# Patient Record
Sex: Female | Born: 1955 | Race: White | Hispanic: No | Smoking: Former smoker
Health system: Southern US, Community
[De-identification: ages and names within clinical notes are randomized; demographics above are authoritative.]

## PROBLEM LIST (undated history)

## (undated) DIAGNOSIS — Z923 Personal history of irradiation: Secondary | ICD-10-CM

## (undated) DIAGNOSIS — E785 Hyperlipidemia, unspecified: Secondary | ICD-10-CM

## (undated) DIAGNOSIS — C801 Malignant (primary) neoplasm, unspecified: Secondary | ICD-10-CM

## (undated) DIAGNOSIS — K219 Gastro-esophageal reflux disease without esophagitis: Secondary | ICD-10-CM

## (undated) DIAGNOSIS — R079 Chest pain, unspecified: Secondary | ICD-10-CM

## (undated) DIAGNOSIS — D869 Sarcoidosis, unspecified: Secondary | ICD-10-CM

## (undated) DIAGNOSIS — I10 Essential (primary) hypertension: Secondary | ICD-10-CM

## (undated) DIAGNOSIS — R0600 Dyspnea, unspecified: Secondary | ICD-10-CM

## (undated) DIAGNOSIS — E079 Disorder of thyroid, unspecified: Secondary | ICD-10-CM

## (undated) DIAGNOSIS — R0609 Other forms of dyspnea: Secondary | ICD-10-CM

## (undated) HISTORY — DX: Personal history of irradiation: Z92.3

## (undated) HISTORY — PX: ABDOMINAL HYSTERECTOMY: SHX81

## (undated) HISTORY — DX: Malignant (primary) neoplasm, unspecified: C80.1

## (undated) HISTORY — DX: Chest pain, unspecified: R07.9

## (undated) HISTORY — DX: Other forms of dyspnea: R06.09

## (undated) HISTORY — DX: Essential (primary) hypertension: I10

## (undated) HISTORY — DX: Hyperlipidemia, unspecified: E78.5

## (undated) HISTORY — DX: Dyspnea, unspecified: R06.00

---

## 2004-06-17 ENCOUNTER — Ambulatory Visit: Payer: Self-pay

## 2004-06-20 ENCOUNTER — Ambulatory Visit: Payer: Self-pay

## 2006-07-01 ENCOUNTER — Encounter: Admission: RE | Admit: 2006-07-01 | Discharge: 2006-07-01 | Payer: Self-pay | Admitting: Family Medicine

## 2006-10-11 ENCOUNTER — Emergency Department (HOSPITAL_COMMUNITY): Admission: EM | Admit: 2006-10-11 | Discharge: 2006-10-11 | Payer: Self-pay | Admitting: Emergency Medicine

## 2007-08-03 ENCOUNTER — Encounter: Admission: RE | Admit: 2007-08-03 | Discharge: 2007-08-03 | Payer: Self-pay | Admitting: Family Medicine

## 2007-08-17 ENCOUNTER — Ambulatory Visit: Payer: Self-pay | Admitting: Cardiology

## 2007-08-30 ENCOUNTER — Ambulatory Visit: Payer: Self-pay

## 2007-08-30 ENCOUNTER — Encounter: Payer: Self-pay | Admitting: Cardiology

## 2008-01-11 ENCOUNTER — Encounter (INDEPENDENT_AMBULATORY_CARE_PROVIDER_SITE_OTHER): Payer: Self-pay | Admitting: Gastroenterology

## 2008-01-11 ENCOUNTER — Ambulatory Visit (HOSPITAL_COMMUNITY): Admission: RE | Admit: 2008-01-11 | Discharge: 2008-01-11 | Payer: Self-pay | Admitting: Gastroenterology

## 2008-04-20 HISTORY — PX: BREAST LUMPECTOMY: SHX2

## 2008-08-08 ENCOUNTER — Emergency Department (HOSPITAL_COMMUNITY): Admission: EM | Admit: 2008-08-08 | Discharge: 2008-08-08 | Payer: Self-pay | Admitting: Emergency Medicine

## 2008-10-02 ENCOUNTER — Ambulatory Visit: Payer: Self-pay | Admitting: Internal Medicine

## 2008-10-17 ENCOUNTER — Ambulatory Visit: Payer: Self-pay | Admitting: Internal Medicine

## 2008-11-14 ENCOUNTER — Ambulatory Visit: Payer: Self-pay | Admitting: Internal Medicine

## 2009-01-31 ENCOUNTER — Encounter: Admission: RE | Admit: 2009-01-31 | Discharge: 2009-01-31 | Payer: Self-pay | Admitting: Family Medicine

## 2009-02-05 ENCOUNTER — Ambulatory Visit: Payer: Self-pay | Admitting: Sports Medicine

## 2009-02-05 DIAGNOSIS — R269 Unspecified abnormalities of gait and mobility: Secondary | ICD-10-CM | POA: Insufficient documentation

## 2009-02-05 DIAGNOSIS — M722 Plantar fascial fibromatosis: Secondary | ICD-10-CM | POA: Insufficient documentation

## 2009-02-20 ENCOUNTER — Encounter (INDEPENDENT_AMBULATORY_CARE_PROVIDER_SITE_OTHER): Payer: Self-pay | Admitting: *Deleted

## 2009-04-20 DIAGNOSIS — Z9221 Personal history of antineoplastic chemotherapy: Secondary | ICD-10-CM

## 2009-04-20 DIAGNOSIS — Z923 Personal history of irradiation: Secondary | ICD-10-CM

## 2009-04-20 HISTORY — DX: Personal history of antineoplastic chemotherapy: Z92.21

## 2009-04-20 HISTORY — DX: Personal history of irradiation: Z92.3

## 2009-06-18 ENCOUNTER — Ambulatory Visit: Payer: Self-pay | Admitting: Sports Medicine

## 2010-02-06 ENCOUNTER — Encounter: Admission: RE | Admit: 2010-02-06 | Discharge: 2010-02-06 | Payer: Self-pay | Admitting: Family Medicine

## 2010-02-13 ENCOUNTER — Encounter (INDEPENDENT_AMBULATORY_CARE_PROVIDER_SITE_OTHER): Payer: Self-pay | Admitting: *Deleted

## 2010-02-19 ENCOUNTER — Encounter: Admission: RE | Admit: 2010-02-19 | Discharge: 2010-02-19 | Payer: Self-pay | Admitting: Family Medicine

## 2010-02-19 DIAGNOSIS — C801 Malignant (primary) neoplasm, unspecified: Secondary | ICD-10-CM | POA: Insufficient documentation

## 2010-02-19 HISTORY — DX: Malignant (primary) neoplasm, unspecified: C80.1

## 2010-02-25 ENCOUNTER — Ambulatory Visit (HOSPITAL_COMMUNITY): Admission: RE | Admit: 2010-02-25 | Discharge: 2010-02-25 | Payer: Self-pay | Admitting: Family Medicine

## 2010-03-04 ENCOUNTER — Ambulatory Visit: Payer: Self-pay | Admitting: Oncology

## 2010-03-17 ENCOUNTER — Ambulatory Visit: Payer: Self-pay | Admitting: Internal Medicine

## 2010-03-17 ENCOUNTER — Encounter: Payer: Self-pay | Admitting: Oncology

## 2010-03-17 ENCOUNTER — Ambulatory Visit (HOSPITAL_COMMUNITY): Admission: RE | Admit: 2010-03-17 | Discharge: 2010-03-17 | Payer: Self-pay | Admitting: Oncology

## 2010-03-17 ENCOUNTER — Ambulatory Visit: Payer: Self-pay

## 2010-03-25 ENCOUNTER — Ambulatory Visit (HOSPITAL_COMMUNITY)
Admission: RE | Admit: 2010-03-25 | Discharge: 2010-03-26 | Payer: Self-pay | Source: Home / Self Care | Attending: General Surgery | Admitting: General Surgery

## 2010-03-25 ENCOUNTER — Encounter (INDEPENDENT_AMBULATORY_CARE_PROVIDER_SITE_OTHER): Payer: Self-pay | Admitting: General Surgery

## 2010-03-25 ENCOUNTER — Encounter: Admission: RE | Admit: 2010-03-25 | Discharge: 2010-03-25 | Payer: Self-pay | Admitting: General Surgery

## 2010-03-25 HISTORY — PX: DEEP AXILLARY SENTINEL NODE BIOPSY / EXCISION: SUR130

## 2010-03-25 HISTORY — PX: BREAST SURGERY: SHX581

## 2010-04-09 ENCOUNTER — Ambulatory Visit: Payer: Self-pay | Admitting: Oncology

## 2010-04-15 ENCOUNTER — Ambulatory Visit (HOSPITAL_COMMUNITY)
Admission: RE | Admit: 2010-04-15 | Discharge: 2010-04-15 | Payer: Self-pay | Source: Home / Self Care | Attending: Oncology | Admitting: Oncology

## 2010-04-18 HISTORY — PX: PORTACATH PLACEMENT: SHX2246

## 2010-04-18 LAB — CBC WITH DIFFERENTIAL/PLATELET
BASO%: 0.4 % (ref 0.0–2.0)
Basophils Absolute: 0 10*3/uL (ref 0.0–0.1)
EOS%: 2.2 % (ref 0.0–7.0)
HCT: 42.9 % (ref 34.8–46.6)
HGB: 14.7 g/dL (ref 11.6–15.9)
LYMPH%: 25.6 % (ref 14.0–49.7)
MCH: 26.6 pg (ref 25.1–34.0)
MCHC: 34.3 g/dL (ref 31.5–36.0)
MCV: 77.6 fL — ABNORMAL LOW (ref 79.5–101.0)
NEUT%: 68.4 % (ref 38.4–76.8)
Platelets: 242 10*3/uL (ref 145–400)
lymph#: 1.7 10*3/uL (ref 0.9–3.3)

## 2010-04-25 ENCOUNTER — Ambulatory Visit (HOSPITAL_BASED_OUTPATIENT_CLINIC_OR_DEPARTMENT_OTHER): Payer: 59 | Admitting: Oncology

## 2010-04-25 LAB — COMPREHENSIVE METABOLIC PANEL
ALT: 51 U/L — ABNORMAL HIGH (ref 0–35)
AST: 28 U/L (ref 0–37)
Albumin: 4.2 g/dL (ref 3.5–5.2)
Alkaline Phosphatase: 58 U/L (ref 39–117)
BUN: 14 mg/dL (ref 6–23)
CO2: 24 mEq/L (ref 19–32)
Calcium: 9.1 mg/dL (ref 8.4–10.5)
Chloride: 103 mEq/L (ref 96–112)
Creatinine, Ser: 0.71 mg/dL (ref 0.40–1.20)
Glucose, Bld: 135 mg/dL — ABNORMAL HIGH (ref 70–99)
Potassium: 4.2 mEq/L (ref 3.5–5.3)
Sodium: 138 mEq/L (ref 135–145)
Total Bilirubin: 0.3 mg/dL (ref 0.3–1.2)
Total Protein: 6.2 g/dL (ref 6.0–8.3)

## 2010-04-25 LAB — CBC WITH DIFFERENTIAL/PLATELET
BASO%: 0.6 % (ref 0.0–2.0)
Basophils Absolute: 0 10*3/uL (ref 0.0–0.1)
EOS%: 4.4 % (ref 0.0–7.0)
Eosinophils Absolute: 0.2 10*3/uL (ref 0.0–0.5)
HCT: 41.2 % (ref 34.8–46.6)
HGB: 14.2 g/dL (ref 11.6–15.9)
LYMPH%: 26.7 % (ref 14.0–49.7)
MCH: 26.6 pg (ref 25.1–34.0)
MCHC: 34.5 g/dL (ref 31.5–36.0)
MCV: 77.2 fL — ABNORMAL LOW (ref 79.5–101.0)
MONO#: 0.1 10*3/uL (ref 0.1–0.9)
MONO%: 2.7 % (ref 0.0–14.0)
NEUT#: 3.1 10*3/uL (ref 1.5–6.5)
NEUT%: 65.6 % (ref 38.4–76.8)
Platelets: 216 10*3/uL (ref 145–400)
RBC: 5.34 10*6/uL (ref 3.70–5.45)
RDW: 14.2 % (ref 11.2–14.5)
WBC: 4.8 10*3/uL (ref 3.9–10.3)
lymph#: 1.3 10*3/uL (ref 0.9–3.3)

## 2010-05-02 LAB — CBC WITH DIFFERENTIAL/PLATELET
BASO%: 0.9 % (ref 0.0–2.0)
Basophils Absolute: 0 10*3/uL (ref 0.0–0.1)
EOS%: 2.3 % (ref 0.0–7.0)
Eosinophils Absolute: 0.1 10*3/uL (ref 0.0–0.5)
HCT: 38.3 % (ref 34.8–46.6)
HGB: 13.1 g/dL (ref 11.6–15.9)
LYMPH%: 31.1 % (ref 14.0–49.7)
MCH: 26.7 pg (ref 25.1–34.0)
MCHC: 34.2 g/dL (ref 31.5–36.0)
MCV: 78 fL — ABNORMAL LOW (ref 79.5–101.0)
MONO#: 0.2 10*3/uL (ref 0.1–0.9)
MONO%: 5 % (ref 0.0–14.0)
NEUT#: 2.7 10*3/uL (ref 1.5–6.5)
NEUT%: 60.7 % (ref 38.4–76.8)
Platelets: 263 10*3/uL (ref 145–400)
RBC: 4.91 10*6/uL (ref 3.70–5.45)
RDW: 14.3 % (ref 11.2–14.5)
WBC: 4.4 10*3/uL (ref 3.9–10.3)
lymph#: 1.4 10*3/uL (ref 0.9–3.3)

## 2010-05-02 LAB — BASIC METABOLIC PANEL
BUN: 13 mg/dL (ref 6–23)
CO2: 26 mEq/L (ref 19–32)
Calcium: 9 mg/dL (ref 8.4–10.5)
Chloride: 103 mEq/L (ref 96–112)
Creatinine, Ser: 0.72 mg/dL (ref 0.40–1.20)
Glucose, Bld: 114 mg/dL — ABNORMAL HIGH (ref 70–99)
Potassium: 3.7 mEq/L (ref 3.5–5.3)
Sodium: 137 mEq/L (ref 135–145)

## 2010-05-09 LAB — CBC WITH DIFFERENTIAL/PLATELET
BASO%: 1.8 % (ref 0.0–2.0)
Basophils Absolute: 0.1 10*3/uL (ref 0.0–0.1)
EOS%: 2.5 % (ref 0.0–7.0)
Eosinophils Absolute: 0.1 10*3/uL (ref 0.0–0.5)
HCT: 40 % (ref 34.8–46.6)
HGB: 13.6 g/dL (ref 11.6–15.9)
LYMPH%: 27.1 % (ref 14.0–49.7)
MCH: 26.6 pg (ref 25.1–34.0)
MCHC: 34 g/dL (ref 31.5–36.0)
MCV: 78.3 fL — ABNORMAL LOW (ref 79.5–101.0)
MONO#: 0.3 10*3/uL (ref 0.1–0.9)
MONO%: 4.9 % (ref 0.0–14.0)
NEUT#: 3.3 10*3/uL (ref 1.5–6.5)
NEUT%: 63.7 % (ref 38.4–76.8)
Platelets: 217 10*3/uL (ref 145–400)
RBC: 5.11 10*6/uL (ref 3.70–5.45)
RDW: 14.8 % — ABNORMAL HIGH (ref 11.2–14.5)
WBC: 5.1 10*3/uL (ref 3.9–10.3)
lymph#: 1.4 10*3/uL (ref 0.9–3.3)
nRBC: 0 % (ref 0–0)

## 2010-05-09 LAB — COMPREHENSIVE METABOLIC PANEL
ALT: 39 U/L — ABNORMAL HIGH (ref 0–35)
AST: 20 U/L (ref 0–37)
Albumin: 4 g/dL (ref 3.5–5.2)
Alkaline Phosphatase: 53 U/L (ref 39–117)
BUN: 14 mg/dL (ref 6–23)
CO2: 25 mEq/L (ref 19–32)
Calcium: 9.1 mg/dL (ref 8.4–10.5)
Chloride: 104 mEq/L (ref 96–112)
Creatinine, Ser: 0.65 mg/dL (ref 0.40–1.20)
Glucose, Bld: 108 mg/dL — ABNORMAL HIGH (ref 70–99)
Potassium: 4.1 mEq/L (ref 3.5–5.3)
Sodium: 138 mEq/L (ref 135–145)
Total Bilirubin: 0.3 mg/dL (ref 0.3–1.2)
Total Protein: 5.9 g/dL — ABNORMAL LOW (ref 6.0–8.3)

## 2010-05-09 LAB — TECHNOLOGIST REVIEW

## 2010-05-16 LAB — CBC WITH DIFFERENTIAL/PLATELET
Eosinophils Absolute: 0.1 10*3/uL (ref 0.0–0.5)
HCT: 36.8 % (ref 34.8–46.6)
HGB: 12.6 g/dL (ref 11.6–15.9)
LYMPH%: 18.6 % (ref 14.0–49.7)
MONO#: 0.3 10*3/uL (ref 0.1–0.9)
NEUT#: 4.4 10*3/uL (ref 1.5–6.5)
NEUT%: 75.4 % (ref 38.4–76.8)
Platelets: 295 10*3/uL (ref 145–400)
WBC: 5.9 10*3/uL (ref 3.9–10.3)

## 2010-05-16 LAB — COMPREHENSIVE METABOLIC PANEL
ALT: 43 U/L — ABNORMAL HIGH (ref 0–35)
BUN: 17 mg/dL (ref 6–23)
CO2: 23 mEq/L (ref 19–32)
Calcium: 9.2 mg/dL (ref 8.4–10.5)
Chloride: 104 mEq/L (ref 96–112)
Creatinine, Ser: 0.81 mg/dL (ref 0.40–1.20)

## 2010-05-16 LAB — TECHNOLOGIST REVIEW

## 2010-05-22 NOTE — Letter (Signed)
Summary: Out of Work  [facility]  [address]   [place], [place] [address]   Phone: [phone]  Fax: [phone]    February 13, 2010   Employee:  ERION HERMAN[employer] [facility]    To Whom It May Concern:   For Medical reasons please allow employee to wear tennis shoes at work through March 2012, and possibly indefinitely.  She will be following up here at our office at that time.   If you need additional information, please feel free to contact our office.         Sincerely,    Dr. Enid Baas

## 2010-05-22 NOTE — Assessment & Plan Note (Signed)
Summary: F/U FOOT,MC   Vital Signs:  Patient profile:   55 year old female Height:      63 inches Weight:      155 pounds BMI:     27.56 BP sitting:   149 / 95  Vitals Entered By: Lillia Pauls CMA (June 18, 2009 11:08 AM)  History of Present Illness: Pt presents for follow-up of bilateral plantar fasciitis, left worse than right. Original exercises, stretching, scaphoid pads and arch binders were helpful at the beginning but she has She has been doing the exercises daily.  Recently developed worsening pain but cannot identify any new changes in her routine or injuries Very painful to step on her left foot in the morning. Notices quite a bit of throbbing and has to take Tylenol as needed pain She has been icing regularly which helps   Allergies (verified): No Known Drug Allergies  Physical Exam  General:  alert and well-developed.   Head:  normocephalic and atraumatic.   Neck:  supple.   Lungs:  normal respiratory effort.   Msk:  Left Foo and Anklet: Normal inspection Full ROM of the ankle + severe TTP along the medial plantar fascia recreating her pain No TTP along the navicular, achilles tendon, medial or lateral maleoli Neg calcaneal squeeze test Difficulty bearing full weight easily 5/5 strength with resisted ankle ROM pes cavus  Right Foot and Ankle: Normal inspection Full ROM of the ankle + moderate TTP along the medial plantar fascia recreating her pain No TTP along the navicular, achilles tendon, medial or lateral maleoli Neg calcaneal squeeze test Difficulty bearing full weight easily 5/5 strength with resisted ankle ROM pes cavus   Additional Exam:  U/S of the left achilles tendon shows significant edema and a partial tear of the plantar fascia. No bone spurs noted. Thickness was 0.96cm. U/S of the right achilles tendon shows mild edema and a partial tear of the plantar fascia. No bone spurs noted. Thickness was 0.68cm. Images saved for documentation.     Impression & Recommendations:  Problem # 1:  PLANTAR FASCIITIS, BILATERAL (ICD-728.71)  Since plantar fasciitis is getting worse, made custom orthotics for the patient.  Patient was fitted for a : standard, cushioned, semi-rigid orthotic. The orthotic was heated and afterward the patient stood on the orthotic blank positioned on the orthotic stand. The patient was positioned in subtalar neutral position and 10 degrees of ankle dorsiflexion in a weight bearing stance. After completion of molding, a stable base was applied to the orthotic blank. The blank was ground to a stable position for weight bearing. Size: 7 red Base: medium blue eva Posting: arch support Additional orthotic padding: none  Use orthotics at all time. Continue stretches, icing and strengthening exercises Return in 6 weeks for follow-up  Orders: [place] LIMITED (16109) Orthotic Materials, each unit (U0454)  Problem # 2:  ABNORMALITY OF GAIT (ICD-781.2)  Orthotics were made because of painful arches and bilateral pes cavus  Orders: [place] LIMITED ([id number]) Orthotic Materials, each unit (B1478)

## 2010-05-23 ENCOUNTER — Encounter (HOSPITAL_BASED_OUTPATIENT_CLINIC_OR_DEPARTMENT_OTHER): Payer: Commercial Managed Care - PPO | Admitting: Oncology

## 2010-05-23 DIAGNOSIS — C50319 Malignant neoplasm of lower-inner quadrant of unspecified female breast: Secondary | ICD-10-CM

## 2010-05-23 DIAGNOSIS — Z5111 Encounter for antineoplastic chemotherapy: Secondary | ICD-10-CM

## 2010-05-23 DIAGNOSIS — Z5112 Encounter for antineoplastic immunotherapy: Secondary | ICD-10-CM

## 2010-05-23 LAB — CBC WITH DIFFERENTIAL/PLATELET
BASO%: 2 % (ref 0.0–2.0)
HCT: 41 % (ref 34.8–46.6)
MCHC: 33.9 g/dL (ref 31.5–36.0)
MONO#: 0.4 10*3/uL (ref 0.1–0.9)
NEUT%: 67 % (ref 38.4–76.8)
RBC: 5.12 10*6/uL (ref 3.70–5.45)
RDW: 15.8 % — ABNORMAL HIGH (ref 11.2–14.5)
WBC: 7.7 10*3/uL (ref 3.9–10.3)
lymph#: 1.9 10*3/uL (ref 0.9–3.3)

## 2010-05-23 LAB — COMPREHENSIVE METABOLIC PANEL
ALT: 23 U/L (ref 0–35)
AST: 15 U/L (ref 0–37)
Albumin: 4.6 g/dL (ref 3.5–5.2)
Calcium: 9.8 mg/dL (ref 8.4–10.5)
Chloride: 101 mEq/L (ref 96–112)
Potassium: 3.5 mEq/L (ref 3.5–5.3)
Sodium: 140 mEq/L (ref 135–145)
Total Protein: 6.6 g/dL (ref 6.0–8.3)

## 2010-05-23 LAB — TECHNOLOGIST REVIEW

## 2010-05-30 ENCOUNTER — Other Ambulatory Visit: Payer: Self-pay | Admitting: Physician Assistant

## 2010-05-30 ENCOUNTER — Encounter (HOSPITAL_BASED_OUTPATIENT_CLINIC_OR_DEPARTMENT_OTHER): Payer: Commercial Managed Care - PPO | Admitting: Oncology

## 2010-05-30 DIAGNOSIS — C50319 Malignant neoplasm of lower-inner quadrant of unspecified female breast: Secondary | ICD-10-CM

## 2010-05-30 DIAGNOSIS — Z5111 Encounter for antineoplastic chemotherapy: Secondary | ICD-10-CM

## 2010-05-30 DIAGNOSIS — Z5112 Encounter for antineoplastic immunotherapy: Secondary | ICD-10-CM

## 2010-05-30 LAB — CBC WITH DIFFERENTIAL/PLATELET
BASO%: 0.5 % (ref 0.0–2.0)
EOS%: 2.1 % (ref 0.0–7.0)
MCH: 28.2 pg (ref 25.1–34.0)
MCHC: 34.7 g/dL (ref 31.5–36.0)
MCV: 81.3 fL (ref 79.5–101.0)
MONO%: 4.7 % (ref 0.0–14.0)
RDW: 17.3 % — ABNORMAL HIGH (ref 11.2–14.5)
lymph#: 1.2 10*3/uL (ref 0.9–3.3)

## 2010-05-30 LAB — COMPREHENSIVE METABOLIC PANEL
ALT: 25 U/L (ref 0–35)
AST: 14 U/L (ref 0–37)
Albumin: 4.2 g/dL (ref 3.5–5.2)
Alkaline Phosphatase: 46 U/L (ref 39–117)
Calcium: 9.4 mg/dL (ref 8.4–10.5)
Chloride: 106 mEq/L (ref 96–112)
Creatinine, Ser: 0.73 mg/dL (ref 0.40–1.20)
Potassium: 4 mEq/L (ref 3.5–5.3)

## 2010-06-05 ENCOUNTER — Ambulatory Visit: Payer: 59 | Admitting: Radiation Oncology

## 2010-06-06 ENCOUNTER — Other Ambulatory Visit: Payer: Self-pay | Admitting: Oncology

## 2010-06-06 ENCOUNTER — Ambulatory Visit: Payer: 59 | Attending: Radiation Oncology | Admitting: Radiation Oncology

## 2010-06-06 ENCOUNTER — Encounter (HOSPITAL_BASED_OUTPATIENT_CLINIC_OR_DEPARTMENT_OTHER): Payer: 59 | Admitting: Oncology

## 2010-06-06 DIAGNOSIS — C50319 Malignant neoplasm of lower-inner quadrant of unspecified female breast: Secondary | ICD-10-CM

## 2010-06-06 DIAGNOSIS — Z90710 Acquired absence of both cervix and uterus: Secondary | ICD-10-CM | POA: Insufficient documentation

## 2010-06-06 DIAGNOSIS — Z8052 Family history of malignant neoplasm of bladder: Secondary | ICD-10-CM | POA: Insufficient documentation

## 2010-06-06 DIAGNOSIS — Z171 Estrogen receptor negative status [ER-]: Secondary | ICD-10-CM | POA: Insufficient documentation

## 2010-06-06 DIAGNOSIS — Z5111 Encounter for antineoplastic chemotherapy: Secondary | ICD-10-CM

## 2010-06-06 DIAGNOSIS — Z510 Encounter for antineoplastic radiation therapy: Secondary | ICD-10-CM | POA: Insufficient documentation

## 2010-06-06 DIAGNOSIS — C50919 Malignant neoplasm of unspecified site of unspecified female breast: Secondary | ICD-10-CM | POA: Insufficient documentation

## 2010-06-06 DIAGNOSIS — Z9079 Acquired absence of other genital organ(s): Secondary | ICD-10-CM | POA: Insufficient documentation

## 2010-06-06 DIAGNOSIS — Z5112 Encounter for antineoplastic immunotherapy: Secondary | ICD-10-CM

## 2010-06-06 DIAGNOSIS — Z79899 Other long term (current) drug therapy: Secondary | ICD-10-CM | POA: Insufficient documentation

## 2010-06-06 LAB — CBC WITH DIFFERENTIAL/PLATELET
BASO%: 0.6 % (ref 0.0–2.0)
Basophils Absolute: 0 10*3/uL (ref 0.0–0.1)
EOS%: 2.1 % (ref 0.0–7.0)
HCT: 37.8 % (ref 34.8–46.6)
HGB: 13.4 g/dL (ref 11.6–15.9)
LYMPH%: 18 % (ref 14.0–49.7)
MCH: 28.7 pg (ref 25.1–34.0)
MCHC: 35.4 g/dL (ref 31.5–36.0)
MCV: 81.2 fL (ref 79.5–101.0)
MONO%: 2.9 % (ref 0.0–14.0)
NEUT%: 76.4 % (ref 38.4–76.8)
lymph#: 1 10*3/uL (ref 0.9–3.3)

## 2010-06-06 LAB — COMPREHENSIVE METABOLIC PANEL
ALT: 23 U/L (ref 0–35)
AST: 12 U/L (ref 0–37)
Alkaline Phosphatase: 47 U/L (ref 39–117)
BUN: 14 mg/dL (ref 6–23)
Calcium: 8.8 mg/dL (ref 8.4–10.5)
Chloride: 105 mEq/L (ref 96–112)
Creatinine, Ser: 0.66 mg/dL (ref 0.40–1.20)
Total Bilirubin: 0.3 mg/dL (ref 0.3–1.2)

## 2010-06-13 ENCOUNTER — Other Ambulatory Visit: Payer: Self-pay | Admitting: Oncology

## 2010-06-13 ENCOUNTER — Encounter (HOSPITAL_BASED_OUTPATIENT_CLINIC_OR_DEPARTMENT_OTHER): Payer: PRIVATE HEALTH INSURANCE | Admitting: Oncology

## 2010-06-13 DIAGNOSIS — Z171 Estrogen receptor negative status [ER-]: Secondary | ICD-10-CM

## 2010-06-13 DIAGNOSIS — Z5111 Encounter for antineoplastic chemotherapy: Secondary | ICD-10-CM

## 2010-06-13 DIAGNOSIS — Z5112 Encounter for antineoplastic immunotherapy: Secondary | ICD-10-CM

## 2010-06-13 DIAGNOSIS — C50319 Malignant neoplasm of lower-inner quadrant of unspecified female breast: Secondary | ICD-10-CM

## 2010-06-13 LAB — CBC WITH DIFFERENTIAL/PLATELET
Basophils Absolute: 0.1 10*3/uL (ref 0.0–0.1)
EOS%: 2.9 % (ref 0.0–7.0)
Eosinophils Absolute: 0.2 10*3/uL (ref 0.0–0.5)
LYMPH%: 18.1 % (ref 14.0–49.7)
MCH: 27.9 pg (ref 25.1–34.0)
MCV: 80 fL (ref 79.5–101.0)
MONO%: 4.5 % (ref 0.0–14.0)
NEUT#: 3.7 10*3/uL (ref 1.5–6.5)
Platelets: 263 10*3/uL (ref 145–400)
RBC: 4.81 10*6/uL (ref 3.70–5.45)
RDW: 17 % — ABNORMAL HIGH (ref 11.2–14.5)
nRBC: 0 % (ref 0–0)

## 2010-06-13 LAB — COMPREHENSIVE METABOLIC PANEL
Alkaline Phosphatase: 48 U/L (ref 39–117)
CO2: 23 mEq/L (ref 19–32)
Creatinine, Ser: 0.84 mg/dL (ref 0.40–1.20)
Glucose, Bld: 143 mg/dL — ABNORMAL HIGH (ref 70–99)
Total Bilirubin: 0.5 mg/dL (ref 0.3–1.2)

## 2010-06-17 ENCOUNTER — Other Ambulatory Visit: Payer: Self-pay | Admitting: Radiation Oncology

## 2010-06-20 ENCOUNTER — Encounter (HOSPITAL_BASED_OUTPATIENT_CLINIC_OR_DEPARTMENT_OTHER): Payer: Commercial Managed Care - PPO | Admitting: Oncology

## 2010-06-20 ENCOUNTER — Other Ambulatory Visit: Payer: Self-pay | Admitting: Physician Assistant

## 2010-06-20 DIAGNOSIS — Z171 Estrogen receptor negative status [ER-]: Secondary | ICD-10-CM

## 2010-06-20 DIAGNOSIS — Z5112 Encounter for antineoplastic immunotherapy: Secondary | ICD-10-CM

## 2010-06-20 DIAGNOSIS — Z5111 Encounter for antineoplastic chemotherapy: Secondary | ICD-10-CM

## 2010-06-20 DIAGNOSIS — C50319 Malignant neoplasm of lower-inner quadrant of unspecified female breast: Secondary | ICD-10-CM

## 2010-06-20 LAB — CBC WITH DIFFERENTIAL/PLATELET
Eosinophils Absolute: 0.1 10*3/uL (ref 0.0–0.5)
HCT: 36.9 % (ref 34.8–46.6)
LYMPH%: 15.2 % (ref 14.0–49.7)
MCHC: 34.4 g/dL (ref 31.5–36.0)
MCV: 81.6 fL (ref 79.5–101.0)
MONO#: 0.4 10*3/uL (ref 0.1–0.9)
MONO%: 7.2 % (ref 0.0–14.0)
NEUT%: 74.4 % (ref 38.4–76.8)
Platelets: 288 10*3/uL (ref 145–400)
RBC: 4.52 10*6/uL (ref 3.70–5.45)
WBC: 5.1 10*3/uL (ref 3.9–10.3)

## 2010-06-20 LAB — TECHNOLOGIST REVIEW

## 2010-06-27 ENCOUNTER — Other Ambulatory Visit: Payer: Self-pay | Admitting: Physician Assistant

## 2010-06-27 ENCOUNTER — Ambulatory Visit (HOSPITAL_COMMUNITY): Payer: 59 | Attending: Oncology

## 2010-06-27 ENCOUNTER — Encounter (HOSPITAL_BASED_OUTPATIENT_CLINIC_OR_DEPARTMENT_OTHER): Payer: 59 | Admitting: Oncology

## 2010-06-27 ENCOUNTER — Encounter: Payer: Self-pay | Admitting: Cardiology

## 2010-06-27 DIAGNOSIS — C50319 Malignant neoplasm of lower-inner quadrant of unspecified female breast: Secondary | ICD-10-CM

## 2010-06-27 DIAGNOSIS — I428 Other cardiomyopathies: Secondary | ICD-10-CM | POA: Insufficient documentation

## 2010-06-27 DIAGNOSIS — Z5112 Encounter for antineoplastic immunotherapy: Secondary | ICD-10-CM

## 2010-06-27 DIAGNOSIS — I059 Rheumatic mitral valve disease, unspecified: Secondary | ICD-10-CM | POA: Insufficient documentation

## 2010-06-27 DIAGNOSIS — Z5111 Encounter for antineoplastic chemotherapy: Secondary | ICD-10-CM

## 2010-06-27 LAB — CBC WITH DIFFERENTIAL/PLATELET
BASO%: 0.9 % (ref 0.0–2.0)
Basophils Absolute: 0.1 10*3/uL (ref 0.0–0.1)
EOS%: 1.3 % (ref 0.0–7.0)
HCT: 38.3 % (ref 34.8–46.6)
HGB: 13 g/dL (ref 11.6–15.9)
LYMPH%: 13.5 % — ABNORMAL LOW (ref 14.0–49.7)
MCH: 28.2 pg (ref 25.1–34.0)
MCHC: 33.9 g/dL (ref 31.5–36.0)
MCV: 83.1 fL (ref 79.5–101.0)
MONO%: 11.2 % (ref 0.0–14.0)
NEUT%: 73.1 % (ref 38.4–76.8)

## 2010-07-01 LAB — DIFFERENTIAL
Eosinophils Relative: 3 % (ref 0–5)
Lymphocytes Relative: 22 % (ref 12–46)
Lymphs Abs: 1.9 10*3/uL (ref 0.7–4.0)
Monocytes Relative: 7 % (ref 3–12)

## 2010-07-01 LAB — URINALYSIS, ROUTINE W REFLEX MICROSCOPIC
Glucose, UA: NEGATIVE mg/dL
Nitrite: NEGATIVE
Protein, ur: NEGATIVE mg/dL
Urobilinogen, UA: 0.2 mg/dL (ref 0.0–1.0)

## 2010-07-01 LAB — COMPREHENSIVE METABOLIC PANEL
AST: 18 U/L (ref 0–37)
Albumin: 4.3 g/dL (ref 3.5–5.2)
CO2: 30 mEq/L (ref 19–32)
Calcium: 9.5 mg/dL (ref 8.4–10.5)
Creatinine, Ser: 0.83 mg/dL (ref 0.4–1.2)
GFR calc Af Amer: >60 mL/min (ref >60)
GFR calc non Af Amer: >60 mL/min (ref >60)
Sodium: 138 mEq/L (ref 135–145)
Total Protein: 6.7 g/dL (ref 6.0–8.3)

## 2010-07-01 LAB — SURGICAL PCR SCREEN: MRSA, PCR: NEGATIVE

## 2010-07-01 LAB — CBC
MCH: 26.7 pg (ref 26.0–34.0)
MCHC: 33.8 g/dL (ref 30.0–36.0)
Platelets: 263 10*3/uL (ref 150–400)
RDW: 14.1 % (ref 11.5–15.5)

## 2010-07-04 ENCOUNTER — Encounter (HOSPITAL_BASED_OUTPATIENT_CLINIC_OR_DEPARTMENT_OTHER): Payer: 59 | Admitting: Oncology

## 2010-07-04 ENCOUNTER — Other Ambulatory Visit: Payer: Self-pay | Admitting: Physician Assistant

## 2010-07-04 DIAGNOSIS — C50319 Malignant neoplasm of lower-inner quadrant of unspecified female breast: Secondary | ICD-10-CM

## 2010-07-04 DIAGNOSIS — Z171 Estrogen receptor negative status [ER-]: Secondary | ICD-10-CM

## 2010-07-04 DIAGNOSIS — Z5111 Encounter for antineoplastic chemotherapy: Secondary | ICD-10-CM

## 2010-07-04 DIAGNOSIS — Z5112 Encounter for antineoplastic immunotherapy: Secondary | ICD-10-CM

## 2010-07-04 LAB — CBC WITH DIFFERENTIAL/PLATELET
BASO%: 0.6 % (ref 0.0–2.0)
EOS%: 1.4 % (ref 0.0–7.0)
HCT: 40.8 % (ref 34.8–46.6)
MCH: 28.3 pg (ref 25.1–34.0)
MCHC: 33.8 g/dL (ref 31.5–36.0)
MONO#: 0.6 10*3/uL (ref 0.1–0.9)
RBC: 4.88 10*6/uL (ref 3.70–5.45)
RDW: 16.4 % — ABNORMAL HIGH (ref 11.2–14.5)
WBC: 7.8 10*3/uL (ref 3.9–10.3)
lymph#: 1.2 10*3/uL (ref 0.9–3.3)
nRBC: 0 % (ref 0–0)

## 2010-07-11 ENCOUNTER — Other Ambulatory Visit: Payer: Self-pay | Admitting: Physician Assistant

## 2010-07-11 ENCOUNTER — Encounter (HOSPITAL_BASED_OUTPATIENT_CLINIC_OR_DEPARTMENT_OTHER): Payer: 59 | Admitting: Oncology

## 2010-07-11 DIAGNOSIS — C50319 Malignant neoplasm of lower-inner quadrant of unspecified female breast: Secondary | ICD-10-CM

## 2010-07-11 DIAGNOSIS — Z5112 Encounter for antineoplastic immunotherapy: Secondary | ICD-10-CM

## 2010-07-11 DIAGNOSIS — Z171 Estrogen receptor negative status [ER-]: Secondary | ICD-10-CM

## 2010-07-11 DIAGNOSIS — Z5111 Encounter for antineoplastic chemotherapy: Secondary | ICD-10-CM

## 2010-07-11 LAB — CBC WITH DIFFERENTIAL/PLATELET
Basophils Absolute: 0.1 10*3/uL (ref 0.0–0.1)
Eosinophils Absolute: 0.3 10*3/uL (ref 0.0–0.5)
HGB: 14.5 g/dL (ref 11.6–15.9)
NEUT#: 6.7 10*3/uL — ABNORMAL HIGH (ref 1.5–6.5)
RBC: 5.11 10*6/uL (ref 3.70–5.45)
RDW: 15.5 % — ABNORMAL HIGH (ref 11.2–14.5)
WBC: 8.4 10*3/uL (ref 3.9–10.3)
lymph#: 0.8 10*3/uL — ABNORMAL LOW (ref 0.9–3.3)
nRBC: 0 % (ref 0–0)

## 2010-07-25 ENCOUNTER — Other Ambulatory Visit: Payer: Self-pay | Admitting: Physician Assistant

## 2010-07-25 ENCOUNTER — Encounter (HOSPITAL_BASED_OUTPATIENT_CLINIC_OR_DEPARTMENT_OTHER): Payer: 59 | Admitting: Oncology

## 2010-07-25 DIAGNOSIS — Z5112 Encounter for antineoplastic immunotherapy: Secondary | ICD-10-CM

## 2010-07-25 DIAGNOSIS — Z171 Estrogen receptor negative status [ER-]: Secondary | ICD-10-CM

## 2010-07-25 DIAGNOSIS — C50319 Malignant neoplasm of lower-inner quadrant of unspecified female breast: Secondary | ICD-10-CM

## 2010-07-25 DIAGNOSIS — Z5111 Encounter for antineoplastic chemotherapy: Secondary | ICD-10-CM

## 2010-07-25 LAB — CBC WITH DIFFERENTIAL/PLATELET
BASO%: 0.9 % (ref 0.0–2.0)
Eosinophils Absolute: 0.2 10*3/uL (ref 0.0–0.5)
HCT: 40.4 % (ref 34.8–46.6)
LYMPH%: 15.6 % (ref 14.0–49.7)
MCHC: 33.9 g/dL (ref 31.5–36.0)
MONO#: 0.5 10*3/uL (ref 0.1–0.9)
NEUT#: 4.7 10*3/uL (ref 1.5–6.5)
Platelets: 229 10*3/uL (ref 145–400)
RBC: 4.83 10*6/uL (ref 3.70–5.45)
WBC: 6.5 10*3/uL (ref 3.9–10.3)
lymph#: 1 10*3/uL (ref 0.9–3.3)

## 2010-07-25 LAB — COMPREHENSIVE METABOLIC PANEL
ALT: 50 U/L — ABNORMAL HIGH (ref 0–35)
Albumin: 3.8 g/dL (ref 3.5–5.2)
CO2: 28 mEq/L (ref 19–32)
Calcium: 9.1 mg/dL (ref 8.4–10.5)
Chloride: 105 mEq/L (ref 96–112)
Glucose, Bld: 89 mg/dL (ref 70–99)
Sodium: 140 mEq/L (ref 135–145)
Total Bilirubin: 0.6 mg/dL (ref 0.3–1.2)
Total Protein: 6.4 g/dL (ref 6.0–8.3)

## 2010-08-01 ENCOUNTER — Other Ambulatory Visit: Payer: Self-pay | Admitting: Physician Assistant

## 2010-08-01 ENCOUNTER — Encounter (HOSPITAL_BASED_OUTPATIENT_CLINIC_OR_DEPARTMENT_OTHER): Payer: 59 | Admitting: Oncology

## 2010-08-01 DIAGNOSIS — Z5111 Encounter for antineoplastic chemotherapy: Secondary | ICD-10-CM

## 2010-08-01 DIAGNOSIS — C50319 Malignant neoplasm of lower-inner quadrant of unspecified female breast: Secondary | ICD-10-CM

## 2010-08-01 DIAGNOSIS — Z171 Estrogen receptor negative status [ER-]: Secondary | ICD-10-CM

## 2010-08-01 DIAGNOSIS — Z5112 Encounter for antineoplastic immunotherapy: Secondary | ICD-10-CM

## 2010-08-01 LAB — CBC WITH DIFFERENTIAL/PLATELET
BASO%: 0.2 % (ref 0.0–2.0)
Eosinophils Absolute: 0.1 10*3/uL (ref 0.0–0.5)
LYMPH%: 14.5 % (ref 14.0–49.7)
MCHC: 34 g/dL (ref 31.5–36.0)
MONO#: 0.2 10*3/uL (ref 0.1–0.9)
NEUT#: 3.7 10*3/uL (ref 1.5–6.5)
RBC: 4.89 10*6/uL (ref 3.70–5.45)
RDW: 14.6 % — ABNORMAL HIGH (ref 11.2–14.5)
WBC: 4.7 10*3/uL (ref 3.9–10.3)
lymph#: 0.7 10*3/uL — ABNORMAL LOW (ref 0.9–3.3)

## 2010-08-04 ENCOUNTER — Other Ambulatory Visit: Payer: Self-pay | Admitting: Radiation Oncology

## 2010-08-04 ENCOUNTER — Encounter: Payer: 59 | Admitting: Oncology

## 2010-08-04 LAB — RESEARCH LABS

## 2010-08-08 ENCOUNTER — Other Ambulatory Visit: Payer: Self-pay | Admitting: Physician Assistant

## 2010-08-08 ENCOUNTER — Encounter (HOSPITAL_BASED_OUTPATIENT_CLINIC_OR_DEPARTMENT_OTHER): Payer: 59 | Admitting: Oncology

## 2010-08-08 DIAGNOSIS — C50319 Malignant neoplasm of lower-inner quadrant of unspecified female breast: Secondary | ICD-10-CM

## 2010-08-08 DIAGNOSIS — Z5112 Encounter for antineoplastic immunotherapy: Secondary | ICD-10-CM

## 2010-08-08 DIAGNOSIS — Z5111 Encounter for antineoplastic chemotherapy: Secondary | ICD-10-CM

## 2010-08-08 DIAGNOSIS — Z171 Estrogen receptor negative status [ER-]: Secondary | ICD-10-CM

## 2010-08-08 LAB — COMPREHENSIVE METABOLIC PANEL
ALT: 26 U/L (ref 0–35)
AST: 18 U/L (ref 0–37)
Albumin: 3.8 g/dL (ref 3.5–5.2)
BUN: 12 mg/dL (ref 6–23)
CO2: 28 mEq/L (ref 19–32)
Calcium: 9 mg/dL (ref 8.4–10.5)
Chloride: 106 mEq/L (ref 96–112)
Potassium: 3.9 mEq/L (ref 3.5–5.3)

## 2010-08-08 LAB — CBC WITH DIFFERENTIAL/PLATELET
Basophils Absolute: 0 10*3/uL (ref 0.0–0.1)
EOS%: 2.7 % (ref 0.0–7.0)
HCT: 41.3 % (ref 34.8–46.6)
HGB: 14.1 g/dL (ref 11.6–15.9)
MCH: 28.4 pg (ref 25.1–34.0)
MONO#: 0.4 10*3/uL (ref 0.1–0.9)
NEUT#: 3.5 10*3/uL (ref 1.5–6.5)
RDW: 13.2 % (ref 11.2–14.5)
WBC: 5.1 10*3/uL (ref 3.9–10.3)
lymph#: 1.1 10*3/uL (ref 0.9–3.3)

## 2010-08-29 ENCOUNTER — Ambulatory Visit: Payer: 59 | Attending: Radiation Oncology | Admitting: Radiation Oncology

## 2010-08-29 ENCOUNTER — Encounter (HOSPITAL_BASED_OUTPATIENT_CLINIC_OR_DEPARTMENT_OTHER): Payer: 59 | Admitting: Oncology

## 2010-08-29 ENCOUNTER — Other Ambulatory Visit: Payer: Self-pay | Admitting: Physician Assistant

## 2010-08-29 DIAGNOSIS — Z171 Estrogen receptor negative status [ER-]: Secondary | ICD-10-CM

## 2010-08-29 DIAGNOSIS — Z5112 Encounter for antineoplastic immunotherapy: Secondary | ICD-10-CM

## 2010-08-29 DIAGNOSIS — C50319 Malignant neoplasm of lower-inner quadrant of unspecified female breast: Secondary | ICD-10-CM

## 2010-08-29 LAB — COMPREHENSIVE METABOLIC PANEL
ALT: 31 U/L (ref 0–35)
AST: 20 U/L (ref 0–37)
Albumin: 4.3 g/dL (ref 3.5–5.2)
Calcium: 9.2 mg/dL (ref 8.4–10.5)
Chloride: 104 mEq/L (ref 96–112)
Potassium: 4.2 mEq/L (ref 3.5–5.3)
Sodium: 141 mEq/L (ref 135–145)
Total Protein: 6.3 g/dL (ref 6.0–8.3)

## 2010-08-29 LAB — CBC WITH DIFFERENTIAL/PLATELET
BASO%: 0.9 % (ref 0.0–2.0)
EOS%: 2.4 % (ref 0.0–7.0)
HGB: 14.6 g/dL (ref 11.6–15.9)
MCH: 28.2 pg (ref 25.1–34.0)
MCHC: 34.4 g/dL (ref 31.5–36.0)
RDW: 12.6 % (ref 11.2–14.5)
WBC: 5.4 10*3/uL (ref 3.9–10.3)
lymph#: 1.3 10*3/uL (ref 0.9–3.3)

## 2010-09-02 NOTE — Op Note (Signed)
NAME:  Tracy Barnes, Tracy Barnes              ACCOUNT NO.:  [id number]   MEDICAL RECORD NO.:  [id number]          PATIENT TYPE:  AMB   LOCATION:  ENDO                         FACILITY:  [facility]   PHYSICIAN:  Anselmo Rod, M.D.  DATE OF BIRTH:  [birth year]   DATE OF PROCEDURE:  01/11/2008  DATE OF DISCHARGE:  01/11/2008                               OPERATIVE REPORT   PROCEDURE PERFORMED:  Colonoscopy with multiple random colonic biopsies.   ENDOSCOPIST:  Anselmo Rod, M.D.   INSTRUMENT USED:  Pentax video colonoscope.   INDICATIONS FOR PROCEDURE:  A 55 year old white female with history of a  change in bowel habits undergoing screening colonoscopy to rule out  colonic polyps, masses, etc.   PRE PROCEDURE PREPARATION:  Informed consent was procured from the  patient.  The patient fasted for 8 hours prior to the procedure and  prepped with a bottle of magnesium citrate and a gallon of NuLytely the  night prior to the procedure.  Risks and benefits of the procedure  including a 10% missed rate of cancer and polyp were discussed with the  patient as well.   PRE PROCEDURE PHYSICAL:  The patient had stable vital signs.  Neck  supple.  Chest clear to auscultation.  S1, S2 regular.  Abdomen soft  with normal bowel sounds.   DESCRIPTION OF PROCEDURE:  The patient was placed in the left lateral  decubitus position and sedated with 75 mcg of Fentanyl and 6 mg of  Versed given intravenously in slow incremental doses.  Once the patient  was adequately sedated and maintained on low-flow oxygen and continuous  cardiac monitoring the Pentax video colonoscope was advanced from the  rectum to the cecum.  Small sessile polyp was removed by cold biopsy  from the right colon.  There was evidence of sigmoid diverticulosis.  TI  erosions were biopsied as well to rule out IBD.  Random colonic biopsies  were done to rule out microscopic versus lymphocytic colitis.  Retroflexion in the rectum revealed no  abnormalities.  The patient  tolerated the procedure well without immediate complications.   IMPRESSION:  1. Scattered sigmoid diverticula.  2. Small sessile polyp removed by cold biopsy from the right colon.  3. Erosions in the terminal ileum biopsied for pathology.  4. Random colonic biopsies done to rule out microscopic versus      lymphocytic colitis.   RECOMMENDATIONS:  1. Await pathology results.  2. Avoid all nonsteroidals including aspirin for the next 2 weeks.  3. Repeat colonoscopy depending on pathology results.  4. Outpatient followup if need arises in the future.  5. Importance of a high-fiber diet with liberal fluid intake has been      emphasized.      Anselmo Rod, M.D.  Electronically Signed     JNM/MEDQ  D:  01/13/2008  T:  01/14/2008  Job:  [id number]

## 2010-09-02 NOTE — Assessment & Plan Note (Signed)
[facility]                            CARDIOLOGY OFFICE NOTE   NAME:Butz, LEANN MAYWEATHER                     MRN:          [id number]  DATE:08/17/2007                            DOB:          12/03/1955    HISTORY OF PRESENT ILLNESS:  Ms. Reitman is a pleasant 55 year old female who I am asked to evaluate  for an abnormal electrocardiogram and dyspnea.  She has no prior cardiac  history.  Over the past 6 months, she has noticed that she gets dyspneic  with more moderate activities such as walking a mile or climbing stairs.  This is unusual for her.  Note there is no orthopnea or PND.  She can  occasionally have mild pedal edema for the past 6 months, as well.  Note, she has not had exertional chest pain, presyncope or syncope.  She  occasionally feels her heart skipping, but this does not appear to be  significantly bothersome.  She was seen by Dr. Elease Hashimoto.  Electrocardiogram was felt to be different from previous and abnormal.  We were asked to further evaluate.   MEDICATIONS:  Premarin.   ALLERGY:  CODEINE.   SOCIAL HISTORY:  She does not smoke nor does she consume alcohol.   FAMILY HISTORY:  Negative for coronary artery disease.   PAST MEDICAL HISTORY:  There is no diabetes mellitus, hypertension or  hyperlipidemia.  There is no other medical problems noted.  She has had  a prior hysterectomy secondary to fibroids as well as an appendectomy.   REVIEW OF SYSTEMS:  She denies any headaches or fevers, chills.  There  is no productive cough or hemoptysis.  There is no dysphagia,  odynophagia, melena or hematochezia.  There is no dysuria or hematuria.  There are no rashes or seizure activity.  There is no orthopnea, PND.  She does occasionally have mild pedal edema by her report.  This has  been present for 6 months.  She also occasionally has some arthralgias  in the knees.  The remaining systems are negative other than a weight  gain  approximately 10 pounds over the past one year.   PHYSICAL EXAMINATION:  VITAL SIGNS:  Blood pressure 130/80.  Her pulse  is 80.  She weighs 177 pounds.  She is well-developed, somewhat obese.  GENERAL:  She is in no acute distress at present.  Skin is warm and dry.  She does not appear depressed.  There is no peripheral clubbing.  Her  back is normal.  HEENT:  Normal with no mileage.  NECK:  Supple with normal upstroke bilaterally.  No bruits noted.  There  is no jugular distention and no thyromegaly is noted.  CHEST:  Clear to auscultation, no expansion.  CARDIOVASCULAR:  Regular rhythm.  Normal S1-S2.  There is no murmurs,  rubs or gallops noted.  There is no change in Valsalva.  ABDOMEN:  Nontender, nondistended.  Positive bowel sounds.  No  hepatosplenomegaly.  No mass appreciated.  There is no abdominal bruit.  She has 2+ femoral pulses bilaterally.  No bruits.  EXTREMITIES:  Show no edema. I  can palpate no cords.  She has 2+  posterior tibial pulses bilaterally.  NEUROLOGICAL:  Grossly intact.   STUDIES:  Electrocardiogram from Dr. Santiago Bur office shows a sinus  rhythm at a rate of 78.  There is left ventricle hypertrophy with  nonspecific ST changes.   DIAGNOSES:  1. Dyspnea.  The etiology of this is unclear.  She does not have      classic ischemic symptoms, but we will plan to proceed with stress      echocardiogram both to quantify LV function and also to exclude      ischemia.  I think if this is normal then she will most likely not      need further cardiac workup.  Note, there is no significant history      of tobacco use.  She also has had no recent travel or leg injury,      and I think pulmonary embolus is unlikely.  Some of this may be      deconditioning in size.  If her stress echocardiogram is normal,      then I will ask her to follow up with Dr. Elease Hashimoto for further      management.  2. Abnormal electrocardiogram.  She does have left ventricle       hypertrophy, biatrial enlargement and nonspecific ST changes on      electrocardiogram.  The echocardiogram will help [place] to better      assess her LV dimensions, left atrial dimensions and left      ventricular wall thickness.  She apparently does not have a history      of hypertension.   I will see back on as-needed basis pending the results of her stress  echo.     Madolyn Frieze Jens Som, MD, [facility]  Electronically Signed    BSC/MedQ  DD: 08/17/2007  DT: 08/17/2007  Job #: [id number]   cc:   Lorie Phenix

## 2010-09-19 ENCOUNTER — Encounter (INDEPENDENT_AMBULATORY_CARE_PROVIDER_SITE_OTHER): Payer: Self-pay | Admitting: General Surgery

## 2010-09-19 ENCOUNTER — Encounter (HOSPITAL_BASED_OUTPATIENT_CLINIC_OR_DEPARTMENT_OTHER): Payer: 59 | Admitting: Oncology

## 2010-09-19 ENCOUNTER — Other Ambulatory Visit: Payer: Self-pay | Admitting: Physician Assistant

## 2010-09-19 DIAGNOSIS — D229 Melanocytic nevi, unspecified: Secondary | ICD-10-CM | POA: Insufficient documentation

## 2010-09-19 DIAGNOSIS — Z973 Presence of spectacles and contact lenses: Secondary | ICD-10-CM | POA: Insufficient documentation

## 2010-09-19 DIAGNOSIS — N63 Unspecified lump in unspecified breast: Secondary | ICD-10-CM | POA: Insufficient documentation

## 2010-09-19 DIAGNOSIS — Z5112 Encounter for antineoplastic immunotherapy: Secondary | ICD-10-CM

## 2010-09-19 DIAGNOSIS — Z171 Estrogen receptor negative status [ER-]: Secondary | ICD-10-CM

## 2010-09-19 DIAGNOSIS — Z79890 Hormone replacement therapy (postmenopausal): Secondary | ICD-10-CM

## 2010-09-19 DIAGNOSIS — D0510 Intraductal carcinoma in situ of unspecified breast: Secondary | ICD-10-CM

## 2010-09-19 DIAGNOSIS — C50319 Malignant neoplasm of lower-inner quadrant of unspecified female breast: Secondary | ICD-10-CM

## 2010-09-19 DIAGNOSIS — Z780 Asymptomatic menopausal state: Secondary | ICD-10-CM | POA: Insufficient documentation

## 2010-09-19 LAB — COMPREHENSIVE METABOLIC PANEL
ALT: 34 U/L (ref 0–35)
AST: 15 U/L (ref 0–37)
Calcium: 9.7 mg/dL (ref 8.4–10.5)
Chloride: 106 mEq/L (ref 96–112)
Creatinine, Ser: 0.7 mg/dL (ref 0.50–1.10)

## 2010-09-19 LAB — CBC WITH DIFFERENTIAL/PLATELET
BASO%: 0.8 % (ref 0.0–2.0)
EOS%: 4.4 % (ref 0.0–7.0)
HCT: 41.9 % (ref 34.8–46.6)
MCH: 27.7 pg (ref 25.1–34.0)
MCHC: 34.1 g/dL (ref 31.5–36.0)
NEUT%: 63.8 % (ref 38.4–76.8)
RDW: 12.7 % (ref 11.2–14.5)
lymph#: 1.3 10*3/uL (ref 0.9–3.3)

## 2010-09-24 ENCOUNTER — Ambulatory Visit (HOSPITAL_COMMUNITY)
Admission: RE | Admit: 2010-09-24 | Discharge: 2010-09-24 | Disposition: A | Discharge status: Home / Self Care | Payer: 59 | Source: Ambulatory Visit | Attending: Oncology | Admitting: Oncology

## 2010-09-24 DIAGNOSIS — I059 Rheumatic mitral valve disease, unspecified: Secondary | ICD-10-CM

## 2010-09-24 DIAGNOSIS — C50919 Malignant neoplasm of unspecified site of unspecified female breast: Secondary | ICD-10-CM | POA: Insufficient documentation

## 2010-09-24 DIAGNOSIS — Z01818 Encounter for other preprocedural examination: Secondary | ICD-10-CM | POA: Insufficient documentation

## 2010-10-10 ENCOUNTER — Other Ambulatory Visit: Payer: Self-pay | Admitting: Physician Assistant

## 2010-10-10 ENCOUNTER — Encounter (HOSPITAL_BASED_OUTPATIENT_CLINIC_OR_DEPARTMENT_OTHER): Payer: 59 | Admitting: Oncology

## 2010-10-10 DIAGNOSIS — Z5112 Encounter for antineoplastic immunotherapy: Secondary | ICD-10-CM

## 2010-10-10 DIAGNOSIS — Z171 Estrogen receptor negative status [ER-]: Secondary | ICD-10-CM

## 2010-10-10 DIAGNOSIS — Z5111 Encounter for antineoplastic chemotherapy: Secondary | ICD-10-CM

## 2010-10-10 DIAGNOSIS — C50319 Malignant neoplasm of lower-inner quadrant of unspecified female breast: Secondary | ICD-10-CM

## 2010-10-10 LAB — CBC WITH DIFFERENTIAL/PLATELET
BASO%: 0.7 % (ref 0.0–2.0)
EOS%: 3 % (ref 0.0–7.0)
HCT: 41.3 % (ref 34.8–46.6)
LYMPH%: 20.1 % (ref 14.0–49.7)
MCH: 27.1 pg (ref 25.1–34.0)
MCHC: 33.9 g/dL (ref 31.5–36.0)
MCV: 79.9 fL (ref 79.5–101.0)
MONO%: 6.4 % (ref 0.0–14.0)
NEUT%: 69.8 % (ref 38.4–76.8)
Platelets: 240 10*3/uL (ref 145–400)
lymph#: 1.3 10*3/uL (ref 0.9–3.3)

## 2010-10-10 LAB — COMPREHENSIVE METABOLIC PANEL
ALT: 19 U/L (ref 0–35)
AST: 16 U/L (ref 0–37)
Alkaline Phosphatase: 59 U/L (ref 39–117)
Creatinine, Ser: 0.81 mg/dL (ref 0.50–1.10)
Total Bilirubin: 0.3 mg/dL (ref 0.3–1.2)

## 2010-10-31 ENCOUNTER — Encounter (HOSPITAL_BASED_OUTPATIENT_CLINIC_OR_DEPARTMENT_OTHER): Payer: 59 | Admitting: Oncology

## 2010-10-31 ENCOUNTER — Other Ambulatory Visit: Payer: Self-pay | Admitting: Physician Assistant

## 2010-10-31 DIAGNOSIS — C50319 Malignant neoplasm of lower-inner quadrant of unspecified female breast: Secondary | ICD-10-CM

## 2010-10-31 DIAGNOSIS — Z5112 Encounter for antineoplastic immunotherapy: Secondary | ICD-10-CM

## 2010-10-31 DIAGNOSIS — Z171 Estrogen receptor negative status [ER-]: Secondary | ICD-10-CM

## 2010-10-31 LAB — CBC WITH DIFFERENTIAL/PLATELET
BASO%: 0.8 % (ref 0.0–2.0)
Basophils Absolute: 0.1 10*3/uL (ref 0.0–0.1)
EOS%: 2.6 % (ref 0.0–7.0)
HCT: 41.5 % (ref 34.8–46.6)
LYMPH%: 19.4 % (ref 14.0–49.7)
MCH: 27 pg (ref 25.1–34.0)
MCHC: 34.2 g/dL (ref 31.5–36.0)
MONO#: 0.5 10*3/uL (ref 0.1–0.9)
NEUT%: 69.4 % (ref 38.4–76.8)
Platelets: 245 10*3/uL (ref 145–400)

## 2010-10-31 LAB — COMPREHENSIVE METABOLIC PANEL
AST: 17 U/L (ref 0–37)
BUN: 15 mg/dL (ref 6–23)
Calcium: 8.6 mg/dL (ref 8.4–10.5)
Chloride: 106 mEq/L (ref 96–112)
Creatinine, Ser: 0.59 mg/dL (ref 0.50–1.10)
Glucose, Bld: 79 mg/dL (ref 70–99)

## 2010-11-20 ENCOUNTER — Encounter (HOSPITAL_BASED_OUTPATIENT_CLINIC_OR_DEPARTMENT_OTHER): Payer: 59 | Admitting: Oncology

## 2010-11-20 ENCOUNTER — Other Ambulatory Visit: Payer: Self-pay | Admitting: Physician Assistant

## 2010-11-20 DIAGNOSIS — Z5112 Encounter for antineoplastic immunotherapy: Secondary | ICD-10-CM

## 2010-11-20 DIAGNOSIS — C50319 Malignant neoplasm of lower-inner quadrant of unspecified female breast: Secondary | ICD-10-CM

## 2010-11-20 DIAGNOSIS — Z171 Estrogen receptor negative status [ER-]: Secondary | ICD-10-CM

## 2010-11-20 LAB — CBC WITH DIFFERENTIAL/PLATELET
Basophils Absolute: 0 10*3/uL (ref 0.0–0.1)
EOS%: 2.5 % (ref 0.0–7.0)
Eosinophils Absolute: 0.2 10*3/uL (ref 0.0–0.5)
HCT: 40.9 % (ref 34.8–46.6)
HGB: 14.2 g/dL (ref 11.6–15.9)
MCH: 27.6 pg (ref 25.1–34.0)
MONO#: 0.4 10*3/uL (ref 0.1–0.9)
NEUT#: 4.7 10*3/uL (ref 1.5–6.5)
NEUT%: 73.7 % (ref 38.4–76.8)
RDW: 13.5 % (ref 11.2–14.5)
lymph#: 1.1 10*3/uL (ref 0.9–3.3)

## 2010-11-20 LAB — COMPREHENSIVE METABOLIC PANEL
Albumin: 3.8 g/dL (ref 3.5–5.2)
BUN: 16 mg/dL (ref 6–23)
CO2: 28 mEq/L (ref 19–32)
Calcium: 9.8 mg/dL (ref 8.4–10.5)
Chloride: 104 mEq/L (ref 96–112)
Glucose, Bld: 129 mg/dL — ABNORMAL HIGH (ref 70–99)
Potassium: 3.9 mEq/L (ref 3.5–5.3)
Sodium: 139 mEq/L (ref 135–145)
Total Protein: 6.5 g/dL (ref 6.0–8.3)

## 2010-12-09 ENCOUNTER — Encounter (INDEPENDENT_AMBULATORY_CARE_PROVIDER_SITE_OTHER): Payer: Self-pay | Admitting: General Surgery

## 2010-12-12 ENCOUNTER — Other Ambulatory Visit: Payer: Self-pay | Admitting: Physician Assistant

## 2010-12-12 ENCOUNTER — Other Ambulatory Visit: Payer: Self-pay | Admitting: Oncology

## 2010-12-12 ENCOUNTER — Encounter (HOSPITAL_BASED_OUTPATIENT_CLINIC_OR_DEPARTMENT_OTHER): Payer: 59 | Admitting: Oncology

## 2010-12-12 DIAGNOSIS — Z5112 Encounter for antineoplastic immunotherapy: Secondary | ICD-10-CM

## 2010-12-12 DIAGNOSIS — Z853 Personal history of malignant neoplasm of breast: Secondary | ICD-10-CM

## 2010-12-12 DIAGNOSIS — C50319 Malignant neoplasm of lower-inner quadrant of unspecified female breast: Secondary | ICD-10-CM

## 2010-12-12 DIAGNOSIS — Z171 Estrogen receptor negative status [ER-]: Secondary | ICD-10-CM

## 2010-12-12 LAB — CBC WITH DIFFERENTIAL/PLATELET
Basophils Absolute: 0.1 10*3/uL (ref 0.0–0.1)
EOS%: 3.5 % (ref 0.0–7.0)
Eosinophils Absolute: 0.2 10*3/uL (ref 0.0–0.5)
HGB: 13.8 g/dL (ref 11.6–15.9)
MCV: 78.8 fL — ABNORMAL LOW (ref 79.5–101.0)
MONO%: 6.5 % (ref 0.0–14.0)
NEUT#: 4.5 10*3/uL (ref 1.5–6.5)
RBC: 5.04 10*6/uL (ref 3.70–5.45)
RDW: 13.4 % (ref 11.2–14.5)
WBC: 6.5 10*3/uL (ref 3.9–10.3)
lymph#: 1.3 10*3/uL (ref 0.9–3.3)
nRBC: 0 % (ref 0–0)

## 2010-12-12 LAB — COMPREHENSIVE METABOLIC PANEL
ALT: 21 U/L (ref 0–35)
Albumin: 3.6 g/dL (ref 3.5–5.2)
Alkaline Phosphatase: 67 U/L (ref 39–117)
CO2: 28 mEq/L (ref 19–32)
Glucose, Bld: 81 mg/dL (ref 70–99)
Potassium: 4.1 mEq/L (ref 3.5–5.3)
Sodium: 140 mEq/L (ref 135–145)
Total Bilirubin: 0.2 mg/dL — ABNORMAL LOW (ref 0.3–1.2)
Total Protein: 6.4 g/dL (ref 6.0–8.3)

## 2010-12-30 ENCOUNTER — Ambulatory Visit (HOSPITAL_COMMUNITY)
Admission: RE | Admit: 2010-12-30 | Discharge: 2010-12-30 | Disposition: A | Discharge status: Home / Self Care | Payer: 59 | Source: Ambulatory Visit | Attending: Oncology | Admitting: Oncology

## 2010-12-30 DIAGNOSIS — Z79899 Other long term (current) drug therapy: Secondary | ICD-10-CM | POA: Insufficient documentation

## 2010-12-30 DIAGNOSIS — C50919 Malignant neoplasm of unspecified site of unspecified female breast: Secondary | ICD-10-CM | POA: Insufficient documentation

## 2010-12-30 DIAGNOSIS — I319 Disease of pericardium, unspecified: Secondary | ICD-10-CM | POA: Insufficient documentation

## 2010-12-30 DIAGNOSIS — Z01818 Encounter for other preprocedural examination: Secondary | ICD-10-CM | POA: Insufficient documentation

## 2011-01-02 ENCOUNTER — Other Ambulatory Visit: Payer: Self-pay | Admitting: Physician Assistant

## 2011-01-02 ENCOUNTER — Encounter (HOSPITAL_BASED_OUTPATIENT_CLINIC_OR_DEPARTMENT_OTHER): Payer: 59 | Admitting: Oncology

## 2011-01-02 DIAGNOSIS — Z171 Estrogen receptor negative status [ER-]: Secondary | ICD-10-CM

## 2011-01-02 DIAGNOSIS — Z5112 Encounter for antineoplastic immunotherapy: Secondary | ICD-10-CM

## 2011-01-02 DIAGNOSIS — C50319 Malignant neoplasm of lower-inner quadrant of unspecified female breast: Secondary | ICD-10-CM

## 2011-01-02 LAB — COMPREHENSIVE METABOLIC PANEL
ALT: 30 U/L (ref 0–35)
AST: 19 U/L (ref 0–37)
Alkaline Phosphatase: 61 U/L (ref 39–117)
Potassium: 4.3 mEq/L (ref 3.5–5.3)
Sodium: 140 mEq/L (ref 135–145)
Total Bilirubin: 0.4 mg/dL (ref 0.3–1.2)
Total Protein: 6.2 g/dL (ref 6.0–8.3)

## 2011-01-02 LAB — CBC WITH DIFFERENTIAL/PLATELET
BASO%: 0.2 % (ref 0.0–2.0)
EOS%: 2.1 % (ref 0.0–7.0)
LYMPH%: 19.4 % (ref 14.0–49.7)
MCHC: 34.7 g/dL (ref 31.5–36.0)
MCV: 80.5 fL (ref 79.5–101.0)
MONO%: 3.5 % (ref 0.0–14.0)
Platelets: 262 10*3/uL (ref 145–400)
RBC: 4.95 10*6/uL (ref 3.70–5.45)
RDW: 13.6 % (ref 11.2–14.5)

## 2011-01-23 ENCOUNTER — Encounter (HOSPITAL_BASED_OUTPATIENT_CLINIC_OR_DEPARTMENT_OTHER): Payer: 59 | Admitting: Oncology

## 2011-01-23 ENCOUNTER — Other Ambulatory Visit: Payer: Self-pay | Admitting: Physician Assistant

## 2011-01-23 DIAGNOSIS — Z5112 Encounter for antineoplastic immunotherapy: Secondary | ICD-10-CM

## 2011-01-23 DIAGNOSIS — C50319 Malignant neoplasm of lower-inner quadrant of unspecified female breast: Secondary | ICD-10-CM

## 2011-01-23 DIAGNOSIS — Z171 Estrogen receptor negative status [ER-]: Secondary | ICD-10-CM

## 2011-01-23 LAB — CBC WITH DIFFERENTIAL/PLATELET
EOS%: 2.7 % (ref 0.0–7.0)
Eosinophils Absolute: 0.2 10*3/uL (ref 0.0–0.5)
LYMPH%: 19.1 % (ref 14.0–49.7)
MCH: 27.2 pg (ref 25.1–34.0)
MCHC: 34.6 g/dL (ref 31.5–36.0)
MCV: 78.6 fL — ABNORMAL LOW (ref 79.5–101.0)
MONO%: 4.6 % (ref 0.0–14.0)
NEUT#: 4.6 10*3/uL (ref 1.5–6.5)
Platelets: 236 10*3/uL (ref 145–400)
RBC: 4.86 10*6/uL (ref 3.70–5.45)

## 2011-01-24 LAB — COMPREHENSIVE METABOLIC PANEL
Alkaline Phosphatase: 71 U/L (ref 39–117)
Glucose, Bld: 128 mg/dL — ABNORMAL HIGH (ref 70–99)
Sodium: 141 mEq/L (ref 135–145)
Total Bilirubin: 0.3 mg/dL (ref 0.3–1.2)
Total Protein: 6.3 g/dL (ref 6.0–8.3)

## 2011-01-29 ENCOUNTER — Ambulatory Visit
Admission: RE | Admit: 2011-01-29 | Discharge: 2011-01-29 | Disposition: A | Discharge status: Home / Self Care | Payer: 59 | Source: Ambulatory Visit | Attending: Oncology | Admitting: Oncology

## 2011-01-29 DIAGNOSIS — Z853 Personal history of malignant neoplasm of breast: Secondary | ICD-10-CM

## 2011-02-03 ENCOUNTER — Encounter: Payer: Self-pay | Admitting: *Deleted

## 2011-02-12 ENCOUNTER — Other Ambulatory Visit: Payer: Self-pay | Admitting: Physician Assistant

## 2011-02-12 ENCOUNTER — Encounter (HOSPITAL_BASED_OUTPATIENT_CLINIC_OR_DEPARTMENT_OTHER): Payer: 59 | Admitting: Oncology

## 2011-02-12 ENCOUNTER — Ambulatory Visit
Admission: RE | Admit: 2011-02-12 | Discharge: 2011-02-12 | Disposition: A | Discharge status: Home / Self Care | Payer: 59 | Source: Ambulatory Visit | Attending: Radiation Oncology | Admitting: Radiation Oncology

## 2011-02-12 DIAGNOSIS — Z5112 Encounter for antineoplastic immunotherapy: Secondary | ICD-10-CM

## 2011-02-12 DIAGNOSIS — C50319 Malignant neoplasm of lower-inner quadrant of unspecified female breast: Secondary | ICD-10-CM

## 2011-02-12 DIAGNOSIS — Z171 Estrogen receptor negative status [ER-]: Secondary | ICD-10-CM

## 2011-02-12 LAB — CBC WITH DIFFERENTIAL/PLATELET
Eosinophils Absolute: 0.2 10*3/uL (ref 0.0–0.5)
HCT: 40.6 % (ref 34.8–46.6)
LYMPH%: 25.2 % (ref 14.0–49.7)
MONO#: 0.5 10*3/uL (ref 0.1–0.9)
NEUT#: 4.3 10*3/uL (ref 1.5–6.5)
NEUT%: 63.9 % (ref 38.4–76.8)
Platelets: 246 10*3/uL (ref 145–400)
WBC: 6.6 10*3/uL (ref 3.9–10.3)

## 2011-02-12 LAB — COMPREHENSIVE METABOLIC PANEL
BUN: 23 mg/dL (ref 6–23)
CO2: 22 mEq/L (ref 19–32)
Calcium: 9 mg/dL (ref 8.4–10.5)
Chloride: 107 mEq/L (ref 96–112)
Creatinine, Ser: 0.65 mg/dL (ref 0.50–1.10)
Glucose, Bld: 99 mg/dL (ref 70–99)

## 2011-02-25 NOTE — Progress Notes (Signed)
This encounter was created in error - please disregard.

## 2011-03-05 ENCOUNTER — Other Ambulatory Visit: Payer: Self-pay | Admitting: Oncology

## 2011-03-05 DIAGNOSIS — C50919 Malignant neoplasm of unspecified site of unspecified female breast: Secondary | ICD-10-CM

## 2011-03-06 ENCOUNTER — Ambulatory Visit (HOSPITAL_BASED_OUTPATIENT_CLINIC_OR_DEPARTMENT_OTHER): Payer: 59 | Admitting: Physician Assistant

## 2011-03-06 ENCOUNTER — Other Ambulatory Visit (HOSPITAL_BASED_OUTPATIENT_CLINIC_OR_DEPARTMENT_OTHER): Payer: 59

## 2011-03-06 ENCOUNTER — Other Ambulatory Visit: Payer: Self-pay

## 2011-03-06 ENCOUNTER — Ambulatory Visit (HOSPITAL_BASED_OUTPATIENT_CLINIC_OR_DEPARTMENT_OTHER): Payer: 59

## 2011-03-06 DIAGNOSIS — C50319 Malignant neoplasm of lower-inner quadrant of unspecified female breast: Secondary | ICD-10-CM

## 2011-03-06 DIAGNOSIS — Z5112 Encounter for antineoplastic immunotherapy: Secondary | ICD-10-CM

## 2011-03-06 DIAGNOSIS — Z171 Estrogen receptor negative status [ER-]: Secondary | ICD-10-CM

## 2011-03-06 DIAGNOSIS — C50919 Malignant neoplasm of unspecified site of unspecified female breast: Secondary | ICD-10-CM

## 2011-03-06 LAB — COMPREHENSIVE METABOLIC PANEL
ALT: 25 U/L (ref 0–35)
BUN: 25 mg/dL — ABNORMAL HIGH (ref 6–23)
CO2: 25 mEq/L (ref 19–32)
Calcium: 9 mg/dL (ref 8.4–10.5)
Chloride: 103 mEq/L (ref 96–112)
Creatinine, Ser: 0.68 mg/dL (ref 0.50–1.10)

## 2011-03-06 LAB — CBC WITH DIFFERENTIAL/PLATELET
Eosinophils Absolute: 0.2 10*3/uL (ref 0.0–0.5)
HCT: 40.7 % (ref 34.8–46.6)
LYMPH%: 21 % (ref 14.0–49.7)
MONO#: 0.3 10*3/uL (ref 0.1–0.9)
NEUT#: 4.8 10*3/uL (ref 1.5–6.5)
NEUT%: 70.6 % (ref 38.4–76.8)
Platelets: 232 10*3/uL (ref 145–400)
WBC: 6.8 10*3/uL (ref 3.9–10.3)

## 2011-03-06 MED ORDER — SODIUM CHLORIDE 0.9 % IJ SOLN
10.0000 mL | INTRAMUSCULAR | Status: DC | PRN
  Administered 2011-03-06 (×2): 10 mL
  Filled 2011-03-06: qty 10

## 2011-03-06 MED ORDER — SODIUM CHLORIDE 0.9 % IV SOLN
Freq: Once | INTRAVENOUS | Status: AC
  Administered 2011-03-06: 16:00:00 via INTRAVENOUS

## 2011-03-06 MED ORDER — HEPARIN SOD (PORK) LOCK FLUSH 100 UNIT/ML IV SOLN
500.0000 [IU] | Freq: Once | INTRAVENOUS | Status: AC | PRN
  Administered 2011-03-06: 500 [IU]
  Filled 2011-03-06: qty 5

## 2011-03-06 MED ORDER — DIPHENHYDRAMINE HCL 50 MG/ML IJ SOLN
12.5000 mg | Freq: Once | INTRAMUSCULAR | Status: AC
  Administered 2011-03-06: 12.5 mg via INTRAVENOUS

## 2011-03-06 MED ORDER — TRASTUZUMAB CHEMO INJECTION 440 MG
6.0000 mg/kg | Freq: Once | INTRAVENOUS | Status: AC
  Administered 2011-03-06: 525 mg via INTRAVENOUS
  Filled 2011-03-06: qty 25

## 2011-03-06 MED ORDER — ACETAMINOPHEN 325 MG PO TABS: 650.0000 mg | ORAL_TABLET | Freq: Once | ORAL | Status: DC

## 2011-03-06 NOTE — Progress Notes (Signed)
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  PAST THERAPY:  Status post 9 weekly doses of Taxol/Herceptin, now on maintenance q.3-week Herceptin after completion of radiation therapy.  SUBJECTIVE:  Tracy Barnes is seen today prior to her next q.3-week dose of Herceptin.  She is feeling great, denying any unexplained fevers, chills, night sweats, shortness of breath, chest pain, nausea, emesis, diarrhea, or constipation issues.  REVIEW OF SYSTEMS:  Review of systems is negative.  ALLERGIES:  Codeine causes urticaria.  CURRENT MEDICATIONS:  Current medications reviewed with the patient as per EMR.  ECOG status of 0.  OBJECTIVE PHYSICAL EXAMINATION:  Vital Signs:  Blood pressure is 129/76, pulse 88, respirations 20, temp 98, weight 190 pounds.  HEENT: Conjunctivae pink.  Sclerae anicteric.  Oropharynx is benign without oral mucositis or candidiasis.  Lungs:  Clear to auscultation without wheezing or rhonchi.  Heart:  Regular rate and rhythm.  Abdomen:  Soft, nontender without organomegaly.  Normal bowel sounds.  Extremities: Benign.  Neurologic Exam:  Nonfocal.  LABORATORY DATA:  Hemoglobin 13.9 g, platelet count 232,000, WBC 6800 with an ANC of 4800.  CMET pending.  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  She underwent a left lumpectomy with sentinel node dissection, revealing negative margins and 0 out of 4 positive nodes, estrogen receptor/progesterone receptor negative, HER-2  positive with a CISH ratio of 2.79 and a Ki-67 of 60%. She completed 9 weekly doses of Taxol/Herceptin, she then received weekly Herceptin for the duration of her radiation, which was completed on 08/04/2010, now on q.3-week maintenance Herceptin.  Case has been reviewed with Dr. Donnie Coffin.  PLAN:  Tracy Barnes will receive treatment today as scheduled.  Of note, her Benadryl will be at 12.5 mg.  She will return in 3 weeks for labs and Herceptin alone and in 6 weeks for labs, followup exam, and Herceptin.  She knows to contact us in the interim if the need should arise.    ______________________________ Sharyl Nimrod, PA CS/MEDQ  D:  03/06/2011  T:  03/06/2011  Job:  [id number]

## 2011-03-06 NOTE — Progress Notes (Signed)
Progress note dictated-CTS 

## 2011-03-06 NOTE — Patient Instructions (Addendum)
[facility] Discharge Instructions for Patients Receiving Chemotherapy  Today you received the following chemotherapy agent---Herceptin   If you develop nausea and vomiting that is not controlled by your nausea medication, call the clinic. If it is after clinic hours your family physician or the after hours number for the clinic or go to the Emergency Department.  Follow up on 03/27/11 as scheduled   BELOW ARE SYMPTOMS THAT SHOULD BE REPORTED IMMEDIATELY:  *FEVER GREATER THAN 100.5 F  *CHILLS WITH OR WITHOUT FEVER  NAUSEA AND VOMITING THAT IS NOT CONTROLLED WITH YOUR NAUSEA MEDICATION  *UNUSUAL SHORTNESS OF BREATH  *UNUSUAL BRUISING OR BLEEDING  TENDERNESS IN MOUTH AND THROAT WITH OR WITHOUT PRESENCE OF ULCERS  *URINARY PROBLEMS  *BOWEL PROBLEMS  UNUSUAL RASH Items with * indicate a potential emergency and should be followed up as soon as possible  Feel free to call the clinic you have any questions or concerns. The clinic phone number is [phone].   I have been informed and understand all the instructions given to me. I know to contact the clinic, my physician, or go to the Emergency Department if any problems should occur. I do not have any questions at this time, but understand that I may call the clinic during office hours   should I have any questions or need assistance in obtaining follow up care.    __________________________________________  _____________  __________ Signature of Patient or Authorized Representative            Date                   Time    __________________________________________ Nurse's Signature

## 2011-03-27 ENCOUNTER — Other Ambulatory Visit: Payer: Self-pay | Admitting: Physician Assistant

## 2011-03-27 ENCOUNTER — Other Ambulatory Visit: Payer: Self-pay

## 2011-03-27 ENCOUNTER — Ambulatory Visit (HOSPITAL_BASED_OUTPATIENT_CLINIC_OR_DEPARTMENT_OTHER): Payer: 59

## 2011-03-27 ENCOUNTER — Other Ambulatory Visit (HOSPITAL_BASED_OUTPATIENT_CLINIC_OR_DEPARTMENT_OTHER): Payer: 59

## 2011-03-27 VITALS — BP 155/90 | HR 89 | Temp 98.1°F

## 2011-03-27 DIAGNOSIS — Z5112 Encounter for antineoplastic immunotherapy: Secondary | ICD-10-CM

## 2011-03-27 DIAGNOSIS — C50319 Malignant neoplasm of lower-inner quadrant of unspecified female breast: Secondary | ICD-10-CM

## 2011-03-27 DIAGNOSIS — C50919 Malignant neoplasm of unspecified site of unspecified female breast: Secondary | ICD-10-CM

## 2011-03-27 LAB — CBC WITH DIFFERENTIAL/PLATELET
BASO%: 0.5 % (ref 0.0–2.0)
Basophils Absolute: 0 10*3/uL (ref 0.0–0.1)
EOS%: 2.5 % (ref 0.0–7.0)
Eosinophils Absolute: 0.2 10*3/uL (ref 0.0–0.5)
HCT: 40.5 % (ref 34.8–46.6)
HGB: 13.9 g/dL (ref 11.6–15.9)
LYMPH%: 20.7 % (ref 14.0–49.7)
MCH: 27.1 pg (ref 25.1–34.0)
MCHC: 34.3 g/dL (ref 31.5–36.0)
MCV: 79.1 fL — ABNORMAL LOW (ref 79.5–101.0)
MONO#: 0.4 10*3/uL (ref 0.1–0.9)
MONO%: 4.6 % (ref 0.0–14.0)
NEUT#: 5.4 10*3/uL (ref 1.5–6.5)
NEUT%: 71.7 % (ref 38.4–76.8)
Platelets: 234 10*3/uL (ref 145–400)
RBC: 5.12 10*6/uL (ref 3.70–5.45)
RDW: 13.4 % (ref 11.2–14.5)
WBC: 7.6 10*3/uL (ref 3.9–10.3)
lymph#: 1.6 10*3/uL (ref 0.9–3.3)
nRBC: 0 % (ref 0–0)

## 2011-03-27 LAB — COMPREHENSIVE METABOLIC PANEL
ALT: 26 U/L (ref 0–35)
AST: 26 U/L (ref 0–37)
Alkaline Phosphatase: 86 U/L (ref 39–117)
Sodium: 140 mEq/L (ref 135–145)
Total Bilirubin: 0.2 mg/dL — ABNORMAL LOW (ref 0.3–1.2)
Total Protein: 8.5 g/dL — ABNORMAL HIGH (ref 6.0–8.3)

## 2011-03-27 MED ORDER — DIPHENHYDRAMINE HCL 50 MG/ML IJ SOLN
12.5000 mg | Freq: Once | INTRAMUSCULAR | Status: AC
  Administered 2011-03-27: 15:00:00 via INTRAVENOUS

## 2011-03-27 MED ORDER — HEPARIN SOD (PORK) LOCK FLUSH 100 UNIT/ML IV SOLN
500.0000 [IU] | Freq: Once | INTRAVENOUS | Status: AC | PRN
  Administered 2011-03-27: 500 [IU]
  Filled 2011-03-27: qty 5

## 2011-03-27 MED ORDER — SODIUM CHLORIDE 0.9 % IJ SOLN
10.0000 mL | INTRAMUSCULAR | Status: DC | PRN
  Administered 2011-03-27: 10 mL
  Filled 2011-03-27: qty 10

## 2011-03-27 MED ORDER — TRASTUZUMAB CHEMO INJECTION 440 MG
6.0000 mg/kg | Freq: Once | INTRAVENOUS | Status: AC
  Administered 2011-03-27: 525 mg via INTRAVENOUS
  Filled 2011-03-27: qty 25

## 2011-03-27 MED ORDER — SODIUM CHLORIDE 0.9 % IV SOLN
Freq: Once | INTRAVENOUS | Status: AC
  Administered 2011-03-27: 15:00:00 via INTRAVENOUS

## 2011-03-27 MED ORDER — ACETAMINOPHEN 325 MG PO TABS
650.0000 mg | ORAL_TABLET | Freq: Once | ORAL | Status: AC
  Administered 2011-03-27: 650 mg via ORAL

## 2011-03-31 ENCOUNTER — Other Ambulatory Visit: Payer: Self-pay | Admitting: *Deleted

## 2011-03-31 ENCOUNTER — Other Ambulatory Visit: Payer: Self-pay

## 2011-03-31 DIAGNOSIS — C50319 Malignant neoplasm of lower-inner quadrant of unspecified female breast: Secondary | ICD-10-CM

## 2011-03-31 MED ORDER — ZOLPIDEM TARTRATE 5 MG PO TABS: 10.0000 mg | ORAL_TABLET | Freq: Every day | ORAL | Status: DC

## 2011-04-06 ENCOUNTER — Telehealth: Payer: Self-pay | Admitting: *Deleted

## 2011-04-06 DIAGNOSIS — C50919 Malignant neoplasm of unspecified site of unspecified female breast: Secondary | ICD-10-CM

## 2011-04-06 MED ORDER — AZITHROMYCIN 250 MG PO TABS: ORAL_TABLET | ORAL | Status: AC

## 2011-04-16 ENCOUNTER — Other Ambulatory Visit: Payer: Self-pay | Admitting: Physician Assistant

## 2011-04-17 ENCOUNTER — Telehealth: Payer: Self-pay | Admitting: *Deleted

## 2011-04-17 ENCOUNTER — Other Ambulatory Visit: Payer: Self-pay | Admitting: Oncology

## 2011-04-17 ENCOUNTER — Ambulatory Visit (HOSPITAL_BASED_OUTPATIENT_CLINIC_OR_DEPARTMENT_OTHER): Payer: 59

## 2011-04-17 ENCOUNTER — Other Ambulatory Visit: Payer: Self-pay

## 2011-04-17 ENCOUNTER — Other Ambulatory Visit (HOSPITAL_BASED_OUTPATIENT_CLINIC_OR_DEPARTMENT_OTHER): Payer: 59

## 2011-04-17 ENCOUNTER — Ambulatory Visit (HOSPITAL_BASED_OUTPATIENT_CLINIC_OR_DEPARTMENT_OTHER): Payer: 59 | Admitting: Physician Assistant

## 2011-04-17 VITALS — BP 127/85 | HR 93 | Temp 98.0°F | Ht 64.0 in | Wt 190.8 lb

## 2011-04-17 DIAGNOSIS — C50319 Malignant neoplasm of lower-inner quadrant of unspecified female breast: Secondary | ICD-10-CM

## 2011-04-17 DIAGNOSIS — C50919 Malignant neoplasm of unspecified site of unspecified female breast: Secondary | ICD-10-CM

## 2011-04-17 DIAGNOSIS — Z5112 Encounter for antineoplastic immunotherapy: Secondary | ICD-10-CM

## 2011-04-17 LAB — CBC WITH DIFFERENTIAL/PLATELET
Eosinophils Absolute: 0.2 10*3/uL (ref 0.0–0.5)
LYMPH%: 16.6 % (ref 14.0–49.7)
MCHC: 34.2 g/dL (ref 31.5–36.0)
MCV: 78.7 fL — ABNORMAL LOW (ref 79.5–101.0)
MONO%: 5 % (ref 0.0–14.0)
NEUT#: 6.4 10*3/uL (ref 1.5–6.5)
Platelets: 267 10*3/uL (ref 145–400)
RBC: 5.12 10*6/uL (ref 3.70–5.45)

## 2011-04-17 LAB — COMPREHENSIVE METABOLIC PANEL
Alkaline Phosphatase: 79 U/L (ref 39–117)
Glucose, Bld: 82 mg/dL (ref 70–99)
Sodium: 138 mEq/L (ref 135–145)
Total Bilirubin: 0.2 mg/dL — ABNORMAL LOW (ref 0.3–1.2)
Total Protein: 6.7 g/dL (ref 6.0–8.3)

## 2011-04-17 LAB — CANCER ANTIGEN 27.29: CA 27.29: 4 U/mL (ref 0–39)

## 2011-04-17 MED ORDER — SODIUM CHLORIDE 0.9 % IJ SOLN
10.0000 mL | INTRAMUSCULAR | Status: DC | PRN
  Administered 2011-04-17: 10 mL
  Filled 2011-04-17: qty 10

## 2011-04-17 MED ORDER — TRASTUZUMAB CHEMO INJECTION 440 MG
6.0000 mg/kg | Freq: Once | INTRAVENOUS | Status: AC
  Administered 2011-04-17: 525 mg via INTRAVENOUS
  Filled 2011-04-17: qty 25

## 2011-04-17 MED ORDER — HEPARIN SOD (PORK) LOCK FLUSH 100 UNIT/ML IV SOLN
500.0000 [IU] | Freq: Once | INTRAVENOUS | Status: AC | PRN
  Administered 2011-04-17: 500 [IU]
  Filled 2011-04-17: qty 5

## 2011-04-17 MED ORDER — ACETAMINOPHEN 325 MG PO TABS
650.0000 mg | ORAL_TABLET | Freq: Once | ORAL | Status: AC
  Administered 2011-04-17: 650 mg via ORAL

## 2011-04-17 NOTE — Patient Instructions (Signed)
Pt discharged to home, ambulatory. No change in discharge instructions.

## 2011-04-17 NOTE — Progress Notes (Signed)
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  PAST THERAPY:  S/P 9 weekly doses of Taxol/Herceptin, followed by radiation therapy with continued Herceptin, now for last q.3 week dose of maintenance Herceptin.  SUBJECTIVE:  Tracy Barnes is seen today prior to her last dose of q.3 week maintenance Herceptin.  She feels great, denying any unexplained fevers, chills, night sweats, shortness of breath, or chest pain.  No nausea, emesis, diarrhea, or constipation issues.  She will be due for a 3 month echocardiogram in January.  REVIEW OF SYSTEMS:  Negative.  ALLERGIES:  CODEINE.  CURRENT MEDICATIONS:  Reviewed with the patient, as per EMR.  PERFORMANCE STATUS:  ECOG status of zero.  PHYSICAL EXAMINATION:  Vital Signs:  Blood pressure is 127/85.  Pulse 93.  Respirations 20.  Temp 98.  Weight 190 pounds.  HEENT: Conjunctivae pink.  Sclerae anicteric.  Oropharynx is benign without oral mucositis or candidosis.  Lungs:  Clear to auscultation without wheezing or rhonchi.  Heart:  Regular rate and rhythm without murmurs, rubs, gallops, or clicks.  Abdomen:  Soft.  Normal bowel sounds. Extremities:  Free of pedal edema.  Neurologic Exam:  Nonfocal.  Breast Exam:  The left breast lumpectomy scar is benign.  It is noted in the inferior mammary fold.  No dominant mass effect.  No nipple inversion or discharge.  Axilla is clear.  The right breast is free of masses, skin changes, nipple inversion, or discharge.  LABORATORY DATA:  Hemoglobin 13.8 g, platelet count 267,000, WBC 8,500 with an ANC of 6,400.  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  She underwent a left lumpectomy with sentinel node dissection which revealed residual ductal carcinoma in situ, but no infiltrating component with negative margins and 0/4 nodes involved. The tumor was ER/PR negative and HER-2 positive with a CISH ratio of 2.79 and Ki-67 of 60%.  She completed 9 weekly dose of Taxol and Herceptin.  She then received weekly Herceptin for the duration of her radiation therapy, completed on 08/04/2010, now for her last q.3 week dose of maintenance Herceptin.  The case has been reviewed with Dr. Gaylyn Rong in Dr. Renelda Loma absence.  PLAN:  Tracy Barnes will receive treatment today as scheduled.  She will then return in 3 months for routine followup.  In the interim, we will have an echocardiogram performed in January 2013.  She will also be referred back to Dr. Claud Kelp for port removal.    ______________________________ Sharyl Nimrod, PA CS/MEDQ  D:  04/17/2011  T:  04/17/2011  Job:  [id number]

## 2011-04-17 NOTE — Telephone Encounter (Signed)
gave patient appointment for 06-2011 with dr.rubin made patient appointment with dr.ingram removal on 05-06-2011 made patient an echo appointment for 05-27-2011 at 9:00am

## 2011-04-17 NOTE — Progress Notes (Signed)
Progress note dictated-CTS 

## 2011-05-14 ENCOUNTER — Ambulatory Visit (INDEPENDENT_AMBULATORY_CARE_PROVIDER_SITE_OTHER): Payer: Self-pay | Admitting: General Surgery

## 2011-05-14 ENCOUNTER — Encounter (INDEPENDENT_AMBULATORY_CARE_PROVIDER_SITE_OTHER): Payer: Self-pay | Admitting: General Surgery

## 2011-05-14 VITALS — BP 118/76 | HR 80 | Temp 97.6°F | Resp 16 | Ht 61.0 in | Wt 188.8 lb

## 2011-05-14 DIAGNOSIS — Z853 Personal history of malignant neoplasm of breast: Secondary | ICD-10-CM

## 2011-05-14 NOTE — Progress Notes (Signed)
Patient ID: DAZJA HOUCHIN, female   DOB: 1956/01/19, 56 y.o.   MRN: [id number]  Chief Complaint  Patient presents with  . Breast Cancer Long Term Follow Up    discuss PAC removal    HPI FUSAE Barnes is a 56 y.o. female.  She returns to see me because wants her Port-A-Cath out.  This patient underwent left partial mastectomy, sentinel lymph node biopsy and Port-A-Cath insertion on March 26, 2011. 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 She had adjuvant Herceptin and chemotherapy and radiation therapy. She has completed all of this.  Her last mammogram was December 12, 2010 and this looks fine, category 2.  Her last breast exam was on April 17, 2011 with Dr. Donnie Coffin and his PA and everything was apparently fine. She was hoping not to have to have another breast exam today. HPI  Past Medical History  Diagnosis Date  . Cancer 02/19/2010    left breast    Past Surgical History  Procedure Date  . Abdominal hysterectomy     At 32 yrs of age. One ovary removed. Surgery due to fibroids per medical history form dated 02/25/10.  . Breast surgery 03/25/2010    left lumpectomy  . Deep axillary sentinel node biopsy / excision 03/25/2010    4/4 nodes negative  . Portacath placement 04/18/2010    Dr. Claud Kelp    Family History  Problem Relation Age of Onset  . Cancer Mother     Cancer of bladder and brain per medical history form dated 02/25/10.    Social History History  Substance Use Topics  . Smoking status: Never Smoker   . Smokeless tobacco: Not on file  . Alcohol Use: No    Allergies  Allergen Reactions  . Codeine Itching    Updated per [place].    Current Outpatient Prescriptions  Medication Sig Dispense Refill  . ALPRAZolam (XANAX) 0.25 MG tablet Take 0.25 mg by mouth at bedtime as needed.          Review of Systems Review of Systems  Constitutional: Negative for  fever, chills and unexpected weight change.  HENT: Negative for hearing loss, congestion, sore throat, trouble swallowing and voice change.   Eyes: Negative for visual disturbance.  Respiratory: Negative for cough and wheezing.   Cardiovascular: Negative for chest pain, palpitations and leg swelling.  Gastrointestinal: Negative for nausea, vomiting, abdominal pain, diarrhea, constipation, blood in stool, abdominal distention and anal bleeding.  Genitourinary: Negative for hematuria, vaginal bleeding and difficulty urinating.  Musculoskeletal: Negative for arthralgias.  Skin: Negative for rash and wound.  Neurological: Negative for seizures, syncope and headaches.  Hematological: Negative for adenopathy. Does not bruise/bleed easily.  Psychiatric/Behavioral: Negative for confusion.    Blood pressure 118/76, pulse 80, temperature 97.6 F (36.4 C), temperature source Temporal, resp. rate 16, height 5\' 1"  (1.549 m), weight 188 lb 12.8 oz (85.639 kg).  Physical Exam Physical Exam  Constitutional: She is oriented to person, place, and time. She appears well-developed and well-nourished. No distress.  Neck: Normal range of motion. Neck supple. No JVD present. No tracheal deviation present. No thyromegaly present.  Cardiovascular: Normal rate, regular rhythm, normal heart sounds and intact distal pulses.   No murmur heard. Pulmonary/Chest: Effort normal and breath sounds normal. No stridor. No respiratory distress. She has no wheezes. She has no rales. She exhibits no tenderness.  Port in right infraclavicular area.  Lymphadenopathy:    She has no cervical adenopathy.  Neurological: She is alert and oriented to person, place, and time. She exhibits normal muscle tone. Coordination normal.  Skin: Skin is warm and dry. No rash noted. She is not diaphoretic. No erythema. No pallor.  Psychiatric: She has a normal mood and affect. Her behavior is normal. Judgment and thought content normal.     Data Reviewed All mammograms, pathology reports, and old records from [facility] office and [facility].  Assessment    Desires Port-A-Cath removal.  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  Status post left partial mastectomy with dual needle loc., sentinel lymph biopsy and Port-A-Cath insertion March 25, 2010. No evidence of recurrence 13 months postop  Status post adjuvant chemotherapy and Herceptin presents with adjuvant radiation therapy    Plan    Scheduled for Port-A-Cath in removal. She desires this to be done under local anesthesia.  Annual mammography and bi-annual breast exam is recommended.   Angelia Mould. Derrell Lolling, M.D., [facility] General and Minimally invasive Surgery Breast and Colorectal Surgery Office:   [phone] Pager:   [phone]     05/14/2011, 1:48 PM

## 2011-05-14 NOTE — Patient Instructions (Signed)
You seem to be doing very well with no evidence of cancer recurrence. You will be scheduled for removal of the Port-A-Cath under local anesthesia in the near future.

## 2011-05-26 ENCOUNTER — Ambulatory Visit (HOSPITAL_COMMUNITY)
Admission: RE | Admit: 2011-05-26 | Discharge: 2011-05-26 | Disposition: A | Discharge status: Home / Self Care | Payer: 59 | Source: Ambulatory Visit | Attending: Physician Assistant | Admitting: Physician Assistant

## 2011-05-26 DIAGNOSIS — C50919 Malignant neoplasm of unspecified site of unspecified female breast: Secondary | ICD-10-CM

## 2011-05-26 DIAGNOSIS — Z09 Encounter for follow-up examination after completed treatment for conditions other than malignant neoplasm: Secondary | ICD-10-CM | POA: Insufficient documentation

## 2011-05-26 NOTE — Progress Notes (Signed)
  Echocardiogram 2D Echocardiogram has been performed.  Demetry Bendickson, Real Cons 05/26/2011, 9:58 AM

## 2011-05-27 ENCOUNTER — Ambulatory Visit (HOSPITAL_COMMUNITY): Payer: 59

## 2011-05-27 ENCOUNTER — Other Ambulatory Visit: Payer: Self-pay | Admitting: Physician Assistant

## 2011-05-27 DIAGNOSIS — C50919 Malignant neoplasm of unspecified site of unspecified female breast: Secondary | ICD-10-CM

## 2011-06-04 NOTE — H&P (Signed)
[name]     MRN: [id number]   Description: 56 year old female  Provider: Ernestene Mention, MD  Department: [facility]   Diagnoses     History of breast cancer   - Primary    V10.3    Reason for Visit     Breast Cancer Long Term Follow Up    discuss PAC removal        Vitals - Last Recorded       BP Pulse Temp(Src) Resp Ht Wt    118/76  80  97.6 F (36.4 C) (Temporal)  16  5\' 1"  (1.549 m)  188 lb 12.8 oz (85.639 kg)          BMI - 35.67 kg/m2                 History and Physical     Ernestene Mention, MD   Patient ID: Tracy Barnes, female   DOB: [birth year], 56 y.o.   MRN: [id number]    Chief Complaint   Patient presents with   .  Breast Cancer Long Term Follow Up       discuss PAC removal      HPI Tracy Barnes is a 56 y.o. female.  She returns to see me because wants her Port-A-Cath out.  This patient underwent left partial mastectomy, sentinel lymph node biopsy and Port-A-Cath insertion on March 26, 2011. 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 She had adjuvant Herceptin and chemotherapy and radiation therapy. She has completed all of this.  Her last mammogram was December 12, 2010 and this looks fine, category 2.  Her last breast exam was on April 17, 2011 with Dr. Donnie Coffin and his PA and everything was apparently fine. She was hoping not to have to have another breast exam today.     Past Medical History   Diagnosis  Date   .  Cancer  02/19/2010       left breast       Past Surgical History   Procedure  Date   .  Abdominal hysterectomy         At 32 yrs of age. One ovary removed. Surgery due to fibroids per medical history form dated 02/25/10.   .  Breast surgery  03/25/2010       left lumpectomy   .  Deep axillary sentinel node biopsy / excision  03/25/2010       4/4 nodes negative   .  Portacath placement  04/18/2010       Dr. Claud Kelp        Family History   Problem  Relation  Age of Onset   .  Cancer  Mother         Cancer of bladder and brain per medical history form dated 02/25/10.      Social History History   Substance Use Topics   .  Smoking status:  Never Smoker    .  Smokeless tobacco:  Not on file   .  Alcohol Use:  No       Allergies   Allergen  Reactions   .  Codeine  Itching       Updated per Mosaiq.       Current Outpatient Prescriptions   Medication  Sig  Dispense  Refill   .  ALPRAZolam (XANAX) 0.25 MG tablet  Take  0.25 mg by mouth at bedtime as needed.              Review of Systems Review of Systems  Constitutional: Negative for fever, chills and unexpected weight change.  HENT: Negative for hearing loss, congestion, sore throat, trouble swallowing and voice change.   Eyes: Negative for visual disturbance.  Respiratory: Negative for cough and wheezing.   Cardiovascular: Negative for chest pain, palpitations and leg swelling.  Gastrointestinal: Negative for nausea, vomiting, abdominal pain, diarrhea, constipation, blood in stool, abdominal distention and anal bleeding.  Genitourinary: Negative for hematuria, vaginal bleeding and difficulty urinating.  Musculoskeletal: Negative for arthralgias.  Skin: Negative for rash and wound.  Neurological: Negative for seizures, syncope and headaches.  Hematological: Negative for adenopathy. Does not bruise/bleed easily.  Psychiatric/Behavioral: Negative for confusion.    Blood pressure 118/76, pulse 80, temperature 97.6 F (36.4 C), temperature source Temporal, resp. rate 16, height 5\' 1"  (1.549 m), weight 188 lb 12.8 oz (85.639 kg).   Physical Exam   Constitutional: She is oriented to person, place, and time. She appears well-developed and well-nourished. No distress.  Neck: Normal range of motion. Neck supple. No JVD present. No tracheal deviation present. No thyromegaly present.  Cardiovascular: Normal rate, regular rhythm, normal heart  sounds and intact distal pulses.    No murmur heard. Pulmonary/Chest: Effort normal and breath sounds normal. No stridor. No respiratory distress. She has no wheezes. She has no rales. She exhibits no tenderness.       Port in right infraclavicular area.  Lymphadenopathy:    She has no cervical adenopathy.  Neurological: She is alert and oriented to person, place, and time. She exhibits normal muscle tone. Coordination normal.  Skin: Skin is warm and dry. No rash noted. She is not diaphoretic. No erythema. No pallor.  Psychiatric: She has a normal mood and affect. Her behavior is normal. Judgment and thought content normal.    Data Reviewed All mammograms, pathology reports, and old records from [facility] office and [facility].   Assessment   Desires Port-A-Cath removal.  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  Status post left partial mastectomy with dual needle loc., sentinel lymph biopsy and Port-A-Cath insertion March 25, 2010. No evidence of recurrence 13 months postop  Status post adjuvant chemotherapy and Herceptin presents with adjuvant radiation therapy   Plan Scheduled for Port-A-Cath in removal. She desires this to be done under local anesthesia.  Annual mammography and bi-annual breast exam is recommended.     Angelia Mould. Derrell Lolling, M.D., [facility] General and Minimally invasive Surgery Breast and Colorectal Surgery Office:   [phone] Pager:   [phone]

## 2011-06-05 ENCOUNTER — Encounter (HOSPITAL_BASED_OUTPATIENT_CLINIC_OR_DEPARTMENT_OTHER)
Admission: RE | Disposition: A | Discharge status: Home / Self Care | Payer: Self-pay | Source: Ambulatory Visit | Attending: General Surgery

## 2011-06-05 ENCOUNTER — Ambulatory Visit (HOSPITAL_BASED_OUTPATIENT_CLINIC_OR_DEPARTMENT_OTHER)
Admission: RE | Admit: 2011-06-05 | Discharge: 2011-06-05 | Disposition: A | Discharge status: Home / Self Care | Payer: 59 | Source: Ambulatory Visit | Attending: General Surgery | Admitting: General Surgery

## 2011-06-05 ENCOUNTER — Encounter (HOSPITAL_BASED_OUTPATIENT_CLINIC_OR_DEPARTMENT_OTHER): Payer: Self-pay | Admitting: *Deleted

## 2011-06-05 DIAGNOSIS — C50919 Malignant neoplasm of unspecified site of unspecified female breast: Secondary | ICD-10-CM | POA: Insufficient documentation

## 2011-06-05 DIAGNOSIS — Z923 Personal history of irradiation: Secondary | ICD-10-CM | POA: Insufficient documentation

## 2011-06-05 DIAGNOSIS — Z452 Encounter for adjustment and management of vascular access device: Secondary | ICD-10-CM

## 2011-06-05 DIAGNOSIS — Z9221 Personal history of antineoplastic chemotherapy: Secondary | ICD-10-CM | POA: Insufficient documentation

## 2011-06-05 HISTORY — PX: PORT-A-CATH REMOVAL: SHX5289

## 2011-06-05 SURGERY — MINOR REMOVAL PORT-A-CATH
Anesthesia: LOCAL | Site: Chest | Wound class: Clean

## 2011-06-05 MED ORDER — CHLORHEXIDINE GLUCONATE 4 % EX LIQD: 1.0000 "application " | Freq: Once | CUTANEOUS | Status: DC

## 2011-06-05 MED ORDER — BUPIVACAINE-EPINEPHRINE PF 0.5-1:200000 % IJ SOLN
INTRAMUSCULAR | Status: DC | PRN
  Administered 2011-06-05: 10 mL

## 2011-06-05 MED ORDER — SODIUM BICARBONATE 4 % IV SOLN: INTRAVENOUS | Status: DC | PRN

## 2011-06-05 MED ORDER — HYDROCODONE-ACETAMINOPHEN 5-325 MG PO TABS
1.0000 | ORAL_TABLET | ORAL | Status: AC | PRN
Start: 2011-06-05 — End: 2011-06-15

## 2011-06-05 MED ORDER — SODIUM BICARBONATE 4 % IV SOLN
INTRAVENOUS | Status: DC | PRN
  Administered 2011-06-05: 1 mL via INTRAVENOUS

## 2011-06-05 SURGICAL SUPPLY — 32 items
BENZOIN TINCTURE PRP APPL 2/3 (GAUZE/BANDAGES/DRESSINGS) IMPLANT
BLADE SURG 15 STRL LF DISP TIS (BLADE) ×1 IMPLANT
BLADE SURG 15 STRL SS (BLADE) ×1
CLOTH BEACON ORANGE TIMEOUT ST (SAFETY) ×2 IMPLANT
DERMABOND ADVANCED (GAUZE/BANDAGES/DRESSINGS) ×1
DERMABOND ADVANCED .7 DNX12 (GAUZE/BANDAGES/DRESSINGS) ×1 IMPLANT
DRAPE SURG 17X23 STRL (DRAPES) IMPLANT
ELECT REM PT RETURN 9FT ADLT (ELECTROSURGICAL)
ELECTRODE REM PT RTRN 9FT ADLT (ELECTROSURGICAL) IMPLANT
GAUZE SPONGE 4X4 12PLY STRL LF (GAUZE/BANDAGES/DRESSINGS) IMPLANT
GLOVE BIO SURGEON STRL SZ7 (GLOVE) ×2 IMPLANT
GLOVE ECLIPSE 6.5 STRL STRAW (GLOVE) ×2 IMPLANT
GLOVE EUDERMIC 7 POWDERFREE (GLOVE) ×2 IMPLANT
MARKER SKIN DUAL TIP RULER LAB (MISCELLANEOUS) ×2 IMPLANT
NDL SAFETY ECLIPSE 18X1.5 (NEEDLE) ×1 IMPLANT
NEEDLE HYPO 18GX1.5 SHARP (NEEDLE) ×1
NEEDLE HYPO 25X1 1.5 SAFETY (NEEDLE) ×2 IMPLANT
NS IRRIG 1000ML POUR BTL (IV SOLUTION) IMPLANT
PENCIL BUTTON HOLSTER BLD 10FT (ELECTRODE) IMPLANT
STRIP CLOSURE SKIN 1/2X4 (GAUZE/BANDAGES/DRESSINGS) IMPLANT
SUT MNCRL AB 3-0 PS2 18 (SUTURE) IMPLANT
SUT MON AB 4-0 PC3 18 (SUTURE) ×2 IMPLANT
SUT VIC AB 3-0 SH 27 (SUTURE) ×1
SUT VIC AB 3-0 SH 27X BRD (SUTURE) ×1 IMPLANT
SUT VIC AB 4-0 P-3 18XBRD (SUTURE) IMPLANT
SUT VIC AB 4-0 P3 18 (SUTURE)
SUT VIC AB 5-0 P-3 18X BRD (SUTURE) IMPLANT
SUT VIC AB 5-0 P3 18 (SUTURE)
SUT VICRYL 3-0 CR8 SH (SUTURE) IMPLANT
SWABSTICK POVIDONE IODINE SNGL (MISCELLANEOUS) IMPLANT
SYR CONTROL 10ML LL (SYRINGE) ×2 IMPLANT
TOWEL OR 17X24 6PK STRL BLUE (TOWEL DISPOSABLE) IMPLANT

## 2011-06-05 NOTE — Op Note (Signed)
Patient Name:           Tracy Barnes   Date of Surgery:        06/05/2011  Pre op Diagnosis:      History of cancer of the left breast  Post op Diagnosis:    same  Procedure:                 Removal of Port-A-Cath  Surgeon:                     Angelia Mould. Derrell Lolling, M.D., FACS  Assistant:                      none  Operative Indications:   This 56 year old woman underwent left partial mastectomy sentinel node biopsy and Port-A-Cath insertion last year. She had 2 cancers in the breast, 1 invasive and one area of DCIS that were receptor negative but Her-2 positive. She has received adjuvant Herceptin chemotherapy and adjuvant radiation therapy. She would like to have her Port-A-Cath removed and is brought to the operating room electively.  Operative Findings:       The port and catheter were removed intact  Procedure in Detail:          The patient was brought to room 8 at [facility]. The procedure was done under local anesthesia. Surgical time out was performed. The right anterior infraclavicular chest was prepped and draped in a sterile fashion. I could palpate the port in the right infraclavicular area. 0.5% Marcaine with epinephrine was used as a local infiltration anesthetic. Transverse incision was made overlying the port, through the previous scar. I dissected the port out of its capsule. I cut the Prolene sutures and removed the port and catheter intact. There was no bleeding. There was no evidence of infection. The subcutaneous tissues were closed with interrupted sutures of 3-0 Vicryl and the skin closed with a running subcuticular suture of 4-0 Monocryl and Dermabond. The patient tolerated the procedure well. There were no complications. Counts correct. EBL less than 5 cc.     Angelia Mould. Derrell Lolling, M.D., FACS General and Minimally Invasive Surgery Breast and Colorectal Surgery  06/05/2011 7:43 AM

## 2011-06-05 NOTE — Interval H&P Note (Signed)
History and Physical Interval Note:  06/05/2011 7:11 AM  Tracy Barnes  has presented today for surgery, with the diagnosis of breast cancer  The goals of treatment and the various methods of treatment have been discussed with the patient and family. After consideration of risks, benefits and other options for treatment, the patient has consented to  Procedure(s) (LRB): MINOR REMOVAL PORT-A-CATH (N/A) as a surgical intervention .  The patients' history has been reviewed, patient examined, no change in status, stable for surgery.  I have reviewed the patients' chart and labs.  Questions were answered to the patient's satisfaction.     Ernestene Mention

## 2011-06-08 ENCOUNTER — Encounter (HOSPITAL_BASED_OUTPATIENT_CLINIC_OR_DEPARTMENT_OTHER): Payer: Self-pay | Admitting: General Surgery

## 2011-06-11 ENCOUNTER — Encounter (INDEPENDENT_AMBULATORY_CARE_PROVIDER_SITE_OTHER): Payer: Commercial Managed Care - PPO | Admitting: General Surgery

## 2011-06-17 ENCOUNTER — Telehealth: Payer: Self-pay | Admitting: *Deleted

## 2011-06-17 NOTE — Telephone Encounter (Signed)
Pt. Called concerned that her blood pressure has been up the last couple of times she has been here.  She sees her PCP today and wanted some readings.  Called her with last 4 readings in Epic (back to 03/06/11).  Her last one here was 118/76.

## 2011-06-19 ENCOUNTER — Other Ambulatory Visit (HOSPITAL_BASED_OUTPATIENT_CLINIC_OR_DEPARTMENT_OTHER): Payer: 59 | Admitting: Lab

## 2011-06-19 DIAGNOSIS — C50919 Malignant neoplasm of unspecified site of unspecified female breast: Secondary | ICD-10-CM

## 2011-06-19 LAB — CBC WITH DIFFERENTIAL/PLATELET
BASO%: 0.4 % (ref 0.0–2.0)
Basophils Absolute: 0 10*3/uL (ref 0.0–0.1)
EOS%: 1.9 % (ref 0.0–7.0)
HCT: 41.9 % (ref 34.8–46.6)
HGB: 14.1 g/dL (ref 11.6–15.9)
LYMPH%: 19.7 % (ref 14.0–49.7)
MCH: 27 pg (ref 25.1–34.0)
MCHC: 33.7 g/dL (ref 31.5–36.0)
MCV: 80.2 fL (ref 79.5–101.0)
MONO%: 4.1 % (ref 0.0–14.0)
NEUT%: 73.9 % (ref 38.4–76.8)
Platelets: 241 10*3/uL (ref 145–400)

## 2011-06-20 LAB — COMPREHENSIVE METABOLIC PANEL
ALT: 21 U/L (ref 0–35)
AST: 17 U/L (ref 0–37)
BUN: 12 mg/dL (ref 6–23)
Calcium: 8.8 mg/dL (ref 8.4–10.5)
Creatinine, Ser: 0.73 mg/dL (ref 0.50–1.10)
Total Bilirubin: 0.3 mg/dL (ref 0.3–1.2)

## 2011-06-20 LAB — VITAMIN D 25 HYDROXY (VIT D DEFICIENCY, FRACTURES): Vit D, 25-Hydroxy: 36 ng/mL (ref 30–89)

## 2011-06-26 ENCOUNTER — Ambulatory Visit (HOSPITAL_BASED_OUTPATIENT_CLINIC_OR_DEPARTMENT_OTHER): Payer: 59 | Admitting: Oncology

## 2011-06-26 ENCOUNTER — Encounter: Payer: Self-pay | Admitting: Oncology

## 2011-06-26 ENCOUNTER — Telehealth: Payer: Self-pay | Admitting: Oncology

## 2011-06-26 VITALS — BP 145/84 | HR 90 | Temp 98.3°F | Ht 61.0 in | Wt 193.4 lb

## 2011-06-26 DIAGNOSIS — D0590 Unspecified type of carcinoma in situ of unspecified breast: Secondary | ICD-10-CM

## 2011-06-26 DIAGNOSIS — D0510 Intraductal carcinoma in situ of unspecified breast: Secondary | ICD-10-CM

## 2011-06-26 DIAGNOSIS — Z9221 Personal history of antineoplastic chemotherapy: Secondary | ICD-10-CM

## 2011-06-26 NOTE — Telephone Encounter (Signed)
gv pt appt schedule for july °

## 2011-06-26 NOTE — Progress Notes (Signed)
Hematology and Oncology Follow Up Visit  Tracy Barnes [id number] [birth year] 56 y.o. 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  CISH ratio 2.79, Ki-67 60%.  She then underwent a left lumpectomy with sentinel node dissection which revealed residual high-grade DCIS with negative margins and 4 sentinel nodes clear of metastatic disease. She completed 9 weekly doses of Taxol/Herceptin followed by radiation therapy with continued weekly Herceptin, she completed q. three-week maintenance Herceptin on 04/17/2011. Now on observation alone.  Interim History:   Tracy Barnes is doing well. She had a repeat 2-D echocardiogram performed on 05/24/2011. She denies any fevers, chills, night sweats, shortness of breath, or chest pain. No nausea, emesis, diarrhea, or constipation issues. She is unaware of any palpable breast masses. She would be due for bilateral diagnostic mammogram October of 2013 at the breast center. Energy level is excellent, she is working full-time without difficulty. She is sleeping well. She denies any breast tenderness, she denies any bleeding or bruising symptoms. No evidence of neuropathy.  A detailed review of systems is otherwise noncontributory as noted below.  Review of Systems: Constitutional:  no weight loss, fever, night sweats and feels well Eyes: no complaints ENT: no complaints Cardiovascular: no chest pain or dyspnea on exertion Respiratory: no cough, shortness of breath, or wheezing Neurological: no TIA or stroke symptoms Dermatological: negative Gastrointestinal: no abdominal pain, change in bowel habits, or black or bloody stools Genito-Urinary: no dysuria, trouble voiding, or hematuria Hematological and Lymphatic: negative Breast: negative for breast lumps Musculoskeletal:  negative Remaining ROS negative.   Medications:   I have reviewed the patient's current medications.  Current Outpatient Prescriptions  Medication Sig Dispense Refill  . ALPRAZolam (XANAX) 0.25 MG tablet Take 0.25 mg by mouth at bedtime as needed.          Allergies:  Allergies  Allergen Reactions  . Codeine Itching    Updated per Mos[facility]q.    Physical Exam: Filed Vitals:   06/26/11 1436  BP: 145/84  Pulse: 90  Temp: 98.3 F (36.8 C)    Body mass index is 36.54 kg/(m^2). Weight: 193 lbs. HEENT:  Sclerae anicteric, conjunctivae pink.  Oropharynx clear.  No mucositis or candidiasis.   Nodes:  No cervical, supraclavicular, or axillary lymphadenopathy palpated.  Breast Exam: The left breast was examined, the lumpectomy scar in the inferior portion is benign without any dominant mass effect, no frank nipple inversion no evidence of discharge. There is no palpable lymphadenopathy within the left axilla. The right breast was examined, it is free of masses, skin changes, nipple inversion or discharge. Axilla is also clear. Lungs:  Clear to auscultation bilaterally.  No crackles, rhonchi, or wheezes.   Heart:  Regular rate and rhythm.   Abdomen:  Soft, nontender.  Positive bowel sounds.  No organomegaly or masses palpated.   Musculoskeletal:  No focal spinal tenderness to palpation.  Extremities:  Benign.  No peripheral edema or cyanosis.   Skin:  Benign.   Neuro:  Nonfocal, alert and oriented x 3.   Lab Results: Lab Results  Component Value Date   WBC 6.5 06/19/2011   HGB 14.1 06/19/2011   HCT 41.9 06/19/2011   MCV 80.2 06/19/2011   PLT 241 06/19/2011   NEUTROABS 4.8 06/19/2011     Chemistry      Component Value  Date/Time   NA 138 06/19/2011 1040   K 4.1 06/19/2011 1040   CL 105 06/19/2011 1040   CO2 25 06/19/2011 1040   BUN 12 06/19/2011 1040   CREATININE 0.73 06/19/2011 1040      Component Value Date/Time   CALCIUM 8.8 06/19/2011 1040   ALKPHOS 75 06/19/2011 1040   AST 17 06/19/2011 1040    ALT 21 06/19/2011 1040   BILITOT 0.3 06/19/2011 1040      Lab Results  Component Value Date   LABCA2 17 06/19/2011    2D Echocardiogram: 05/26/11 Study Conclusions - Left ventricle: The cavity size was normal. Wall thickness was normal. Systolic function was normal. The estimated ejection fraction was in the range of 50% to 55%. Wall motion was normal; there were no regional wall motion abnormalities. Left ventricular diastolic function parameters were normal. - Atrial septum: No defect or patent foramen ovale was identified. Transthoracic echocardiography. M-mode, complete 2D, spectral Doppler, and color Doppler. Height: Height: 160cm. Height: 63in. Weight: Weight: 81.8kg. Weight: 180lb. Body mass index: BMI: 32kg/m^2. Body surface area: BSA: 1.10m^2. Blood pressure: 149/100. Patient status: Outpatient. Location: Echo laboratory. 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  CISH ratio 2.79, Ki-67 60%.  She then underwent a left lumpectomy with sentinel node dissection which revealed residual high-grade DCIS with negative margins and 4 sentinel nodes clear of metastatic disease. She completed 9 weekly doses of Taxol/Herceptin followed by radiation therapy with continued weekly Herceptin, she completed q. three-week maintenance Herceptin on 04/17/2011. Now on observation alone.  Case has been reviewed with Dr. Pierce Crane.  Plan:  Tracy Barnes will return to see [place] on a 4 month interval, we will obtain a CBC, serum chemistry, LDH, and CA 27-29 the week prior. She knows to contact us in the interim if the need should arise.  This plan was reviewed with the patient, who voices understanding and agreement.  She knows to call with any changes or  problems.    Jezabelle Chisolm T, PA-C 06/26/2011

## 2011-06-30 ENCOUNTER — Other Ambulatory Visit: Payer: Self-pay | Admitting: *Deleted

## 2011-06-30 DIAGNOSIS — C50319 Malignant neoplasm of lower-inner quadrant of unspecified female breast: Secondary | ICD-10-CM

## 2011-06-30 MED ORDER — VENLAFAXINE HCL ER 75 MG PO CP24: 75.0000 mg | ORAL_CAPSULE | Freq: Every day | ORAL | Status: DC

## 2011-07-08 NOTE — Telephone Encounter (Signed)
error 

## 2011-07-22 ENCOUNTER — Encounter: Payer: Self-pay | Admitting: Radiation Oncology

## 2011-07-22 DIAGNOSIS — Z923 Personal history of irradiation: Secondary | ICD-10-CM | POA: Insufficient documentation

## 2011-07-23 ENCOUNTER — Ambulatory Visit: Admission: RE | Admit: 2011-07-23 | Payer: 59 | Source: Ambulatory Visit | Admitting: Radiation Oncology

## 2011-07-23 ENCOUNTER — Telehealth: Payer: Self-pay | Admitting: *Deleted

## 2011-07-23 ENCOUNTER — Ambulatory Visit: Payer: 59 | Admitting: Radiation Oncology

## 2011-07-23 NOTE — Telephone Encounter (Signed)
Ms Moustafa called to ascertain if the Tracy Barnes from [facility] could coordinate her visit with with the Radiation Oncology visit for 08/13/11.  Called Arline Asp to inform her of patients wishes and suggested using our nursing exam area to conduct her assessment.

## 2011-08-07 ENCOUNTER — Emergency Department (INDEPENDENT_AMBULATORY_CARE_PROVIDER_SITE_OTHER)
Admission: EM | Admit: 2011-08-07 | Discharge: 2011-08-07 | Disposition: A | Discharge status: Home / Self Care | Payer: 59 | Source: Home / Self Care

## 2011-08-07 ENCOUNTER — Emergency Department (INDEPENDENT_AMBULATORY_CARE_PROVIDER_SITE_OTHER): Payer: 59

## 2011-08-07 ENCOUNTER — Encounter (HOSPITAL_COMMUNITY): Payer: Self-pay | Admitting: *Deleted

## 2011-08-07 DIAGNOSIS — S91331A Puncture wound without foreign body, right foot, initial encounter: Secondary | ICD-10-CM

## 2011-08-07 DIAGNOSIS — S91309A Unspecified open wound, unspecified foot, initial encounter: Secondary | ICD-10-CM

## 2011-08-07 MED ORDER — DOXYCYCLINE HYCLATE 100 MG PO CAPS: 100.0000 mg | ORAL_CAPSULE | Freq: Two times a day (BID) | ORAL | Status: AC

## 2011-08-07 NOTE — ED Provider Notes (Signed)
History     CSN: [id number]  Arrival date & time 08/07/11  1211   None     Chief Complaint  Patient presents with  . Foreign Body    (Consider location/radiation/quality/duration/timing/severity/associated sxs/prior treatment) HPI Comments: Patient states that 6 days ago she was walking in her yard in flip-flops when she felt like something poked her in her right heel. She figured if something was in her heel it would work its way to the surface but she continues to have discomfort and nothing has come out. No drainage.   Past Medical History  Diagnosis Date  . Cancer 02/19/2010    left breast  . Hx of radiation therapy 06/18/10 to 08/01/10    L breast    Past Surgical History  Procedure Date  . Abdominal hysterectomy     At 32 yrs of age. One ovary removed. Surgery due to fibroids per medical history form dated 02/25/10.  . Breast surgery 03/25/2010    left lumpectomy  . Deep axillary sentinel node biopsy / excision 03/25/2010    4/4 nodes negative  . Portacath placement 04/18/2010    Dr. Claud Kelp  . Port-a-cath removal 06/05/2011    Procedure: MINOR REMOVAL PORT-A-CATH;  Surgeon: Ernestene Mention, MD;  Location: [facility];  Service: General;  Laterality: N/A;  right    Family History  Problem Relation Age of Onset  . Cancer Mother     Cancer of bladder and brain per medical history form dated 02/25/10.    History  Substance Use Topics  . Smoking status: Never Smoker   . Smokeless tobacco: Not on file  . Alcohol Use: No    OB History    Grav Para Term Preterm Abortions TAB SAB Ect Mult Living                  Review of Systems  Constitutional: Negative for fever and chills.  Musculoskeletal: Negative for joint swelling.  Skin: Positive for wound. Negative for color change and rash.    Allergies  Codeine  Home Medications   Current Outpatient Rx  Name Route Sig Dispense Refill  . ALPRAZOLAM 0.25 MG PO TABS Oral Take 0.25 mg by  mouth at bedtime as needed.      Marland Kitchen DOXYCYCLINE HYCLATE 100 MG PO CAPS Oral Take 1 capsule (100 mg total) by mouth 2 (two) times daily. 14 capsule 0  . VENLAFAXINE HCL ER 75 MG PO CP24 Oral Take 1 capsule (75 mg total) by mouth daily. 90 capsule 1    BP 146/96  Pulse 76  Temp(Src) 98.5 F (36.9 C) (Oral)  Resp 18  SpO2 98%  Physical Exam  Nursing note and vitals reviewed. Constitutional: She appears well-developed and well-nourished. No distress.  HENT:  Head: Normocephalic and atraumatic.  Cardiovascular:  Pulses:      Dorsalis pedis pulses are 2+ on the right side.       Posterior tibial pulses are 2+ on the right side.  Musculoskeletal:       Right foot: She exhibits tenderness. She exhibits normal range of motion, no bony tenderness, no swelling, normal capillary refill, no crepitus, no deformity and no laceration.       Mid Rt plantar aspect of heel 2 mm scab noted with very mild surrounding erythema and swelling 1 cm diameter - + TTP. No visible foreign body seen.   Skin: Skin is warm and dry.    ED Course  FOREIGN BODY REMOVAL  Date/Time: 08/07/2011 3:20 PM Performed by: Melody Comas Authorized by: Melody Comas Consent: Verbal consent obtained. Written consent not obtained. Risks and benefits: risks, benefits and alternatives were discussed Consent given by: patient Patient understanding: patient states understanding of the procedure being performed Patient consent: the patient's understanding of the procedure matches consent given Patient identity confirmed: verbally with patient Body area: skin Anesthesia: local infiltration Local anesthetic: lidocaine 2% without epinephrine Anesthetic total: 1 ml Patient restrained: no Patient cooperative: yes Localization method: visualized Dressing: antibiotic ointment Tendon involvement: none Depth: subcutaneous Complexity: simple 0 objects recovered. Post-procedure assessment: foreign body not removed Patient  tolerance: Patient tolerated the procedure well with no immediate complications. Comments: Scab removed and pus expressed. No f/b visualized. Puncture site explored but no f/b found.    (including critical care time)  Labs Reviewed - No data to display Dg Foot Complete Right  08/07/2011  *RADIOLOGY REPORT*  Clinical Data: 56 year old female with right heel wound, penetrating injury.  Query foreign body.  RIGHT FOOT COMPLETE - 3+ VIEW  Comparison: None.  Findings: Skin marker placed at the area of the wound which is subjacent to the posterior calcaneus.  Skin and soft tissue thickening in the region. No radiopaque foreign body identified. No subcutaneous gas.  Calcaneus intact. Bone mineralization is within normal limits.  No fracture or dislocation.  IMPRESSION: 1. No radiopaque foreign body identified. 2. No acute osseous abnormality identified about the right foot.  Original Report Authenticated By: Ulla Potash III, M.D.     1. Puncture wound of right foot       MDM  No f/b seen on xray.  Explored puncture site for possible retained foreign body. Pus was expressed, but no f/b identified. Dressing applied.       Melody Comas, [place] 08/07/11 [phone]

## 2011-08-07 NOTE — Discharge Instructions (Signed)
Warm water soaks 3-4 times a day for 15-20 minutes.  Apply antibiotic ointment and a clean dressing twice a day until the opening heals. Return if symptoms change or worsen.  Puncture Wound A puncture wound is an injury that extends through all layers of the skin and into the tissue beneath the skin (subcutaneous tissue). Puncture wounds become infected easily because germs often enter the body and go beneath the skin during the injury. Having a deep wound with a small entrance point makes it difficult for your caregiver to adequately clean the wound. This is especially true if you have stepped on a nail and it has passed through a dirty shoe or other situations where the wound is obviously contaminated. CAUSES  Many puncture wounds involve glass, nails, splinters, fish hooks, or other objects that enter the skin (foreign bodies). A puncture wound may also be caused by a human bite or animal bite. DIAGNOSIS  A puncture wound is usually diagnosed by your history and a physical exam. You may need to have an X-ray or an ultrasound to check for any foreign bodies still in the wound. TREATMENT   Your caregiver will clean the wound as thoroughly as possible. Depending on the location of the wound, a bandage (dressing) may be applied.   Your caregiver might prescribe antibiotic medicines.   You may need a follow-up visit to check on your wound. Follow all instructions as directed by your caregiver.  HOME CARE INSTRUCTIONS   Change your dressing once per day, or as directed by your caregiver. If the dressing sticks, it may be removed by soaking the area in water.   If your caregiver has given you follow-up instructions, it is very important that you return for a follow-up appointment. Not following up as directed could result in a chronic or permanent injury, pain, and disability.   Only take over-the-counter or prescription medicines for pain, discomfort, or fever as directed by your caregiver.    If you are given antibiotics, take them as directed. Finish them even if you start to feel better.  You may need a tetanus shot if:  You cannot remember when you had your last tetanus shot.   You have never had a tetanus shot.  If you got a tetanus shot, your arm may swell, get red, and feel warm to the touch. This is common and not a problem. If you need a tetanus shot and you choose not to have one, there is a rare chance of getting tetanus. Sickness from tetanus can be serious. You may need a rabies shot if an animal bite caused your puncture wound. SEEK MEDICAL CARE IF:   You have redness, swelling, or increasing pain in the wound.   You have red streaks going away from the wound.   You notice a bad smell coming from the wound or dressing.   You have yellowish-white fluid (pus) coming from the wound.   You are treated with an antibiotic for infection, but the infection is not getting better.   You notice something in the wound, such as rubber from your shoe, cloth, or another object.   You have a fever.   You have severe pain.   You have difficulty breathing.   You feel dizzy or faint.   You cannot stop vomiting.   You lose feeling, develop numbness, or cannot move a limb below the wound.   Your symptoms worsen.  MAKE SURE YOU:  Understand these instructions.   Will watch  your condition.   Will get help right away if you are not doing well or get worse.  Document Released: 01/14/2005 Document Revised: 03/26/2011 Document Reviewed: 09/23/2010 [facility] Patient Information 2012 [place], [place].

## 2011-08-07 NOTE — ED Notes (Signed)
Pt  Reports  6  Days  Ago  Was  Working outside  And  Possibly   Got a  Splinter in  r  Foot        She  Attempted  To  Remove  It  But feels  Like  Something still  There

## 2011-08-08 NOTE — ED Provider Notes (Signed)
Medical screening examination/treatment/procedure(s) were performed by resident physician or non-physician practitioner and as supervising physician I was immediately available for consultation/collaboration.   Barkley Bruns MD.    Linna Hoff, MD 08/08/11 [phone]

## 2011-08-13 ENCOUNTER — Encounter: Payer: Self-pay | Admitting: Radiation Oncology

## 2011-08-13 ENCOUNTER — Ambulatory Visit
Admission: RE | Admit: 2011-08-13 | Discharge: 2011-08-13 | Disposition: A | Discharge status: Home / Self Care | Payer: 59 | Source: Ambulatory Visit | Attending: Radiation Oncology | Admitting: Radiation Oncology

## 2011-08-13 VITALS — BP 148/91 | HR 77 | Temp 97.4°F | Resp 18 | Wt 193.2 lb

## 2011-08-13 DIAGNOSIS — C50919 Malignant neoplasm of unspecified site of unspecified female breast: Secondary | ICD-10-CM

## 2011-08-13 DIAGNOSIS — C801 Malignant (primary) neoplasm, unspecified: Secondary | ICD-10-CM

## 2011-08-13 NOTE — Progress Notes (Signed)
  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  She is feeling well and doing well. Her energy levels are back to normal. She has occasional swelling underneath her left arm. This is nonpainful. It is in the site of her node dissection scar. She is pleased with her cosmetic result. She had negative mammograms in October. She is not on antiestrogen therapy. She is not following with anyone except for me.                              ALLERGIES:  is allergic to codeine.  Meds: Current Outpatient Prescriptions  Medication Sig Dispense Refill  . ALPRAZolam (XANAX) 0.25 MG tablet Take 0.25 mg by mouth at bedtime as needed.        . doxycycline (VIBRAMYCIN) 100 MG capsule Take 1 capsule (100 mg total) by mouth 2 (two) times daily.  14 capsule  0  . venlafaxine (EFFEXOR-XR) 75 MG 24 hr capsule Take 1 capsule (75 mg total) by mouth daily.  90 capsule  1    Physical Findings: The patient is in no acute distress. Patient is alert and oriented. . no palpable abnormalities of the right breast. No palpable right axillary adenopathy. Examination of the left breast reveals a scar in the inferior aspect of the breast. She has some scar tissue palpable in and around this area. No worrisome signs of disease recurrence. In her left axilla I am not able to palpate any enlarged lymph nodes. There is no fluid accumulation there at this time. He is really no skin changes. No hyperpigmentation.  weight is 193 lb 3.2 oz (87.635 kg). Her oral temperature is 97.4 F (36.3 C). Her blood pressure is 148/91 and her pulse is 77. Her respiration is 18. .  No significant changes.  Lab  Findings: Lab Results  Component Value Date   WBC 6.5 06/19/2011   HGB 14.1 06/19/2011   HCT 41.9 06/19/2011   MCV 80.2 06/19/2011   PLT 241 06/19/2011      Radiographic Findings: Dg Foot Complete Right  08/07/2011  *RADIOLOGY REPORT*  Clinical Data: 56 year old female with right heel wound, penetrating injury.  Query foreign body.  RIGHT FOOT COMPLETE - 3+ VIEW  Comparison: None.  Findings: Skin marker placed at the area of the wound which is subjacent to the posterior calcaneus.  Skin and soft tissue thickening in the region. No radiopaque foreign body identified. No subcutaneous gas.  Calcaneus intact. Bone mineralization is within normal limits.  No fracture or dislocation.  IMPRESSION: 1. No radiopaque foreign body identified. 2. No acute osseous abnormality identified about the right foot.  Original Report Authenticated By: Harley Hallmark, M.D.    Impression:  The patient is recovering from the effects of radiation.  She has no evidence of disease  Plan:  I will plan on seeing her back in 6 months. I have put in orders for her mammograms in October. She knows to contact me with any questions or concerns.  _____________________________________

## 2011-08-13 NOTE — Progress Notes (Signed)
HERE TODAY FOR FU LEFT BREAST.  SKIN LOOKS GREAT, NO C/O TODAY.

## 2011-08-14 ENCOUNTER — Encounter: Payer: Self-pay | Admitting: *Deleted

## 2011-08-14 NOTE — Progress Notes (Signed)
Late entry for 08/13/2011: Patient in to clinic on 08/13/2011 for Month 12 visit. Questionnaires completed by patient upon arrival. RTOG SOMA form completed by Dr. Michell Heinrich. Patient has not been using any skin products over the past six months. Herceptin dosing was completed in December; she is not a candidate for hormonal therapy due to ER/PR negative tumor status. She has had no recurrence of her breast cancer and has not received any additional chemotherapy. Patient is aware although this is the final study visit, there is the possibility of a change to the study extending to 18 months. Patient states she is willing to participate in that case, and welcomes any notification of future changes that may occur in the study. Thanked patient for her participation.

## 2011-09-29 ENCOUNTER — Other Ambulatory Visit: Payer: Self-pay | Admitting: *Deleted

## 2011-09-29 ENCOUNTER — Other Ambulatory Visit: Payer: Self-pay | Admitting: Oncology

## 2011-09-29 DIAGNOSIS — C50919 Malignant neoplasm of unspecified site of unspecified female breast: Secondary | ICD-10-CM

## 2011-09-29 NOTE — Telephone Encounter (Signed)
Discussed refill request for ambien with Debbora Presto PA.  Will refill #30 until Thayer Ohm can discuss with patient on visit 10/26/11.

## 2011-10-19 ENCOUNTER — Other Ambulatory Visit: Payer: 59 | Admitting: Lab

## 2011-10-19 DIAGNOSIS — D0510 Intraductal carcinoma in situ of unspecified breast: Secondary | ICD-10-CM

## 2011-10-19 LAB — CBC WITH DIFFERENTIAL/PLATELET
BASO%: 0.6 % (ref 0.0–2.0)
Eosinophils Absolute: 0.2 10*3/uL (ref 0.0–0.5)
HCT: 39.8 % (ref 34.8–46.6)
LYMPH%: 25.5 % (ref 14.0–49.7)
MCHC: 33.3 g/dL (ref 31.5–36.0)
MONO#: 0.3 10*3/uL (ref 0.1–0.9)
NEUT#: 4.1 10*3/uL (ref 1.5–6.5)
NEUT%: 65.5 % (ref 38.4–76.8)
Platelets: 246 10*3/uL (ref 145–400)
RBC: 4.98 10*6/uL (ref 3.70–5.45)
WBC: 6.3 10*3/uL (ref 3.9–10.3)
lymph#: 1.6 10*3/uL (ref 0.9–3.3)

## 2011-10-19 LAB — COMPREHENSIVE METABOLIC PANEL
AST: 20 U/L (ref 0–37)
Albumin: 3.8 g/dL (ref 3.5–5.2)
Alkaline Phosphatase: 85 U/L (ref 39–117)
Calcium: 9.1 mg/dL (ref 8.4–10.5)
Chloride: 103 mEq/L (ref 96–112)
Glucose, Bld: 149 mg/dL — ABNORMAL HIGH (ref 70–99)
Potassium: 4.5 mEq/L (ref 3.5–5.3)
Sodium: 138 mEq/L (ref 135–145)
Total Protein: 6.4 g/dL (ref 6.0–8.3)

## 2011-10-19 LAB — CANCER ANTIGEN 27.29: CA 27.29: 12 U/mL (ref 0–39)

## 2011-10-25 ENCOUNTER — Other Ambulatory Visit: Payer: Self-pay | Admitting: Physician Assistant

## 2011-10-25 DIAGNOSIS — C50919 Malignant neoplasm of unspecified site of unspecified female breast: Secondary | ICD-10-CM

## 2011-10-26 ENCOUNTER — Encounter: Payer: Self-pay | Admitting: Physician Assistant

## 2011-10-26 ENCOUNTER — Telehealth: Payer: Self-pay | Admitting: Oncology

## 2011-10-26 ENCOUNTER — Ambulatory Visit (HOSPITAL_BASED_OUTPATIENT_CLINIC_OR_DEPARTMENT_OTHER): Payer: 59 | Admitting: Physician Assistant

## 2011-10-26 VITALS — BP 136/81 | HR 92 | Temp 98.3°F | Ht 61.0 in | Wt 197.5 lb

## 2011-10-26 DIAGNOSIS — C50319 Malignant neoplasm of lower-inner quadrant of unspecified female breast: Secondary | ICD-10-CM

## 2011-10-26 DIAGNOSIS — N959 Unspecified menopausal and perimenopausal disorder: Secondary | ICD-10-CM

## 2011-10-26 DIAGNOSIS — C50919 Malignant neoplasm of unspecified site of unspecified female breast: Secondary | ICD-10-CM

## 2011-10-26 DIAGNOSIS — Z171 Estrogen receptor negative status [ER-]: Secondary | ICD-10-CM

## 2011-10-26 NOTE — Telephone Encounter (Signed)
gve the pt her nov 2013 appt calendar °

## 2011-10-27 NOTE — Progress Notes (Signed)
Hematology and Oncology Follow Up Visit  Tracy Barnes [id number] 01-12-1956 56 y.o. 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  CISH ratio 2.79, Ki-67 60%.  She then underwent a left lumpectomy with sentinel node dissection which revealed residual high-grade DCIS with negative margins and 4 sentinel nodes clear of metastatic disease. She completed 9 weekly doses of Taxol/Herceptin followed by radiation therapy with continued weekly Herceptin, she completed q. three-week maintenance Herceptin on 04/17/2011. Now on observation alone.  Interim History:   Tracy Barnes is doing well.  She denies any fevers, chills, night sweats, shortness of breath, or chest pain. No nausea, emesis, diarrhea, or constipation issues. She is unaware of any palpable breast masses. She would be due for bilateral diagnostic mammogram October of 2013 at the [facility]. Energy level is excellent, she is working full-time without difficulty. She is sleeping well, but does experience hot flashes. She denies any breast tenderness, she denies any bleeding or bruising symptoms. No evidence of neuropathy.  A detailed review of systems is otherwise noncontributory as noted below.  Review of Systems: Constitutional:  no weight loss, fever, night sweats and feels well Eyes: no complaints ENT: no complaints Cardiovascular: no chest pain or dyspnea on exertion Respiratory: no cough, shortness of breath, or wheezing Neurological: no TIA or stroke symptoms Dermatological: negative Gastrointestinal: no abdominal pain, change in bowel habits, or black or bloody stools Genito-Urinary: no dysuria, trouble voiding, or hematuria Hematological and Lymphatic: negative Breast: negative for breast lumps Musculoskeletal: negative Remaining ROS  negative.   Medications:   I have reviewed the patient's current medications.  Current Outpatient Prescriptions  Medication Sig Dispense Refill  . ALPRAZolam (XANAX) 0.25 MG tablet Take 0.25 mg by mouth at bedtime as needed.        . venlafaxine (EFFEXOR-XR) 75 MG 24 hr capsule Take 1 capsule (75 mg total) by mouth daily.  90 capsule  1  . zolpidem (AMBIEN) 5 MG tablet Take 5 mg by mouth at bedtime as needed.        Allergies:  Allergies  Allergen Reactions  . Codeine Itching    Updated per Mosaiq.    Physical Exam: Filed Vitals:   10/26/11 1428  BP: 136/81  Pulse: 92  Temp: 98.3 F (36.8 C)    Body mass index is 37.32 kg/(m^2). Weight: 197 lbs. HEENT:  Sclerae anicteric, conjunctivae pink.  Oropharynx clear.  No mucositis or candidiasis.   Nodes:  No cervical, supraclavicular, or axillary lymphadenopathy palpated.  Breast Exam: The left breast was examined, the lumpectomy scar in the inferior portion is benign without any dominant mass effect, no frank nipple inversion no evidence of discharge. There is no palpable lymphadenopathy within the left axilla. The right breast was examined, it is free of masses, skin changes, nipple inversion or discharge. Axilla is also clear. Lungs:  Clear to auscultation bilaterally.  No crackles, rhonchi, or wheezes.   Heart:  Regular rate and rhythm.   Abdomen:  Soft, nontender.  Positive bowel sounds.  No organomegaly or masses palpated.   Musculoskeletal:  No focal spinal tenderness to palpation.  Extremities:  Benign.  No peripheral edema or cyanosis.   Skin:  Benign.   Neuro:  Nonfocal, alert and oriented x 3.   Lab Results: Lab Results  Component Value Date   WBC  6.3 10/19/2011   HGB 13.3 10/19/2011   HCT 39.8 10/19/2011   MCV 79.9 10/19/2011   PLT 246 10/19/2011   NEUTROABS 4.1 10/19/2011     Chemistry      Component Value Date/Time   NA 138 10/19/2011 1446   K 4.5 10/19/2011 1446   CL 103 10/19/2011 1446   CO2 24 10/19/2011 1446   BUN 18  10/19/2011 1446   CREATININE 0.76 10/19/2011 1446      Component Value Date/Time   CALCIUM 9.1 10/19/2011 1446   ALKPHOS 85 10/19/2011 1446   AST 20 10/19/2011 1446   ALT 18 10/19/2011 1446   BILITOT 0.2* 10/19/2011 1446      Lab Results  Component Value Date   LABCA2 12 10/19/2011    2D Echocardiogram: 05/26/11 Study Conclusions - Left ventricle: The cavity size was normal. Wall thickness was normal. Systolic function was normal. The estimated ejection fraction was in the range of 50% to 55%. Wall motion was normal; there were no regional wall motion abnormalities. Left ventricular diastolic function parameters were normal. - Atrial septum: No defect or patent foramen ovale was identified. Transthoracic echocardiography. M-mode, complete 2D, spectral Doppler, and color Doppler. Height: Height: 160cm. Height: 63in. Weight: Weight: 81.8kg. Weight: 180lb. Body mass index: BMI: 32kg/m^2. Body surface area: BSA: 1.33m^2. Blood pressure: 149/100. Patient status: Outpatient. Location: Echo laboratory. 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  CISH ratio 2.79, Ki-67 60%.  She then underwent a left lumpectomy with sentinel node dissection which revealed residual high-grade DCIS with negative margins and 4 sentinel nodes clear of metastatic disease. She completed 9 weekly doses of Taxol/Herceptin followed by radiation therapy with continued weekly Herceptin, she completed q. three-week maintenance Herceptin on 04/17/2011. Now on observation alone.  Case has been reviewed with Dr. Pierce Crane.  Plan:  Tracy Barnes will return to see [place] on a 4 month interval, we will obtain a CBC, serum chemistry, LDH, and CA 27-29 the week prior.  We will try Peridin-C, I  have also refilled Ambien, though at 5mg . She knows to contact us in the interim if the need should arise. This plan was reviewed with the patient, who voices understanding and agreement.  She knows to call with any changes or problems.    Tracy Eddinger T, PA-C 10/26/11

## 2011-12-17 ENCOUNTER — Encounter: Payer: 59 | Attending: Family Medicine | Admitting: *Deleted

## 2011-12-17 ENCOUNTER — Encounter: Payer: Self-pay | Admitting: *Deleted

## 2011-12-17 VITALS — Ht 63.0 in | Wt 197.0 lb

## 2011-12-17 DIAGNOSIS — R7303 Prediabetes: Secondary | ICD-10-CM

## 2011-12-17 NOTE — Progress Notes (Signed)
  Patient was seen on 12/17/11 for the first of a series of three diabetes self-management courses at the [facility]. The following learning objectives were met by the patient during this course:   Defines the role of glucose and insulin  Identifies type of diabetes and pathophysiology  Defines the diagnostic criteria for diabetes and prediabetes  States the risk factors for Type 2 Diabetes  States the symptoms of Type 2 Diabetes  Defines Type 2 Diabetes treatment goals  Defines Type 2 Diabetes treatment options  States the rationale for glucose monitoring  Identifies A1C, glucose targets, and testing times  Identifies proper sharps disposal  Defines the purpose of a diabetes food plan  Identifies carbohydrate food groups  Defines effects of carbohydrate foods on glucose levels  Identifies carbohydrate choices/grams/food labels  States benefits of physical activity and effect on glucose  Review of suggested activity guidelines  Handouts given during class include:  Type 2 Diabetes: Basics Book  My Food Plan Book  Food and Activity Log   Follow-Up Plan: Attend core 2 and 3 

## 2011-12-17 NOTE — Patient Instructions (Signed)
Goals:  Follow Diabetes Meal Plan as instructed  Eat 3 meals and 2 snacks, every 3-5 hrs  Limit carbohydrate intake to 30-45 grams carbohydrate/meal  Limit carbohydrate intake to 15 grams carbohydrate/snack  Add lean protein foods to meals/snacks  Monitor glucose levels as instructed by your doctor  Aim for 30 mins of physical activity daily  Bring food record and glucose log to your next nutrition visit 

## 2011-12-25 ENCOUNTER — Other Ambulatory Visit: Payer: Self-pay | Admitting: Oncology

## 2011-12-25 DIAGNOSIS — C50919 Malignant neoplasm of unspecified site of unspecified female breast: Secondary | ICD-10-CM

## 2012-01-07 ENCOUNTER — Encounter: Payer: 59 | Attending: Family Medicine | Admitting: Dietician

## 2012-01-08 NOTE — Progress Notes (Signed)
  Patient was seen on 01/07/2012 for the second of a series of three diabetes self-management courses at the [facility]. The following learning objectives were met by the patient during this course:   Explain basic nutrition maintenance and quality assurance  Describe causes, symptoms and treatment of hypoglycemia and hyperglycemia  Explain how to manage diabetes during illness  Describe the importance of good nutrition for health and healthy eating strategies  List strategies to follow meal plan when dining out  Describe the effects of alcohol on glucose and how to use it safely  Describe problem solving skills for day-to-day glucose challenges  Describe strategies to use when treatment plan needs to change  Identify important factors involved in successful weight loss  Describe ways to remain physically active  Describe the impact of regular activity on insulin resistance    Handouts given in class:  Refrigerator magnet for Sick Day Guidelines  [facility] Oral medication/insulin handout  [facility] Weight Loss Handout  Blood Glucose Problem Solving (Class Exercise)  Follow-Up Plan: Patient will attend the final class of the ADA Diabetes Self-Care Education.   

## 2012-02-09 ENCOUNTER — Ambulatory Visit
Admission: RE | Admit: 2012-02-09 | Discharge: 2012-02-09 | Disposition: A | Discharge status: Home / Self Care | Payer: 59 | Source: Ambulatory Visit | Attending: Radiation Oncology | Admitting: Radiation Oncology

## 2012-02-09 ENCOUNTER — Other Ambulatory Visit: Payer: Self-pay | Admitting: Radiation Oncology

## 2012-02-09 ENCOUNTER — Other Ambulatory Visit: Payer: Self-pay | Admitting: Diagnostic Radiology

## 2012-02-09 DIAGNOSIS — C801 Malignant (primary) neoplasm, unspecified: Secondary | ICD-10-CM

## 2012-02-11 ENCOUNTER — Ambulatory Visit: Payer: 59 | Admitting: Radiation Oncology

## 2012-02-18 ENCOUNTER — Encounter: Payer: Self-pay | Admitting: Radiation Oncology

## 2012-02-18 ENCOUNTER — Encounter: Payer: 59 | Attending: Family Medicine | Admitting: *Deleted

## 2012-02-18 ENCOUNTER — Ambulatory Visit
Admission: RE | Admit: 2012-02-18 | Discharge: 2012-02-18 | Disposition: A | Discharge status: Home / Self Care | Payer: 59 | Source: Ambulatory Visit | Attending: Radiation Oncology | Admitting: Radiation Oncology

## 2012-02-18 VITALS — BP 126/74 | HR 92 | Temp 98.0°F | Resp 20 | Wt 201.6 lb

## 2012-02-18 DIAGNOSIS — Z713 Dietary counseling and surveillance: Secondary | ICD-10-CM | POA: Insufficient documentation

## 2012-02-18 DIAGNOSIS — E119 Type 2 diabetes mellitus without complications: Secondary | ICD-10-CM | POA: Insufficient documentation

## 2012-02-18 DIAGNOSIS — C50919 Malignant neoplasm of unspecified site of unspecified female breast: Secondary | ICD-10-CM

## 2012-02-18 NOTE — Progress Notes (Signed)
Patient here f/u left breastrad tx :06/18/10-08/04/10 Alert,oriented x3, had 02/09/12 left breast bx  7 0'clock =benign breast  Fat necrosiis, well healed, twinges in breast occasionally,eating  Diet for carbs, pre-diabetic, walking 30 minutes daily, diet controlled so far 3:22 PM

## 2012-02-18 NOTE — Progress Notes (Signed)
  Patient was seen on 02/18/12 for the third of a series of three diabetes self-management courses at the [facility]. The following learning objectives were met by the patient during this course:    Describe how diabetes changes over time   Identify diabetes complications and ways to prevent them   Describe strategies that can promote heart health including lowering blood pressure and cholesterol   Describe strategies to lower dietary fat and sodium in the diet   Identify physical activities that benefit cardiovascular health   Evaluate success in meeting personal goal   Describe the belief that they can live successfully with diabetes day to day   Establish 2-3 goals that they will plan to diligently work on until they return for the free 27-month follow-up visit  The following handouts were given in class:  3 Month Follow Up Visit handout  Goal setting handout  Class evaluation form  Your patient has established the following 3 month goals for diabetes self-care:  Count carbohydrates at most meals and snacks  Be active 20 minutes 5 times a week  Reduce fat in my diet  Follow-Up Plan: Patient will attend a 3 month follow-up visit for diabetes self-management education.

## 2012-02-18 NOTE — Patient Instructions (Signed)
Goals:  Follow Diabetes Meal Plan as instructed  Eat 3 meals and 2 snacks, every 3-5 hrs  Limit carbohydrate intake to 30-45 grams carbohydrate/meal  Limit carbohydrate intake to 15 grams carbohydrate/snack  Add lean protein foods to meals/snacks  Monitor glucose levels as instructed by your doctor  Aim for 30 mins of physical activity daily  Bring food record and glucose log to your next nutrition visit 

## 2012-02-18 NOTE — Progress Notes (Signed)
   Department of Radiation Oncology  Phone:  [phone] Fax:        [phone]   Name: LILIBETH OPIE   DOB: 31-Jan-1956  MRN: [id number]    Date: 02/18/2012  Follow Up Visit Note  Diagnosis: T1cN0 Invasive Ductal Carcinoma of the left breast  Interval since last radiation: 18 months  Interval History: Kamry presents today for routine followup.  She underwent her routine screening mammogram in October. This showed some new calcifications. These were biopsied on the 22nd and were found to be benign. She has a followup appointment with Dr. Donnie Coffin on November 12. She has no breast related complaints.  Allergies:  Allergies  Allergen Reactions  . Codeine Itching    Updated per Mosaiq.    Medications:  Current Outpatient Prescriptions  Medication Sig Dispense Refill  . ALPRAZolam (XANAX) 0.25 MG tablet Take 0.25 mg by mouth at bedtime as needed.        . venlafaxine XR (EFFEXOR-XR) 75 MG 24 hr capsule TAKE 1 CAPSULE BY MOUTH DAILY  90 capsule  0  . zolpidem (AMBIEN) 5 MG tablet Take 5 mg by mouth at bedtime as needed.        Physical Exam:   weight is 201 lb 9.6 oz (91.445 kg). Her oral temperature is 98 F (36.7 C). Her blood pressure is 126/74 and her pulse is 92. Her respiration is 20.  She is a pleasant female in no distress sitting comfortably examining table.  IMPRESSION: Gena is a 56 y.o. female status post breast conservation with recent negative breast biopsy and no evidence of disease  PLAN:  Jourdan looks great. It is unfortunate that biopsy was benign. She is following with Dr. Donnie Coffin. I have not scheduled followup with me. I be happy to see her back at any point in the future.    Lurline Hare, MD

## 2012-02-22 ENCOUNTER — Ambulatory Visit: Payer: 59 | Admitting: *Deleted

## 2012-02-23 ENCOUNTER — Other Ambulatory Visit (HOSPITAL_BASED_OUTPATIENT_CLINIC_OR_DEPARTMENT_OTHER): Payer: 59 | Admitting: Lab

## 2012-02-23 DIAGNOSIS — C50919 Malignant neoplasm of unspecified site of unspecified female breast: Secondary | ICD-10-CM

## 2012-02-23 LAB — CBC WITH DIFFERENTIAL/PLATELET
BASO%: 0.4 % (ref 0.0–2.0)
EOS%: 2.4 % (ref 0.0–7.0)
HCT: 39.2 % (ref 34.8–46.6)
LYMPH%: 20.9 % (ref 14.0–49.7)
MCH: 25.7 pg (ref 25.1–34.0)
MCHC: 33.5 g/dL (ref 31.5–36.0)
MCV: 76.7 fL — ABNORMAL LOW (ref 79.5–101.0)
MONO#: 0.4 10*3/uL (ref 0.1–0.9)
MONO%: 5.1 % (ref 0.0–14.0)
NEUT%: 71.2 % (ref 38.4–76.8)
Platelets: 278 10*3/uL (ref 145–400)
RBC: 5.12 10*6/uL (ref 3.70–5.45)

## 2012-02-23 LAB — COMPREHENSIVE METABOLIC PANEL (CC13)
ALT: 26 U/L (ref 0–55)
AST: 23 U/L (ref 5–34)
BUN: 18 mg/dL (ref 7.0–26.0)
Calcium: 9.4 mg/dL (ref 8.4–10.4)
Chloride: 104 mEq/L (ref 98–107)
Creatinine: 0.8 mg/dL (ref 0.6–1.1)
Total Bilirubin: 0.22 mg/dL (ref 0.20–1.20)

## 2012-02-23 LAB — CANCER ANTIGEN 27.29: CA 27.29: 11 U/mL (ref 0–39)

## 2012-02-29 ENCOUNTER — Telehealth: Payer: Self-pay | Admitting: *Deleted

## 2012-02-29 NOTE — Telephone Encounter (Signed)
per md not going to be in the office on 03-01-2012 moved patient appointment to 03-14-2012 at 1:30pm patient confirmed over the phone

## 2012-03-01 ENCOUNTER — Ambulatory Visit: Payer: 59 | Admitting: Oncology

## 2012-03-02 ENCOUNTER — Other Ambulatory Visit: Payer: Self-pay | Admitting: *Deleted

## 2012-03-02 DIAGNOSIS — C50919 Malignant neoplasm of unspecified site of unspecified female breast: Secondary | ICD-10-CM

## 2012-03-02 MED ORDER — ZOLPIDEM TARTRATE 5 MG PO TABS: 5.0000 mg | ORAL_TABLET | Freq: Every evening | ORAL | Status: DC | PRN

## 2012-03-02 NOTE — Telephone Encounter (Signed)
Per Dr. Donnie Coffin: Refill Ambien X 2 and have PCP take over future fills. Patient is out of treatment almost 1 year.

## 2012-03-14 ENCOUNTER — Ambulatory Visit (HOSPITAL_BASED_OUTPATIENT_CLINIC_OR_DEPARTMENT_OTHER): Payer: 59 | Admitting: Oncology

## 2012-03-14 ENCOUNTER — Telehealth: Payer: Self-pay | Admitting: Oncology

## 2012-03-14 VITALS — BP 148/82 | HR 86 | Temp 98.4°F | Resp 20 | Ht 63.0 in | Wt 201.8 lb

## 2012-03-14 DIAGNOSIS — D0510 Intraductal carcinoma in situ of unspecified breast: Secondary | ICD-10-CM

## 2012-03-14 DIAGNOSIS — D0590 Unspecified type of carcinoma in situ of unspecified breast: Secondary | ICD-10-CM

## 2012-03-14 DIAGNOSIS — Z171 Estrogen receptor negative status [ER-]: Secondary | ICD-10-CM

## 2012-03-14 NOTE — Telephone Encounter (Signed)
gve the pt her June 2014 appt calendar 

## 2012-03-14 NOTE — Progress Notes (Signed)
Hematology and Oncology Follow Up Visit  Tracy Barnes [id number] [birth year] 56 y.o. 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  CISH ratio 2.79, Ki-67 60%.  She then underwent a left lumpectomy with sentinel node dissection which revealed residual high-grade DCIS with negative margins and 4 sentinel nodes clear of metastatic disease. She completed 9 weekly doses of Taxol/Herceptin followed by radiation therapy with continued weekly Herceptin, she completed q. three-week maintenance Herceptin on 04/17/2011. Now on observation alone.  Interim History:   Tracy Barnes is doing well.  She denies any fevers, chills, night sweats, shortness of breath, or chest pain. No nausea, emesis, diarrhea, or constipation issues. She is unaware of any palpable breast masses.she continues to work full time. She had a mammogram in October which led to a breast biopsy of her left breast, that revealed fat necrosis.  A detailed review of systems is otherwise noncontributory as noted below.  Review of Systems: Constitutional:  no weight loss, fever, night sweats and feels well Eyes: no complaints ENT: no complaints Cardiovascular: no chest pain or dyspnea on exertion Respiratory: no cough, shortness of breath, or wheezing Neurological: no TIA or stroke symptoms Dermatological: negative Gastrointestinal: no abdominal pain, change in bowel habits, or black or bloody stools Genito-Urinary: no dysuria, trouble voiding, or hematuria Hematological and Lymphatic: negative Breast: negative for breast lumps Musculoskeletal: negative Remaining ROS negative.   Medications:   I have reviewed the patient's current medications.  Current Outpatient Prescriptions  Medication Sig Dispense Refill  . ALPRAZolam (XANAX) 0.25 MG tablet Take 0.25 mg by  mouth at bedtime as needed.        . venlafaxine XR (EFFEXOR-XR) 75 MG 24 hr capsule TAKE 1 CAPSULE BY MOUTH DAILY  90 capsule  0  . zolpidem (AMBIEN) 5 MG tablet Take 1 tablet (5 mg total) by mouth at bedtime as needed for sleep. FUTURE REFILLS PER PCP  30 tablet  1    Allergies:  Allergies  Allergen Reactions  . Codeine Itching    Updated per Mosaiq.    Physical Exam: Filed Vitals:   03/14/12 1358  BP: 148/82  Pulse: 86  Temp: 98.4 F (36.9 C)  Resp: 20    Body mass index is 35.75 kg/(m^2). Weight: 197 lbs. HEENT:  Sclerae anicteric, conjunctivae pink.  Oropharynx clear.  No mucositis or candidiasis.   Nodes:  No cervical, supraclavicular, or axillary lymphadenopathy palpated.  Breast Exam: The left breast was examined, the lumpectomy scar in the inferior portion is benign without any dominant mass effect, no frank nipple inversion no evidence of discharge. There is a recent biopsy site. There is no palpable lymphadenopathy within the left axilla. The right breast was examined, it is free of masses, skin changes, nipple inversion or discharge. Axilla is also clear. Lungs:  Clear to auscultation bilaterally.  No crackles, rhonchi, or wheezes.   Heart:  Regular rate and rhythm.   Abdomen:  Soft, nontender.  Positive bowel sounds.  No organomegaly or masses palpated.   Musculoskeletal:  No focal spinal tenderness to palpation.  Extremities:  Benign.  No peripheral edema or cyanosis.   Skin:  Benign.   Neuro:  Nonfocal, alert and oriented x 3.   Lab Results: Lab Results  Component Value Date   WBC 7.5 02/23/2012   HGB 13.1 02/23/2012  HCT 39.2 02/23/2012   MCV 76.7* 02/23/2012   PLT 278 02/23/2012   NEUTROABS 5.3 02/23/2012     Chemistry      Component Value Date/Time   NA 136 02/23/2012 1533   NA 138 10/19/2011 1446   K 3.4* 02/23/2012 1533   K 4.5 10/19/2011 1446   CL 104 02/23/2012 1533   CL 103 10/19/2011 1446   CO2 24 02/23/2012 1533   CO2 24 10/19/2011 1446   BUN 18.0  02/23/2012 1533   BUN 18 10/19/2011 1446   CREATININE 0.8 02/23/2012 1533   CREATININE 0.76 10/19/2011 1446      Component Value Date/Time   CALCIUM 9.4 02/23/2012 1533   CALCIUM 9.1 10/19/2011 1446   ALKPHOS 98 02/23/2012 1533   ALKPHOS 85 10/19/2011 1446   AST 23 02/23/2012 1533   AST 20 10/19/2011 1446   ALT 26 02/23/2012 1533   ALT 18 10/19/2011 1446   BILITOT 0.22 02/23/2012 1533   BILITOT 0.2* 10/19/2011 1446      Lab Results  Component Value Date   LABCA2 11 02/23/2012    2D Echocardiogram: 05/26/11 Study Conclusions - Left ventricle: The cavity size was normal. Wall thickness was normal. Systolic function was normal. The estimated ejection fraction was in the range of 50% to 55%. Wall motion was normal; there were no regional wall motion abnormalities. Left ventricular diastolic function parameters were normal. - Atrial septum: No defect or patent foramen ovale was identified. Transthoracic echocardiography. M-mode, complete 2D, spectral Doppler, and color Doppler. Height: Height: 160cm. Height: 63in. Weight: Weight: 81.8kg. Weight: 180lb. Body mass index: BMI: 32kg/m^2. Body surface area: BSA: 1.52m^2. Blood pressure: 149/100. Patient status: Outpatient. Location: Echo laboratory. 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  CISH ratio 2.79, Ki-67 60%.  She then underwent a left lumpectomy with sentinel node dissection which revealed residual high-grade DCIS with negative margins and 4 sentinel nodes clear of metastatic disease. She completed 9 weekly doses of Taxol/Herceptin followed by radiation therapy with continued weekly Herceptin, she completed q. three-week maintenance Herceptin on 04/17/2011. Now on observation  alone.    Plan:  Tracy Barnes will return to see [place] on a 4 month interval, we will obtain a CBC, serum chemistry, LDH, and CA 27-29 the week prior to her visit. Hot flashes are a bit better with effexor and I have recommended increasing the peridin c .Pierce Crane,  MD

## 2012-03-23 ENCOUNTER — Ambulatory Visit: Payer: 59 | Admitting: *Deleted

## 2012-05-09 ENCOUNTER — Telehealth: Payer: Self-pay | Admitting: *Deleted

## 2012-05-09 ENCOUNTER — Other Ambulatory Visit: Payer: Self-pay | Admitting: Physician Assistant

## 2012-05-09 ENCOUNTER — Other Ambulatory Visit: Payer: Self-pay | Admitting: Oncology

## 2012-05-09 ENCOUNTER — Other Ambulatory Visit: Payer: Self-pay | Admitting: *Deleted

## 2012-05-09 DIAGNOSIS — C50919 Malignant neoplasm of unspecified site of unspecified female breast: Secondary | ICD-10-CM

## 2012-05-09 MED ORDER — ZOLPIDEM TARTRATE 5 MG PO TABS: 5.0000 mg | ORAL_TABLET | Freq: Every evening | ORAL | Status: DC | PRN

## 2012-05-09 MED ORDER — VENLAFAXINE HCL ER 75 MG PO CP24: 75.0000 mg | ORAL_CAPSULE | Freq: Every day | ORAL | Status: DC

## 2012-05-09 NOTE — Telephone Encounter (Signed)
Received refill requests from [facility] for Effexor, and Ambien.  Annice Pih, NP notified.  Called [facility] and spoke with Lupita Leash.   Informed Lupita Leash to contact pt's primary md for refill of Effexor and Ambien - as per Annice Pih, NP's instructions.

## 2012-07-09 ENCOUNTER — Encounter: Payer: Self-pay | Admitting: Oncology

## 2012-07-09 ENCOUNTER — Telehealth: Payer: Self-pay | Admitting: Family

## 2012-07-09 NOTE — Telephone Encounter (Signed)
, °

## 2012-08-26 ENCOUNTER — Telehealth: Payer: Self-pay | Admitting: *Deleted

## 2012-08-26 NOTE — Telephone Encounter (Signed)
Called and spoke with patient to reschedule her appt. Confirmed appt. For 10/10/12 at 1030 with Dr. Welton Flakes.

## 2012-09-16 ENCOUNTER — Telehealth: Payer: Self-pay | Admitting: Oncology

## 2012-09-20 ENCOUNTER — Other Ambulatory Visit: Payer: 59 | Admitting: Lab

## 2012-09-27 ENCOUNTER — Ambulatory Visit: Payer: 59 | Admitting: Oncology

## 2012-09-28 ENCOUNTER — Ambulatory Visit: Payer: 59 | Admitting: Family

## 2012-10-03 ENCOUNTER — Encounter: Payer: 59 | Attending: Family Medicine | Admitting: Dietician

## 2012-10-03 ENCOUNTER — Other Ambulatory Visit (HOSPITAL_BASED_OUTPATIENT_CLINIC_OR_DEPARTMENT_OTHER): Payer: 59 | Admitting: Lab

## 2012-10-03 ENCOUNTER — Encounter: Payer: Self-pay | Admitting: Dietician

## 2012-10-03 VITALS — Ht 63.75 in | Wt 201.0 lb

## 2012-10-03 DIAGNOSIS — R7309 Other abnormal glucose: Secondary | ICD-10-CM | POA: Insufficient documentation

## 2012-10-03 DIAGNOSIS — D0590 Unspecified type of carcinoma in situ of unspecified breast: Secondary | ICD-10-CM

## 2012-10-03 DIAGNOSIS — E669 Obesity, unspecified: Secondary | ICD-10-CM | POA: Insufficient documentation

## 2012-10-03 DIAGNOSIS — D0510 Intraductal carcinoma in situ of unspecified breast: Secondary | ICD-10-CM

## 2012-10-03 DIAGNOSIS — Z713 Dietary counseling and surveillance: Secondary | ICD-10-CM | POA: Insufficient documentation

## 2012-10-03 LAB — COMPREHENSIVE METABOLIC PANEL (CC13)
Albumin: 3.5 g/dL (ref 3.5–5.0)
BUN: 14.2 mg/dL (ref 7.0–26.0)
Calcium: 9 mg/dL (ref 8.4–10.4)
Chloride: 105 mEq/L (ref 98–107)
Creatinine: 0.7 mg/dL (ref 0.6–1.1)
Glucose: 120 mg/dl — ABNORMAL HIGH (ref 70–99)
Potassium: 4.1 mEq/L (ref 3.5–5.1)

## 2012-10-03 LAB — CBC WITH DIFFERENTIAL/PLATELET
Basophils Absolute: 0.1 10*3/uL (ref 0.0–0.1)
Eosinophils Absolute: 0.2 10*3/uL (ref 0.0–0.5)
HCT: 38.3 % (ref 34.8–46.6)
HGB: 13.2 g/dL (ref 11.6–15.9)
MCV: 75.5 fL — ABNORMAL LOW (ref 79.5–101.0)
MONO%: 7.9 % (ref 0.0–14.0)
NEUT#: 3.7 10*3/uL (ref 1.5–6.5)
NEUT%: 66.4 % (ref 38.4–76.8)
RDW: 14.6 % — ABNORMAL HIGH (ref 11.2–14.5)
lymph#: 1.2 10*3/uL (ref 0.9–3.3)

## 2012-10-03 NOTE — Progress Notes (Signed)
Medical Nutrition Therapy:  Appt start time: 1600 end time:  1430 and 1730.  Assessment:  Primary concerns today: obesity, Pre-DM.   MEDICATIONS: see list.   DIETARY INTAKE:  Usual eating pattern includes 3 meals and 2 snacks per day.  Everyday foods include fruit, eggs.  Avoided foods include broccoli, cauliflower, brussels sprout, yogurt.    24-hr recall:  B ( AM): two eggs scrambled with [place] sausage (two patties); coffee with creamer (1 TBSP)  Snk ( AM): 1 cup fruit; mix pineapple, grape, apple  L ( PM): sandwich (mustard, ham or [place] with lettuce, flatbread) or chix wrap with baked potato chips. water Snk ( PM): more fruit or protein/granola bar. Sometimes none D ( PM): fried or grilled pork chop or burger. Salmon patties; potatoes or corn for starch. Eats pasta 5 times a week, about 1 cup. Water. Sometimes sweet tea, has drastically reduced sodas.  Snk ( PM): glass of wine 1-2 times a week.  Beverages: coffee in AM, water, occasional sweet tea, rare soda, sweet wine 1 or 2 nights a week.   Usual physical activity: walks the stairs at work, walks down from top layer of parking deck, goes to park with grand kids, gardening, yard work. Some walking during work day (3 days per week at about 10 minutes each)  Progress Towards Goal(s):  In progress   Nutritional Diagnosis:  [place]-3.3 Overweight/obesity as related to high calorie food choices as evidenced by dietary recall and poor understanding of calorie content of foods.    Intervention:   Nutrition education regarding caloric content of foods, high protein foods, high fiber foods, limiting high fat and high sugar foods.   1300 calorie meal plan with carbohydrate control (1 CHO, 3-4 Pro, 1 fat Breakfast; 3 CHO, 3-4 Pro, 2 fat, 1 Veg lunch; 2 CHO, 3-4 Pro, 2 fat, 2 Veg dinner; 1 CHO, 1 fat, 1 Pro for each of 2 snacks)  Pt goal: additional 20 minutes of physical activity during the workday - ideally around meal times   Handouts  given during visit include:  High fiber, protein, sugar, fat food list   Carb control meal plan card   Monitoring/Evaluation:  Dietary intake, exercise, portion control, and body weight in 6 week(s).

## 2012-10-10 ENCOUNTER — Telehealth: Payer: Self-pay | Admitting: *Deleted

## 2012-10-10 ENCOUNTER — Ambulatory Visit (HOSPITAL_BASED_OUTPATIENT_CLINIC_OR_DEPARTMENT_OTHER): Payer: 59 | Admitting: Oncology

## 2012-10-10 VITALS — BP 158/87 | HR 80 | Temp 98.0°F | Resp 20 | Ht 63.75 in | Wt 198.5 lb

## 2012-10-10 DIAGNOSIS — C50919 Malignant neoplasm of unspecified site of unspecified female breast: Secondary | ICD-10-CM

## 2012-10-10 DIAGNOSIS — C50912 Malignant neoplasm of unspecified site of left female breast: Secondary | ICD-10-CM

## 2012-10-10 DIAGNOSIS — C792 Secondary malignant neoplasm of skin: Secondary | ICD-10-CM

## 2012-10-10 MED ORDER — ZOLPIDEM TARTRATE 5 MG PO TABS: 5.0000 mg | ORAL_TABLET | Freq: Every evening | ORAL | Status: DC | PRN

## 2012-10-10 MED ORDER — VENLAFAXINE HCL ER 75 MG PO CP24: 75.0000 mg | ORAL_CAPSULE | Freq: Every day | ORAL | Status: DC

## 2012-10-10 NOTE — Telephone Encounter (Signed)
appts made and printed. Pt want s to complete her labs a week before her visit...td

## 2012-10-10 NOTE — Patient Instructions (Addendum)
Doing well  Continue effexor for hot flashes  We discussed other ways of reducing hot flashes including exercise, healthy diet and acupuncture  I will see you back in 6 months  Breast Cancer Survivor Follow-Up Breast cancer begins when cells in the breast divide too rapidly. The extra cells form a lump (tumor). When the cancer is treated, the goal is to get rid of all cancer cells. However, sometimes a few cells survive. These cancer cells can then grow. They become recurrent cancer. This means the cancer comes back after treatment.  Most cases of recurrent breast cancer develop 3 to 5 years after treatment. However, sometimes it comes back just a few months after treatment. Other times, it does not come back until years later. If the cancer comes back in the same area as the first breast cancer, it is called a local recurrence. If the cancer comes back somewhere else in the body, it is called regional recurrence if the site is fairly near the breast or distant recurrence if it is far from the breast. Your caregiver may also use the term metastasize to indicate a cancer that has gone to another part of your body. Treatment is still possible after either kind of recurrence. The cancer can still be controlled.  CAUSES OF RECURRENT CANCER No one knows exactly why breast cancer starts in the first place. Why the cancer comes back after treatment is also not clear. It is known that certain conditions, called risk factors, can make this more likely. They include:  Developing breast cancer for the first time before age 70.  Having breast cancer that involves the lymph nodes. These are small, round pieces of tissue found all over the body. Their job is to help fight infections.  Having a large tumor. Cancer is more apt to come back if the first tumor was bigger than 2 inches (5 cm).  Having certain types of breast cancer, such as:  Inflammatory breast cancer. This rare type grows rapidly and causes the  breast to become red and swollen.  A high-grade tumor. The grade of a tumor indicates how fast it will grow and spread. High-grade tumors grow more quickly than other types.  HER2 cancer. This refers to the tumor's genetic makeup. Tumors that have this type of gene are more likely to come back after treatment.  Having close tumor margins. This refers to the space between the tumor and normal, noncancerous cells. If the space is small, the tumor has a greater chance of coming back.  Having treatment involving a surgery to remove the tumor but not the entire breast (lumpectomy) and no radiation therapy. CARE AFTER BREAST CANCER Home Monitoring Women who have had breast cancer should continue to examine their breasts every month. The goal is to catch the cancer quickly if it comes back. Many women find it helpful to do so on the same day each month and to mark the calendar as a reminder. Let your caregiver know immediately if you have any signs of recurrent breast cancer. Symptoms will vary, depending on where the cancer recurs. The original type of treatment can also make a difference. Symptoms of local recurrence after a lumpectomy or a recurrence in the opposite breast may include:  A new lump or thickening in the breast.  A change in the way the skin looks on the breast (such as a rash, dimpling, or wrinkling).  Redness or swelling of the breast.  Changes in the nipple (such as being red,  puckered, swollen, or leaking fluid). Symptoms of a recurrence after a breast removal surgery (mastectomy) may include:  A lump or thickening under the skin.  A thickening around the mastectomy scar. Symptoms of regional recurrence in the lymph nodes near the breast may include:  A lump under the arm or above the collarbone.  Swelling of the arm.  Pain in the arm, shoulder, or chest.  Numbness in the hand or arm. Symptoms of distant recurrence may include:  A cough that does not go  away.  Trouble breathing or shortness of breath.  Pain in the bones or the chest. This is pain that lasts or does not respond to rest and medicine.  Headaches.  Sudden vision problems.  Dizziness.  Nausea or vomiting.  Losing weight without trying to.  Persistent abdominal pain.  Changes in bowel movements or blood in the stool.  Yellowing of the skin or eyes (jaundice).  Blood in the urine or bloody vaginal discharge. Clinical Monitoring  It is helpful to keep a schedule of appointments for needed tests and exams. This includes physical exams, breast exams, exams of the lymph nodes, and general exams.  For the first 3 years after being treated for breast cancer, see your caregiver every 3 to 6 months.  For years 4 and 5 after breast cancer, see your caregiver every 6 to 12 months.  After 5 years, see your caregiver at least once a year.  Regular breast X-rays (mammograms) should continue even if you had a mastectomy.  A mammogram should be done 1 year after the mammogram that first detected breast cancer.  A mammogram should be done every 6 to 12 months after that. Follow your caregiver's advice.  A pelvic exam done by your caregiver checks whether female organs are the normal size and shape. The exam is usually done every year. Ask your caregiver if that schedule is right for you.  Women taking tamoxifen should report any vaginal bleeding immediately to their caregiver. Tamoxifen is often given to women with a certain type of breast cancer. It has been shown to help prevent recurrence.  You will need to decide who your primary caregiver will be.  Most people continue to see their cancer specialist (oncologist) every 3 to 6 months for the first year after cancer treatment.  At some point, you may want to go back to seeing your family caregiver. You would no longer see your oncologist for regular checkups. Many women do this about 1 year after their first diagnosis of  breast cancer.  You will still need to be seen every so often by your oncologist. Ask how often that should be. Coordinate this with your family or primary caregiver.  Think about having genetic counseling. This would provide information on traits that can be passed or inherited from one generation to the next. In some cases, breast cancer runs in families. Tell your caregiver if you:  Are of Ashkenazi Jewish heritage.  Have any family member who has had ovarian cancer.  Have a mother, sister, or daughter who had breast cancer before age 44.  Have 2 or more close relatives who have had breast cancer. This means a mother, sister, daughter, aunt, or grandmother.  Had breast cancer in both breasts.  Have a female relative who has had breast cancer.  Some tests are not recommended for routine screening. Someone recovering from breast cancer does not need to have these tests if there are no problems. The tests have risks, such  as radiation exposure, and can be costly. The risks of these tests are thought to be greater than the benefits:  Blood tests.  Chest X-rays.  Bone scans.  Liver ultrasound.  Computed tomography (CT scan).  Positron emission tomography (PET scan).  Magnetic resonance imaging (MRI scan). DIAGNOSIS OF RECURRENT CANCER Recurrent breast cancer may be suspected for various reasons. A mammogram may not look normal. You might feel a lump or have other symptoms. Your caregiver may find something unusual during an exam. To be sure, your caregiver will probably order some tests. The tests are needed because there are symptoms or hints of a problem. They could include:  Blood tests, including a test to check how well the liver is working. The liver is a common site for a distant cancer recurrence.  Imaging tests that create pictures of the inside of the body. These tests include:  Chest X-rays to show if the cancer has come back in the lungs.  CT scans to create detailed  pictures of various areas of the body and help find a distant recurrence.  MRI scans to find anything unusual in the breast, chest, or lymph nodes.  Breast ultrasound tests to examine the breasts.  Bone scans to create a picture of your whole skeleton and find cancer in bony areas.  PET scans to create an image of the whole body. PET scans can be used together with CT scans to show more detail.  Biopsy. A small sample of tissue is taken and checked under a microscope. If cancer cells are found, they may be tested to see if they contain the HER2 gene or the hormones estrogen and progesterone. This will help your caregiver decide how to treat the recurrent cancer. TREATMENT  How recurrent breast cancer is treated depends on where the new cancer is found. The type of treatment that was used for the first breast cancer makes a difference, too. A combination of treatments may be used. Options include:  Surgery.  If the cancer comes back in the breast that was not treated before, you may need a lumpectomy or mastectomy.  If the cancer comes back in the breast that was treated before, you may need a mastectomy.  The lymph nodes under the arm may need to be removed.  Radiation therapy.  For a local recurrence, radiation may be used if it was not used during the first treatment.  For a distance recurrence, radiation is sometimes used.  Chemotherapy.  This may be used before surgery to treat recurrent breast cancer.  This may be used to treat recurrent cancer that cannot be treated with surgery.  This may be used to treat a distant recurrence.  Hormone therapy.  Women with the HER2 gene may be given hormone therapy to attack this gene. Document Released: 12/03/2010 Document Revised: 06/29/2011 Document Reviewed: 12/03/2010 [facility] Patient Information 2014 [place], [place].

## 2012-10-10 NOTE — Progress Notes (Signed)
OFFICE PROGRESS NOTE  CC  MALONEY,NANCY, MD [address] [place] [address] Dr. Lurline Hare Dr. 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  PRIOR THERAPY:  #1 patient underwent a screening mammogram in October 2011 which showed calcifications. She subsequently had an ultrasound that showed 2 groups of suspicious microcalcifications in the left lower breast. 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 The left core biopsy of the area more posterior and lateral showed a high-grade DCIS with necrosis and calcifications. The invasive component was ER negative PR negative with a Ki-67 of 60% and HER-2/neu was amplified with a ratio of 2.79. Patient had MRI of the breasts performed that only showed post biopsy changes.  #2 patient underwent a lumpectomy with sentinel lymph node biopsy. The final pathology revealed residual high-grade DCIS with negative margins and 4 sentinel nodes were negative for metastatic disease.  #3 patient subsequently received adjuvant chemotherapy initially consisting of 9 weeks of Taxol and Herceptin. She then received weekly Herceptin with radiation. Once patient completed the radiation she received Herceptin every 3 weeks as maintenance which he finished on 04/17/2011.  CURRENT THERAPY: Observation  INTERVAL HISTORY: Tracy Barnes 57 y.o. female returns for followup visit today. Overall she's doing well. Her primary concerns are hot flashes fatigue and insomnia. She does do a lot of tossing and turning. For her hot flashes she is on Effexor 75 mg daily but that still is not controlling the symptoms. She also takes Ambien for sleeping at nighttime and occasionally. She otherwise denies any nausea vomiting fevers chills , headaches. She  has no abdominal pain she has not noticed any masses in her breasts. Recently she had mammogram performed that did show some abnormalities she underwent biopsies in that was negative. She does complain of having peripheral neuropathy off-and-on. This is bilaterally in the upper extremities. This has been happening for a period of 5-6 months. She has not had any hematuria hematochezia. She is trying to exercise she walks daily. Remainder of the 10 point review of systems is negative.  MEDICAL HISTORY: Past Medical History  Diagnosis Date  . Cancer 02/19/2010    left breast  . Hx of radiation therapy 06/18/10 to 08/01/10    L breast  . Diabetes mellitus   . Hypertension   . Hyperlipidemia     ALLERGIES:  is allergic to codeine.  MEDICATIONS:  Current Outpatient Prescriptions  Medication Sig Dispense Refill  . ALPRAZolam (XANAX) 0.25 MG tablet Take 0.25 mg by mouth at bedtime as needed.        . venlafaxine XR (EFFEXOR-XR) 75 MG 24 hr capsule Take 1 capsule (75 mg total) by mouth daily.  90 capsule  3  . zolpidem (AMBIEN) 5 MG tablet Take 1 tablet (5 mg total) by mouth at bedtime as needed for sleep. FUTURE REFILLS PER PCP  30 tablet  0   No current facility-administered medications for this visit.    SURGICAL HISTORY:  Past Surgical History  Procedure Laterality Date  . Abdominal hysterectomy      At 32 yrs of age. One ovary removed. Surgery due to fibroids per medical history form dated 02/25/10.  . Breast surgery  03/25/2010    left lumpectomy  . Deep axillary sentinel node biopsy / excision  03/25/2010    4/4 nodes negative  .  Portacath placement  04/18/2010    Dr. Claud Kelp  . Port-a-cath removal  06/05/2011    Procedure: MINOR REMOVAL PORT-A-CATH;  Surgeon: Ernestene Mention, MD;  Location: [facility];  Service: General;  Laterality: N/A;  right    REVIEW OF SYSTEMS:  Pertinent items are noted in HPI.   HEALTH MAINTENANCE:  M  PHYSICAL  EXAMINATION: Blood pressure 158/87, pulse 80, temperature 98 F (36.7 C), temperature source Oral, resp. rate 20, height 5' 3.75" (1.619 m), weight 198 lb 8 oz (90.039 kg). Body mass index is 34.35 kg/(m^2). ECOG PERFORMANCE STATUS: 0 - Asymptomatic   General appearance: alert, cooperative and appears stated age Lymph nodes: Cervical, supraclavicular, and axillary nodes normal. Cardio: regular rate and rhythm GI: soft, non-tender; bowel sounds normal; no masses,  no organomegaly Extremities: extremities normal, atraumatic, no cyanosis or edema Neurologic: Grossly normal Right breast: No masses nipple discharge or skin changes. Left breast reveals well-healed surgical scar. It is smaller than the right breast. There is no nodularity or dominant masses or nipple discharge.  LABORATORY DATA: Lab Results  Component Value Date   WBC 5.6 10/03/2012   HGB 13.2 10/03/2012   HCT 38.3 10/03/2012   MCV 75.5* 10/03/2012   PLT 236 10/03/2012      Chemistry      Component Value Date/Time   NA 138 10/03/2012 0854   NA 138 10/19/2011 1446   K 4.1 10/03/2012 0854   K 4.5 10/19/2011 1446   CL 105 10/03/2012 0854   CL 103 10/19/2011 1446   CO2 25 10/03/2012 0854   CO2 24 10/19/2011 1446   BUN 14.2 10/03/2012 0854   BUN 18 10/19/2011 1446   CREATININE 0.7 10/03/2012 0854   CREATININE 0.76 10/19/2011 1446      Component Value Date/Time   CALCIUM 9.0 10/03/2012 0854   CALCIUM 9.1 10/19/2011 1446   ALKPHOS 67 10/03/2012 0854   ALKPHOS 85 10/19/2011 1446   AST 14 10/03/2012 0854   AST 20 10/19/2011 1446   ALT 19 10/03/2012 0854   ALT 18 10/19/2011 1446   BILITOT 0.35 10/03/2012 0854   BILITOT 0.2* 10/19/2011 1446       RADIOGRAPHIC STUDIES:  No results found.  ASSESSMENT: 57 year old female with  #1 T1 N0 invasive ductal carcinoma of the left breast with high-grade ductal carcinoma in situ. Patient is status post left breast lumpectomy with sentinel lymph node biopsies. Her final pathology reveals micro-invasion  infiltrating ductal carcinoma that was ER negative PR negative HER-2/neu positive with a Ki-67 of 60%. HER-2 was amplified with a ratio of 2.79. Patient received adjuvant chemotherapy and Herceptin. Chemotherapy consisted of weekly Taxol with Herceptin for 9 weeks followed by radiation and weekly Herceptin. When she completed this that she went on to receive maintenance Herceptin every 3 weeks completing on 04/17/2011. She has no evidence of recurrent disease.  #2 hot flashes on Effexor.  #3 survivorship issues.   PLAN:   #1 we discussed survivorship issues and she is given information on this.  #2 for hot flashes I have recommended she continue Effexor but she will also need to exercise we also recommended yoga and acupuncture.  #3  insomnia prescription for Ambien was given to the patient.  #4 patient will be seen back in 6 months time or sooner.   All questions were answered. The patient knows to call the clinic with any problems, questions or concerns. We can certainly see the patient much sooner if necessary.  I spent 30 minutes counseling the patient face to face. The total time spent in the appointment was 30 minutes.    Drue Second, MD Medical/Oncology [facility] [phone] (beeper) [phone] (Office)  10/10/2012, 11:17 AM

## 2012-11-11 ENCOUNTER — Other Ambulatory Visit: Payer: Self-pay | Admitting: Medical Oncology

## 2012-11-11 NOTE — Telephone Encounter (Signed)
Ambien refills now go to PCP. Faxed back to [facility] out pt pharmacy

## 2012-11-14 ENCOUNTER — Encounter: Payer: 59 | Admitting: Dietician

## 2013-01-10 ENCOUNTER — Other Ambulatory Visit: Payer: Self-pay | Admitting: Oncology

## 2013-01-10 DIAGNOSIS — M7989 Other specified soft tissue disorders: Secondary | ICD-10-CM

## 2013-02-09 ENCOUNTER — Ambulatory Visit
Admission: RE | Admit: 2013-02-09 | Discharge: 2013-02-09 | Disposition: A | Discharge status: Home / Self Care | Payer: 59 | Source: Ambulatory Visit | Attending: Oncology | Admitting: Oncology

## 2013-02-09 DIAGNOSIS — M7989 Other specified soft tissue disorders: Secondary | ICD-10-CM

## 2013-02-23 ENCOUNTER — Other Ambulatory Visit: Payer: Self-pay

## 2013-03-02 ENCOUNTER — Other Ambulatory Visit: Payer: Self-pay | Admitting: Emergency Medicine

## 2013-03-02 DIAGNOSIS — C50912 Malignant neoplasm of unspecified site of left female breast: Secondary | ICD-10-CM

## 2013-03-02 MED ORDER — ZOLPIDEM TARTRATE 5 MG PO TABS: 5.0000 mg | ORAL_TABLET | Freq: Every evening | ORAL | Status: DC | PRN

## 2013-03-10 ENCOUNTER — Telehealth: Payer: Self-pay | Admitting: Oncology

## 2013-03-10 NOTE — Telephone Encounter (Signed)
, °

## 2013-04-10 ENCOUNTER — Other Ambulatory Visit: Payer: 59

## 2013-04-17 ENCOUNTER — Ambulatory Visit: Payer: 59 | Admitting: Oncology

## 2013-04-24 ENCOUNTER — Other Ambulatory Visit (HOSPITAL_BASED_OUTPATIENT_CLINIC_OR_DEPARTMENT_OTHER): Payer: 59

## 2013-04-24 DIAGNOSIS — C50319 Malignant neoplasm of lower-inner quadrant of unspecified female breast: Secondary | ICD-10-CM

## 2013-04-24 DIAGNOSIS — C50912 Malignant neoplasm of unspecified site of left female breast: Secondary | ICD-10-CM

## 2013-04-24 LAB — COMPREHENSIVE METABOLIC PANEL (CC13)
ALK PHOS: 69 U/L (ref 40–150)
ALT: 20 U/L (ref 0–55)
ANION GAP: 11 meq/L (ref 3–11)
AST: 14 U/L (ref 5–34)
Albumin: 3.7 g/dL (ref 3.5–5.0)
BILIRUBIN TOTAL: 0.5 mg/dL (ref 0.20–1.20)
BUN: 12.6 mg/dL (ref 7.0–26.0)
CO2: 24 mEq/L (ref 22–29)
CREATININE: 0.8 mg/dL (ref 0.6–1.1)
Calcium: 9.3 mg/dL (ref 8.4–10.4)
Chloride: 103 mEq/L (ref 98–109)
Glucose: 156 mg/dl — ABNORMAL HIGH (ref 70–140)
Potassium: 4.2 mEq/L (ref 3.5–5.1)
SODIUM: 138 meq/L (ref 136–145)
TOTAL PROTEIN: 6.7 g/dL (ref 6.4–8.3)

## 2013-04-24 LAB — CBC WITH DIFFERENTIAL/PLATELET
BASO%: 0.8 % (ref 0.0–2.0)
Basophils Absolute: 0.1 10*3/uL (ref 0.0–0.1)
EOS%: 2.6 % (ref 0.0–7.0)
Eosinophils Absolute: 0.2 10*3/uL (ref 0.0–0.5)
HEMATOCRIT: 41.9 % (ref 34.8–46.6)
HGB: 13.9 g/dL (ref 11.6–15.9)
LYMPH%: 16 % (ref 14.0–49.7)
MCH: 24.8 pg — AB (ref 25.1–34.0)
MCHC: 33.3 g/dL (ref 31.5–36.0)
MCV: 74.6 fL — AB (ref 79.5–101.0)
MONO#: 0.4 10*3/uL (ref 0.1–0.9)
MONO%: 5.4 % (ref 0.0–14.0)
NEUT#: 5 10*3/uL (ref 1.5–6.5)
NEUT%: 75.2 % (ref 38.4–76.8)
PLATELETS: 246 10*3/uL (ref 145–400)
RBC: 5.61 10*6/uL — AB (ref 3.70–5.45)
RDW: 16.1 % — ABNORMAL HIGH (ref 11.2–14.5)
WBC: 6.6 10*3/uL (ref 3.9–10.3)
lymph#: 1.1 10*3/uL (ref 0.9–3.3)

## 2013-05-01 ENCOUNTER — Ambulatory Visit (HOSPITAL_BASED_OUTPATIENT_CLINIC_OR_DEPARTMENT_OTHER): Payer: 59 | Admitting: Oncology

## 2013-05-01 ENCOUNTER — Encounter: Payer: Self-pay | Admitting: Oncology

## 2013-05-01 ENCOUNTER — Telehealth: Payer: Self-pay | Admitting: Oncology

## 2013-05-01 VITALS — BP 126/89 | HR 91 | Temp 99.0°F | Resp 20 | Ht 63.75 in | Wt 201.3 lb

## 2013-05-01 DIAGNOSIS — D0512 Intraductal carcinoma in situ of left breast: Secondary | ICD-10-CM

## 2013-05-01 DIAGNOSIS — G4700 Insomnia, unspecified: Secondary | ICD-10-CM

## 2013-05-01 DIAGNOSIS — C50912 Malignant neoplasm of unspecified site of left female breast: Secondary | ICD-10-CM

## 2013-05-01 DIAGNOSIS — Z171 Estrogen receptor negative status [ER-]: Secondary | ICD-10-CM

## 2013-05-01 DIAGNOSIS — C50319 Malignant neoplasm of lower-inner quadrant of unspecified female breast: Secondary | ICD-10-CM

## 2013-05-01 DIAGNOSIS — N959 Unspecified menopausal and perimenopausal disorder: Secondary | ICD-10-CM

## 2013-05-01 DIAGNOSIS — C801 Malignant (primary) neoplasm, unspecified: Secondary | ICD-10-CM

## 2013-05-01 NOTE — Progress Notes (Signed)
OFFICE PROGRESS NOTE  CC  MALONEY,NANCY, MD [address] [place] [address] Dr. Thea Silversmith Dr. 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  PRIOR THERAPY:  #1 patient underwent a screening mammogram in October 2011 which showed calcifications. She subsequently had an ultrasound that showed 2 groups of suspicious microcalcifications in the left lower breast. 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 The left core biopsy of the area more posterior and lateral showed a high-grade DCIS with necrosis and calcifications. The invasive component was ER negative PR negative with a Ki-67 of 60% and HER-2/neu was amplified with a ratio of 2.79. Patient had MRI of the breasts performed that only showed post biopsy changes.  #2 patient underwent a lumpectomy with sentinel lymph node biopsy. The final pathology revealed residual high-grade DCIS with negative margins and 4 sentinel nodes were negative for metastatic disease.  #3 patient subsequently received adjuvant chemotherapy initially consisting of 9 weeks of Taxol and Herceptin. She then received weekly Herceptin with radiation. Once patient completed the radiation she received Herceptin every 3 weeks as maintenance which he finished on 04/17/2011.  CURRENT THERAPY: Observation  INTERVAL HISTORY: Tracy Barnes 58 y.o. female returns for followup visit today. Overall she's doing well. Her primary concerns are hot flashes fatigue and insomnia. . She takes Ambien for sleeping at nighttime and occasionally. She otherwise denies any nausea vomiting fevers chills , headaches. She has no abdominal pain she has not noticed any masses in her breasts. Recently she had mammogram performed that did show some abnormalities she  underwent biopsies in that was negative. She does complain of having peripheral neuropathy off-and-on. This is bilaterally in the upper extremities. This has been happening for a period of 5-6 months. She has not had any hematuria hematochezia. She is trying to exercise she walks daily. Remainder of the 10 point review of systems is negative.  MEDICAL HISTORY: Past Medical History  Diagnosis Date  . Cancer 02/19/2010    left breast  . Hx of radiation therapy 06/18/10 to 08/01/10    L breast  . Diabetes mellitus   . Hypertension   . Hyperlipidemia     ALLERGIES:  is allergic to codeine.  MEDICATIONS:  Current Outpatient Prescriptions  Medication Sig Dispense Refill  . ALPRAZolam (XANAX) 0.25 MG tablet Take 0.25 mg by mouth at bedtime as needed.        . fluticasone (FLONASE) 50 MCG/ACT nasal spray Place 2 sprays into both nostrils daily.      . hydrochlorothiazide (HYDRODIURIL) 25 MG tablet Take 25 mg by mouth daily.      Marland Kitchen venlafaxine XR (EFFEXOR-XR) 75 MG 24 hr capsule Take 1 capsule (75 mg total) by mouth daily.  90 capsule  6  . zolpidem (AMBIEN) 5 MG tablet Take 1 tablet (5 mg total) by mouth at bedtime as needed for sleep.  30 tablet  3   No current facility-administered medications for this visit.    SURGICAL HISTORY:  Past Surgical History  Procedure Laterality Date  . Abdominal hysterectomy      At 69 yrs of age. One ovary removed. Surgery due to fibroids per medical history form dated 02/25/10.  . Breast surgery  03/25/2010    left lumpectomy  . Deep axillary sentinel node biopsy / excision  03/25/2010  4/4 nodes negative  . Portacath placement  04/18/2010    Dr. Fanny Skates  . Port-a-cath removal  06/05/2011    Procedure: MINOR REMOVAL PORT-A-CATH;  Surgeon: Adin Hector, MD;  Location: [facility];  Service: General;  Laterality: N/A;  right    REVIEW OF SYSTEMS:  Pertinent items are noted in HPI.   PHYSICAL EXAMINATION: Blood pressure  126/89, pulse 91, temperature 99 F (37.2 C), temperature source Oral, resp. rate 20, height 5' 3.75" (1.619 m), weight 201 lb 4.8 oz (91.309 kg). Body mass index is 34.84 kg/(m^2). ECOG PERFORMANCE STATUS: 0 - Asymptomatic   General appearance: alert, cooperative and appears stated age Lymph nodes: Cervical, supraclavicular, and axillary nodes normal. Cardio: regular rate and rhythm GI: soft, non-tender; bowel sounds normal; no masses,  no organomegaly Extremities: extremities normal, atraumatic, no cyanosis or edema Neurologic: Grossly normal Right breast: No masses nipple discharge or skin changes. Left breast reveals well-healed surgical scar. It is smaller than the right breast. There is no nodularity or dominant masses or nipple discharge.  LABORATORY DATA: Lab Results  Component Value Date   WBC 6.6 04/24/2013   HGB 13.9 04/24/2013   HCT 41.9 04/24/2013   MCV 74.6* 04/24/2013   PLT 246 04/24/2013      Chemistry      Component Value Date/Time   NA 138 04/24/2013 0856   NA 138 10/19/2011 1446   K 4.2 04/24/2013 0856   K 4.5 10/19/2011 1446   CL 105 10/03/2012 0854   CL 103 10/19/2011 1446   CO2 24 04/24/2013 0856   CO2 24 10/19/2011 1446   BUN 12.6 04/24/2013 0856   BUN 18 10/19/2011 1446   CREATININE 0.8 04/24/2013 0856   CREATININE 0.76 10/19/2011 1446      Component Value Date/Time   CALCIUM 9.3 04/24/2013 0856   CALCIUM 9.1 10/19/2011 1446   ALKPHOS 69 04/24/2013 0856   ALKPHOS 85 10/19/2011 1446   AST 14 04/24/2013 0856   AST 20 10/19/2011 1446   ALT 20 04/24/2013 0856   ALT 18 10/19/2011 1446   BILITOT 0.50 04/24/2013 0856   BILITOT 0.2* 10/19/2011 1446       RADIOGRAPHIC STUDIES:  No results found.  ASSESSMENT: 58 year old female with  #1 T1 N0 invasive ductal carcinoma of the left breast with high-grade invasive ductal carcinoma Patient is status post left breast lumpectomy with sentinel lymph node biopsies. Her final pathology reveals micro-invasion infiltrating ductal carcinoma that was ER  negative PR negative HER-2/neu positive with a Ki-67 of 60%. HER-2 was amplified with a ratio of 2.79. Patient received adjuvant chemotherapy and Herceptin. Chemotherapy consisted of weekly Taxol with Herceptin for 9 weeks followed by radiation and weekly Herceptin. When she completed this that she went on to receive maintenance Herceptin every 3 weeks completing on 04/17/2011. She has no evidence of recurrent disease.  #2 patient continues to do well she is on observation only no evidence of recurrent disease.  #3 hot flashes: She is on Effexor 75 mg daily tolerating it well with some reduction in the intensity of the hot flashes.  PLAN:  #1 continue Effexor 75 mg daily.  #2 she is also on Ambien for insomnia.  #3 patient will continue to be seen every 6 months.   All questions were answered. The patient knows to call the clinic with any problems, questions or concerns. We can certainly see the patient much sooner if necessary.  I spent 20 minutes counseling the patient  face to face. The total time spent in the appointment was 30 minutes.    Marcy Panning, MD Medical/Oncology [facility] [phone] (beeper) [phone] (Office)  05/01/2013, 11:39 AM

## 2013-05-01 NOTE — Telephone Encounter (Signed)
, °

## 2013-07-21 ENCOUNTER — Other Ambulatory Visit: Payer: Self-pay | Admitting: Oncology

## 2013-08-21 ENCOUNTER — Other Ambulatory Visit: Payer: Self-pay | Admitting: Oncology

## 2013-08-22 ENCOUNTER — Telehealth: Payer: Self-pay

## 2013-08-22 NOTE — Telephone Encounter (Signed)
Pt LM requesting Ambien refill.  Per Order by Aleene Davidson 09/2012, pt was to have future refills managed by her PCP.  Discussed with pt - she states she did not know that but would see if she could get an appt with PCP for her Ambien refill.

## 2013-08-31 ENCOUNTER — Other Ambulatory Visit: Payer: Self-pay | Admitting: Oncology

## 2013-09-01 ENCOUNTER — Ambulatory Visit (INDEPENDENT_AMBULATORY_CARE_PROVIDER_SITE_OTHER): Payer: 59 | Admitting: Cardiovascular Disease

## 2013-09-01 ENCOUNTER — Encounter: Payer: Self-pay | Admitting: Cardiovascular Disease

## 2013-09-01 ENCOUNTER — Telehealth: Payer: Self-pay | Admitting: *Deleted

## 2013-09-01 VITALS — BP 132/82 | HR 80 | Ht 63.0 in | Wt 194.9 lb

## 2013-09-01 DIAGNOSIS — R0609 Other forms of dyspnea: Secondary | ICD-10-CM

## 2013-09-01 DIAGNOSIS — R079 Chest pain, unspecified: Secondary | ICD-10-CM

## 2013-09-01 DIAGNOSIS — R0989 Other specified symptoms and signs involving the circulatory and respiratory systems: Secondary | ICD-10-CM

## 2013-09-01 DIAGNOSIS — R0600 Dyspnea, unspecified: Secondary | ICD-10-CM | POA: Insufficient documentation

## 2013-09-01 DIAGNOSIS — R0602 Shortness of breath: Secondary | ICD-10-CM

## 2013-09-01 NOTE — Patient Instructions (Signed)
  We will see you back in follow up after the test.  Dr Gwenlyn Found has ordered : 1.  Echocardiogram. Echocardiography is a painless test that uses sound waves to create images of your heart. It provides your doctor with information about the size and shape of your heart and how well your heart's chambers and valves are working. This procedure takes approximately one hour. There are no restrictions for this procedure.   2. Lexiscan Myoview- this is a test that looks at the blood flow to your heart muscle.  It takes approximately 2 1/2 hours. Please follow instruction sheet, as given.

## 2013-09-01 NOTE — Progress Notes (Signed)
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 She was referred by Dr. Tamala Barnes for evaluation of progressive dyspnea on exertion and chest pain. Her cardiac risk factor profile is notable for diabetes, hypertension and hyperlipidemia. There is no family history. She does not smoke. She has had breast cancer and lumpectomy with radiation and chemotherapy currently treated by Dr. Chancy Barnes. She relates new onset dyspnea and chest pain after an upper respiratory tract infection in October last year. The chest pain radiates to her jaws bilaterally as well.   Current Outpatient Prescriptions  Medication Sig Dispense Refill  . ALPRAZolam (XANAX) 0.25 MG tablet Take 0.25 mg by mouth at bedtime as needed.        . desvenlafaxine (PRISTIQ) 50 MG 24 hr tablet Take 50 mg by mouth daily.      . fluticasone (FLONASE) 50 MCG/ACT nasal spray Place 2 sprays into both nostrils daily.      . hydrochlorothiazide (HYDRODIURIL) 12.5 MG tablet Take 12.5 mg by mouth daily.      Marland Kitchen levothyroxine (SYNTHROID, LEVOTHROID) 100 MCG tablet Take 100 mcg by mouth daily before breakfast.      . metFORMIN (GLUCOPHAGE) 500 MG tablet Take 500 mg by mouth 2 (two) times daily with a meal.      . pantoprazole (PROTONIX) 40 MG tablet Take 40 mg by mouth 2 (two) times daily.      Marland Kitchen zolpidem (AMBIEN) 5 MG tablet TAKE 1 TABLET BY MOUTH AT BEDTIME AS NEEDED FOR SLEEP  30 tablet  0   No current facility-administered medications for this visit.    Allergies  Allergen Reactions  . Codeine Itching    Updated per [place].    History   Social History  . Marital Status: Widowed    Spouse Name: N/A    Number of  Children: N/A  . Years of Education: N/A   Occupational History  . Not on file.   Social History Main Topics  . Smoking status: Never Smoker   . Smokeless tobacco: Not on file  . Alcohol Use: No  . Drug Use: No  . Sexual Activity: Yes   Other Topics Concern  . Not on file   Social History Narrative  . No narrative on file     Review of Systems: General: negative for chills, fever, night sweats or weight changes.  Cardiovascular: negative for chest pain, dyspnea on exertion, edema, orthopnea, palpitations, paroxysmal nocturnal dyspnea or shortness of breath Dermatological: negative for rash Respiratory: negative for cough or wheezing Urologic: negative for hematuria Abdominal: negative for nausea, vomiting, diarrhea, bright red blood per rectum, melena, or hematemesis Neurologic: negative for visual changes, syncope, or dizziness All other systems reviewed and are otherwise negative except as noted above.    Blood pressure 132/82, pulse 80, height 5\' 3"  (1.6 m), weight 194 lb 14.4 oz (88.406 kg).  General appearance: alert and no distress Neck: no adenopathy, no carotid bruit, no JVD, supple, symmetrical, trachea midline and thyroid not enlarged, symmetric, no tenderness/mass/nodules Lungs: clear to auscultation bilaterally Heart: regular rate and rhythm, S1, S2 normal, no murmur, click, rub or gallop Extremities: extremities normal, atraumatic, no cyanosis or edema and 2+ pedal pulses bilaterally  EKG normal sinus rhythm at 74 with nonspecific ST and T-wave changes. This was performed by her primary care physician.  ASSESSMENT AND PLAN:   Dyspnea on exertion The patient complains of progressive dyspnea on exertion since October of last year after A. Upper respiratory tract infection in October of last year. It has gotten progressively worse since that time. I am going to get a 2-D echocardiogram to evaluate her LV function.  Chest pain The patient has described  new-onset chest pressure radiating to her jaws bilaterally with exertion over the same time. She's noticed increasing dyspnea on exertion. I'm going to get an exercise Myoview stress test to rule out an ischemic etiology. Factors include diabetes, hypertension and hyperlipidemia.      Tracy Harp MD FACP,FACC,FAHA, [facility] 09/01/2013 11:57 AM

## 2013-09-01 NOTE — Telephone Encounter (Signed)
Called to ask for name of two drugs she received for her breast cancer. Thinks her PCP is saying the chemo caused her hypertension and they are wanting to do heart tests. Informed her she had Taxol and Herceptin. Herceptin can cause heart damage and cardiac workup is reasonable. She appreciates information.

## 2013-09-01 NOTE — Assessment & Plan Note (Signed)
The patient has described new-onset chest pressure radiating to her jaws bilaterally with exertion over the same time. She's noticed increasing dyspnea on exertion. I'm going to get an exercise Myoview stress test to rule out an ischemic etiology. Factors include diabetes, hypertension and hyperlipidemia.

## 2013-09-01 NOTE — Assessment & Plan Note (Signed)
The patient complains of progressive dyspnea on exertion since October of last year after A. Upper respiratory tract infection in October of last year. It has gotten progressively worse since that time. I am going to get a 2-D echocardiogram to evaluate her LV function.

## 2013-09-06 ENCOUNTER — Ambulatory Visit: Payer: 59 | Admitting: Cardiovascular Disease

## 2013-09-12 ENCOUNTER — Telehealth (HOSPITAL_COMMUNITY): Payer: Self-pay

## 2013-09-14 ENCOUNTER — Encounter (HOSPITAL_COMMUNITY): Payer: 59

## 2013-09-14 ENCOUNTER — Ambulatory Visit (HOSPITAL_COMMUNITY): Payer: 59

## 2013-09-15 ENCOUNTER — Emergency Department (HOSPITAL_COMMUNITY)
Admission: EM | Admit: 2013-09-15 | Discharge: 2013-09-15 | Disposition: A | Discharge status: Home / Self Care | Payer: 59 | Source: Home / Self Care

## 2013-09-15 ENCOUNTER — Encounter (HOSPITAL_COMMUNITY): Payer: Self-pay | Admitting: Emergency Medicine

## 2013-09-15 DIAGNOSIS — N318 Other neuromuscular dysfunction of bladder: Secondary | ICD-10-CM

## 2013-09-15 DIAGNOSIS — N3281 Overactive bladder: Secondary | ICD-10-CM

## 2013-09-15 LAB — POCT URINALYSIS DIP (DEVICE)
Bilirubin Urine: NEGATIVE
GLUCOSE, UA: NEGATIVE mg/dL
HGB URINE DIPSTICK: NEGATIVE
KETONES UR: NEGATIVE mg/dL
Leukocytes, UA: NEGATIVE
Nitrite: NEGATIVE
PROTEIN: NEGATIVE mg/dL
UROBILINOGEN UA: 1 mg/dL (ref 0.0–1.0)
pH: 6 (ref 5.0–8.0)

## 2013-09-15 MED ORDER — FESOTERODINE FUMARATE ER 4 MG PO TB24: 4.0000 mg | ORAL_TABLET | Freq: Every day | ORAL | Status: DC

## 2013-09-15 NOTE — ED Notes (Signed)
Pt  reports  Frequency  Of  Urination       Low  abd  Pain     Burns  When  Urination      Symptoms  X  5  Days

## 2013-09-15 NOTE — ED Provider Notes (Signed)
CSN: [id number]     Arrival date & time 09/15/13  1037 History   None    Chief Complaint  Patient presents with  . Urinary Frequency   (Consider location/radiation/quality/duration/timing/severity/associated sxs/prior Treatment) Patient is a 58 y.o. female presenting with frequency. The history is provided by the patient.  Urinary Frequency This is a new problem. The current episode started more than 2 days ago. The problem has been gradually worsening. Pertinent negatives include no abdominal pain.    Past Medical History  Diagnosis Date  . Cancer 02/19/2010    left breast  . Hx of radiation therapy 06/18/10 to 08/01/10    L breast  . Diabetes mellitus   . Hypertension   . Hyperlipidemia   . Dyspnea on exertion   . Chest pain    Past Surgical History  Procedure Laterality Date  . Abdominal hysterectomy      At 45 yrs of age. One ovary removed. Surgery due to fibroids per medical history form dated 02/25/10.  . Breast surgery  03/25/2010    left lumpectomy  . Deep axillary sentinel node biopsy / excision  03/25/2010    4/4 nodes negative  . Portacath placement  04/18/2010    Dr. Fanny Skates  . Port-a-cath removal  06/05/2011    Procedure: MINOR REMOVAL PORT-A-CATH;  Surgeon: Adin Hector, MD;  Location: [facility];  Service: General;  Laterality: N/A;  right   Family History  Problem Relation Age of Onset  . Cancer Mother     Cancer of bladder and brain per medical history form dated 02/25/10.   History  Substance Use Topics  . Smoking status: Never Smoker   . Smokeless tobacco: Not on file  . Alcohol Use: No   OB History   Grav Para Term Preterm Abortions TAB SAB Ect Mult Living                 Review of Systems  Gastrointestinal: Negative.  Negative for abdominal pain.  Genitourinary: Positive for urgency and frequency. Negative for dysuria, flank pain, vaginal bleeding and vaginal discharge.    Allergies  Codeine  Home Medications    Prior to Admission medications   Medication Sig Start Date End Date Taking? Authorizing Provider  ALPRAZolam (XANAX) 0.25 MG tablet Take 0.25 mg by mouth at bedtime as needed.      Historical Provider, MD  desvenlafaxine (PRISTIQ) 50 MG 24 hr tablet Take 50 mg by mouth daily.    Historical Provider, MD  fesoterodine (TOVIAZ) 4 MG TB24 tablet Take 1 tablet (4 mg total) by mouth daily. 09/15/13   Billy Fischer, MD  fluticasone (FLONASE) 50 MCG/ACT nasal spray Place 2 sprays into both nostrils daily.    Historical Provider, MD  hydrochlorothiazide (HYDRODIURIL) 12.5 MG tablet Take 12.5 mg by mouth daily.    Historical Provider, MD  levothyroxine (SYNTHROID, LEVOTHROID) 100 MCG tablet Take 100 mcg by mouth daily before breakfast.    Historical Provider, MD  metFORMIN (GLUCOPHAGE) 500 MG tablet Take 500 mg by mouth 2 (two) times daily with a meal.    Historical Provider, MD  pantoprazole (PROTONIX) 40 MG tablet Take 40 mg by mouth 2 (two) times daily.    Historical Provider, MD  zolpidem (AMBIEN) 5 MG tablet TAKE 1 TABLET BY MOUTH AT BEDTIME AS NEEDED FOR SLEEP 07/21/13   Deatra Robinson, MD   BP 145/96  Pulse 90  Temp(Src) 97.5 F (36.4 C) (Oral)  Resp 16  SpO2 99% Physical Exam  Nursing note and vitals reviewed. Constitutional: She is oriented to person, place, and time. She appears well-developed and well-nourished.  Abdominal: Soft. Bowel sounds are normal. She exhibits no distension and no mass. There is tenderness in the suprapubic area. There is no rebound, no guarding and no CVA tenderness.  Neurological: She is alert and oriented to person, place, and time.  Skin: Skin is warm and dry.    ED Course  Procedures (including critical care time) Labs Review Labs Reviewed  POCT URINALYSIS DIP (DEVICE)    Imaging Review No results found.   MDM   1. OAB (overactive bladder)        Billy Fischer, MD 09/15/13 1136

## 2013-09-18 ENCOUNTER — Ambulatory Visit (HOSPITAL_COMMUNITY)
Admission: RE | Admit: 2013-09-18 | Discharge: 2013-09-18 | Disposition: A | Discharge status: Home / Self Care | Payer: 59 | Source: Ambulatory Visit | Attending: Cardiovascular Disease | Admitting: Cardiovascular Disease

## 2013-09-18 ENCOUNTER — Ambulatory Visit (HOSPITAL_BASED_OUTPATIENT_CLINIC_OR_DEPARTMENT_OTHER)
Admission: RE | Admit: 2013-09-18 | Discharge: 2013-09-18 | Disposition: A | Discharge status: Home / Self Care | Payer: 59 | Source: Ambulatory Visit | Attending: Cardiovascular Disease | Admitting: Cardiovascular Disease

## 2013-09-18 DIAGNOSIS — I10 Essential (primary) hypertension: Secondary | ICD-10-CM | POA: Insufficient documentation

## 2013-09-18 DIAGNOSIS — R072 Precordial pain: Secondary | ICD-10-CM

## 2013-09-18 DIAGNOSIS — R079 Chest pain, unspecified: Secondary | ICD-10-CM | POA: Insufficient documentation

## 2013-09-18 DIAGNOSIS — R0602 Shortness of breath: Secondary | ICD-10-CM

## 2013-09-18 DIAGNOSIS — R5383 Other fatigue: Secondary | ICD-10-CM

## 2013-09-18 DIAGNOSIS — R0609 Other forms of dyspnea: Secondary | ICD-10-CM | POA: Insufficient documentation

## 2013-09-18 DIAGNOSIS — R5381 Other malaise: Secondary | ICD-10-CM | POA: Insufficient documentation

## 2013-09-18 DIAGNOSIS — R0989 Other specified symptoms and signs involving the circulatory and respiratory systems: Secondary | ICD-10-CM | POA: Insufficient documentation

## 2013-09-18 DIAGNOSIS — R42 Dizziness and giddiness: Secondary | ICD-10-CM | POA: Insufficient documentation

## 2013-09-18 DIAGNOSIS — E119 Type 2 diabetes mellitus without complications: Secondary | ICD-10-CM | POA: Insufficient documentation

## 2013-09-18 DIAGNOSIS — E669 Obesity, unspecified: Secondary | ICD-10-CM | POA: Insufficient documentation

## 2013-09-18 MED ORDER — TECHNETIUM TC 99M SESTAMIBI GENERIC - CARDIOLITE
30.0000 | Freq: Once | INTRAVENOUS | Status: AC | PRN
  Administered 2013-09-18: 30 via INTRAVENOUS

## 2013-09-18 MED ORDER — TECHNETIUM TC 99M SESTAMIBI GENERIC - CARDIOLITE
10.0000 | Freq: Once | INTRAVENOUS | Status: AC | PRN
  Administered 2013-09-18: 10 via INTRAVENOUS

## 2013-09-18 MED ORDER — AMINOPHYLLINE 25 MG/ML IV SOLN
75.0000 mg | Freq: Once | INTRAVENOUS | Status: AC
  Administered 2013-09-18: 75 mg via INTRAVENOUS

## 2013-09-18 MED ORDER — REGADENOSON 0.4 MG/5ML IV SOLN
0.4000 mg | Freq: Once | INTRAVENOUS | Status: AC
  Administered 2013-09-18: 0.4 mg via INTRAVENOUS

## 2013-09-18 NOTE — Procedures (Addendum)
[place] [address] [place] [address] [phone]  Cardiology Nuclear Med Study  Tracy Barnes is a 58 y.o. female     MRN : [id number]     DOB: 1956/02/01  Procedure Date: 09/18/2013  Nuclear Med Background Indication for Stress Test:  Evaluation for Ischemia and Abnormal EKG History:  No prior cardiac or respiratory history reported. No prior NUC MPI for comparison. Cardiac Risk Factors: Hypertension, Lipids, NIDDM and Obesity  Symptoms:  Chest Pain, Dizziness, DOE, Fatigue, Light-Headedness and Palpitations   Nuclear Pre-Procedure Caffeine/Decaff Intake:  9:00pm NPO After: 7:00am   IV Site: R Forearm  IV 0.9% NS with Angio Cath:  22g  Chest Size (in):  n/a IV Started by: Rolene Course, RN  Height: 5\' 3"  (1.6 m)  Cup Size: C  BMI:  Body mass index is 34.37 kg/(m^2). Weight:  194 lb (87.998 kg)   Tech Comments:  n/a    Nuclear Med Study 1 or 2 day study: 1 day  Stress Test Type:  [place]  Order Authorizing Provider:  Quay Burow, MD   Resting Radionuclide: Technetium 77m Sestamibi  Resting Radionuclide Dose: 10.8 mCi   Stress Radionuclide:  Technetium 7m Sestamibi  Stress Radionuclide Dose: 29.9 mCi           Stress Protocol Rest HR: 71 Stress HR: 100  Rest BP: 141/97 Stress BP: 148/88  Exercise Time (min): n/a METS: n/a          Dose of Adenosine (mg):  n/a Dose of Lexiscan: 0.4 mg  Dose of Atropine (mg): n/a Dose of Dobutamine: n/a mcg/kg/min (at max HR)  Stress Test Technologist: Mellody Memos, CCT Nuclear Technologist: Otho Perl, CNMT   Rest Procedure:  Myocardial perfusion imaging was performed at rest 45 minutes following the intravenous administration of Technetium 81m Sestamibi. Stress Procedure:  The patient received IV Lexiscan 0.4 mg over 15-seconds.  Technetium 8m Sestamibi injected IV at 30-seconds.  Patient experienced flushing, dizziness and was administered 75 mg of  Aminophylline IV at 5 minutes. There were no significant changes with Lexiscan.  Quantitative spect images were obtained after a 45 minute delay.  Transient Ischemic Dilatation (Normal <1.22):  1.05 Lung/Heart Ratio (Normal <0.45):  0.36 QGS EDV:  75 ml QGS ESV:  28 ml LV Ejection Fraction: 62%  Rest ECG: NSR - Normal EKG  Stress ECG: No significant change from baseline ECG  QPS Raw Data Images:  Normal; no motion artifact; normal heart/lung ratio. Stress Images:  Normal homogeneous uptake in all areas of the myocardium. Rest Images:  Normal homogeneous uptake in all areas of the myocardium. Subtraction (SDS):  Normal  Impression Exercise Capacity:  Lexiscan with no exercise. BP Response:  Normal blood pressure response. Clinical Symptoms:  No significant symptoms noted. ECG Impression:  No significant ECG changes with Lexiscan. Comparison with Prior Nuclear Study: No previous nuclear study performed  Overall Impression:  Normal stress nuclear study.  LV Wall Motion:  NL LV Function; NL Wall Motion; EF 62%  Pixie Casino, MD, [facility] Board Certified in Nuclear Cardiology Attending Cardiologist [place], MD  09/19/2013 1:58 PM

## 2013-09-18 NOTE — Progress Notes (Signed)
2D Echocardiogram Complete.  09/18/2013   Bowden Boody, RDCS 

## 2013-09-25 ENCOUNTER — Encounter: Payer: Self-pay | Admitting: Cardiovascular Disease

## 2013-09-29 ENCOUNTER — Telehealth: Payer: Self-pay | Admitting: Cardiovascular Disease

## 2013-09-30 NOTE — Telephone Encounter (Signed)
Closed encounter °

## 2013-10-02 ENCOUNTER — Ambulatory Visit: Payer: 59 | Admitting: Cardiovascular Disease

## 2013-10-18 ENCOUNTER — Ambulatory Visit (INDEPENDENT_AMBULATORY_CARE_PROVIDER_SITE_OTHER): Payer: 59 | Admitting: Cardiovascular Disease

## 2013-10-18 ENCOUNTER — Encounter: Payer: Self-pay | Admitting: Cardiovascular Disease

## 2013-10-18 VITALS — BP 129/83 | HR 94 | Ht 63.0 in | Wt 192.7 lb

## 2013-10-18 DIAGNOSIS — R0600 Dyspnea, unspecified: Secondary | ICD-10-CM

## 2013-10-18 DIAGNOSIS — R0609 Other forms of dyspnea: Secondary | ICD-10-CM

## 2013-10-18 DIAGNOSIS — R0989 Other specified symptoms and signs involving the circulatory and respiratory systems: Secondary | ICD-10-CM

## 2013-10-18 NOTE — Assessment & Plan Note (Signed)
Tracy Barnes had a 2-D echocardiogram which was entirely normal as was a Myoview stress test. I have recommended that she be referred to a pulmonologist for further evaluation. I will see her back in 3 months. If a pulmonary etiology is not determined and she is still symptomatic I may elect to perform cardiac catheterization in the event that her stress test is a false negative.

## 2013-10-18 NOTE — Progress Notes (Signed)
Ms. Gathright is here for followup of her operation diagnostic studies. I saw her in 09/01/13 for chest tightness and shortness of breath on exertion. A 2-D echocardiogram and Myoview stress test were entirely normal.I think he would be prudent to have her primary care physician referred to a pulmonologist for further pulmonary evaluation. I will see her back in 3 months. If there is no pulmonary etiology determined for her symptoms and she is still symptomatic I may elect to perform cardiac catheterization.  Lorretta Harp, M.D., [place], [facility], [place] [address] [place], [place]  [address]  [phone] 10/18/2013 4:00 PM

## 2013-10-18 NOTE — Patient Instructions (Signed)
Your physician wants you to follow-up in: 3 months with Dr Berry. You will receive a reminder letter in the mail two months in advance. If you don't receive a letter, please call our office to schedule the follow-up appointment.  

## 2013-10-18 NOTE — Telephone Encounter (Signed)
Encounter complete. 

## 2013-10-23 ENCOUNTER — Other Ambulatory Visit: Payer: 59

## 2013-10-25 ENCOUNTER — Telehealth: Payer: Self-pay | Admitting: Oncology

## 2013-10-30 ENCOUNTER — Ambulatory Visit: Payer: 59 | Admitting: Oncology

## 2013-11-22 ENCOUNTER — Other Ambulatory Visit: Payer: Self-pay | Admitting: *Deleted

## 2013-11-22 DIAGNOSIS — C801 Malignant (primary) neoplasm, unspecified: Secondary | ICD-10-CM

## 2013-11-22 DIAGNOSIS — C50919 Malignant neoplasm of unspecified site of unspecified female breast: Secondary | ICD-10-CM

## 2013-11-23 ENCOUNTER — Other Ambulatory Visit (HOSPITAL_BASED_OUTPATIENT_CLINIC_OR_DEPARTMENT_OTHER): Payer: 59

## 2013-11-23 DIAGNOSIS — C50319 Malignant neoplasm of lower-inner quadrant of unspecified female breast: Secondary | ICD-10-CM

## 2013-11-23 DIAGNOSIS — C50919 Malignant neoplasm of unspecified site of unspecified female breast: Secondary | ICD-10-CM

## 2013-11-23 DIAGNOSIS — C801 Malignant (primary) neoplasm, unspecified: Secondary | ICD-10-CM

## 2013-11-23 LAB — COMPREHENSIVE METABOLIC PANEL (CC13)
ALT: 17 U/L (ref 0–55)
AST: 13 U/L (ref 5–34)
Albumin: 4 g/dL (ref 3.5–5.0)
Alkaline Phosphatase: 73 U/L (ref 40–150)
Anion Gap: 11 mEq/L (ref 3–11)
BUN: 16.1 mg/dL (ref 7.0–26.0)
CHLORIDE: 103 meq/L (ref 98–109)
CO2: 25 mEq/L (ref 22–29)
Calcium: 9.6 mg/dL (ref 8.4–10.4)
Creatinine: 1 mg/dL (ref 0.6–1.1)
Glucose: 159 mg/dl — ABNORMAL HIGH (ref 70–140)
Potassium: 3.8 mEq/L (ref 3.5–5.1)
Sodium: 139 mEq/L (ref 136–145)
Total Bilirubin: 0.42 mg/dL (ref 0.20–1.20)
Total Protein: 7 g/dL (ref 6.4–8.3)

## 2013-11-23 LAB — CBC WITH DIFFERENTIAL/PLATELET
BASO%: 1 % (ref 0.0–2.0)
Basophils Absolute: 0.1 10*3/uL (ref 0.0–0.1)
EOS%: 2.5 % (ref 0.0–7.0)
Eosinophils Absolute: 0.2 10*3/uL (ref 0.0–0.5)
HCT: 42.5 % (ref 34.8–46.6)
HGB: 13.6 g/dL (ref 11.6–15.9)
LYMPH#: 1.3 10*3/uL (ref 0.9–3.3)
LYMPH%: 19.9 % (ref 14.0–49.7)
MCH: 23.4 pg — AB (ref 25.1–34.0)
MCHC: 31.9 g/dL (ref 31.5–36.0)
MCV: 73.3 fL — ABNORMAL LOW (ref 79.5–101.0)
MONO#: 0.4 10*3/uL (ref 0.1–0.9)
MONO%: 6.1 % (ref 0.0–14.0)
NEUT#: 4.4 10*3/uL (ref 1.5–6.5)
NEUT%: 70.5 % (ref 38.4–76.8)
Platelets: 296 10*3/uL (ref 145–400)
RBC: 5.8 10*6/uL — ABNORMAL HIGH (ref 3.70–5.45)
RDW: 16.2 % — ABNORMAL HIGH (ref 11.2–14.5)
WBC: 6.3 10*3/uL (ref 3.9–10.3)

## 2013-11-30 ENCOUNTER — Telehealth: Payer: Self-pay | Admitting: Hematology and Oncology

## 2013-11-30 ENCOUNTER — Ambulatory Visit (HOSPITAL_BASED_OUTPATIENT_CLINIC_OR_DEPARTMENT_OTHER): Payer: 59 | Admitting: Nurse Practitioner

## 2013-11-30 VITALS — BP 145/78 | HR 83 | Temp 98.3°F | Resp 20 | Ht 63.0 in | Wt 190.6 lb

## 2013-11-30 DIAGNOSIS — N951 Menopausal and female climacteric states: Secondary | ICD-10-CM | POA: Diagnosis not present

## 2013-11-30 DIAGNOSIS — Z853 Personal history of malignant neoplasm of breast: Secondary | ICD-10-CM | POA: Diagnosis not present

## 2013-11-30 DIAGNOSIS — R0609 Other forms of dyspnea: Secondary | ICD-10-CM

## 2013-11-30 DIAGNOSIS — R0600 Dyspnea, unspecified: Secondary | ICD-10-CM

## 2013-11-30 DIAGNOSIS — C50919 Malignant neoplasm of unspecified site of unspecified female breast: Secondary | ICD-10-CM

## 2013-11-30 NOTE — Telephone Encounter (Signed)
per pof to sch pt appt-cld to make appt w.Pulmonary-gave pt Dr name time & date of appt & location-pt understood-gave copy of sch

## 2013-11-30 NOTE — Progress Notes (Addendum)
OFFICE PROGRESS NOTE  CC  Tracy Barnes,NANCY, MD [address] [place] [address] Dr. Thea Silversmith Dr. 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  PRIOR THERAPY:  #1 patient underwent a screening mammogram in October 2011 which showed calcifications. She subsequently had an ultrasound that showed 2 groups of suspicious microcalcifications in the left lower breast. 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 The left core biopsy of the area more posterior and lateral showed a high-grade DCIS with necrosis and calcifications. The invasive component was ER negative PR negative with a Ki-67 of 60% and HER-2/neu was amplified with a ratio of 2.79. Patient had MRI of the breasts performed that only showed post biopsy changes.  #2 patient underwent a lumpectomy with sentinel lymph node biopsy. The final pathology revealed residual high-grade DCIS with negative margins and 4 sentinel nodes were negative for metastatic disease.  #3 patient subsequently received adjuvant chemotherapy initially consisting of 9 weeks of Taxol and Herceptin. She then received weekly Herceptin with radiation. Once patient completed the radiation she received Herceptin every 3 weeks as maintenance which he finished on 04/17/2011.  CURRENT THERAPY: Observation  INTERVAL HISTORY: Tracy Barnes 58 y.o. female returns for follow up with her breast cancer.The interval history is positive a recurrent head cold/sinus infection that began in October of last year. It would never quite go away, and at some point she became short of breath with exertion and chest pain. Dr. Forde Dandy referred her to Dr. Gwenlyn Found for a cardiology workup, but all tests were negative. Gwenlyn Found suggests a pulmonology referral instead. At  this current time, Ashanti is experiencing none of these symptoms.   MEDICAL HISTORY: Past Medical History  Diagnosis Date  . Cancer 02/19/2010    left breast  . Hx of radiation therapy 06/18/10 to 08/01/10    L breast  . Diabetes mellitus   . Hypertension   . Hyperlipidemia   . Dyspnea on exertion   . Chest pain     ALLERGIES:  is allergic to codeine.  MEDICATIONS:  Current Outpatient Prescriptions  Medication Sig Dispense Refill  . ALPRAZolam (XANAX) 0.25 MG tablet Take 0.25 mg by mouth at bedtime as needed.        . desvenlafaxine (PRISTIQ) 50 MG 24 hr tablet Take 50 mg by mouth daily. Pt takes 100 mg a day.      . hydrochlorothiazide (HYDRODIURIL) 12.5 MG tablet Take 12.5 mg by mouth daily.      Marland Kitchen levothyroxine (SYNTHROID, LEVOTHROID) 100 MCG tablet Take 100 mcg by mouth daily before breakfast.      . metFORMIN (GLUCOPHAGE) 500 MG tablet Take 500 mg by mouth 2 (two) times daily with a meal.      . pantoprazole (PROTONIX) 40 MG tablet Take 40 mg by mouth 2 (two) times daily.      Marland Kitchen zolpidem (AMBIEN) 5 MG tablet TAKE 1 TABLET BY MOUTH AT BEDTIME AS NEEDED FOR SLEEP  30 tablet  0  . fluticasone (FLONASE) 50 MCG/ACT nasal spray Place 2 sprays into both nostrils daily. Pt uses as needed.       No current facility-administered medications for this visit.    SURGICAL HISTORY:  Past Surgical History  Procedure Laterality Date  . Abdominal hysterectomy      At 57 yrs of age. One ovary  removed. Surgery due to fibroids per medical history form dated 02/25/10.  . Breast surgery  03/25/2010    left lumpectomy  . Deep axillary sentinel node biopsy / excision  03/25/2010    4/4 nodes negative  . Portacath placement  04/18/2010    Dr. Fanny Skates  . Port-a-cath removal  06/05/2011    Procedure: MINOR REMOVAL PORT-A-CATH;  Surgeon: Adin Hector, MD;  Location: [facility];  Service: General;  Laterality: N/A;  right    REVIEW OF SYSTEMS:  Pertinent items are noted in  HPI.   PHYSICAL EXAMINATION: Blood pressure 145/78, pulse 83, temperature 98.3 F (36.8 C), temperature source Oral, resp. rate 20, height _0  (1.6 m), weight 190 lb 9.6 oz (86.456 kg). Body mass index is 33.77 kg/(m^2). ECOG PERFORMANCE STATUS: 0 - Asymptomatic  Skin: warm, dry  HEENT: sclerae anicteric, conjunctivae pink, oropharynx clear. No thrush or mucositis.  Lymph Nodes: No cervical or supraclavicular lymphadenopathy  Lungs: clear to auscultation bilaterally, no rales, wheezes, or rhonci  Heart: regular rate and rhythm  Abdomen: round, soft, non tender, positive bowel sounds  Musculoskeletal: No focal spinal tenderness, no peripheral edema  Neuro: non focal, well oriented, positive affect  Breast. Left breast status post lumpectomy, no evidence of recurrent disease, left axilla benign, right breast unremarkable.   LABORATORY DATA: Lab Results  Component Value Date   WBC 6.3 11/23/2013   HGB 13.6 11/23/2013   HCT 42.5 11/23/2013   MCV 73.3* 11/23/2013   PLT 296 11/23/2013      Chemistry      Component Value Date/Time   NA 139 11/23/2013 1327   NA 138 10/19/2011 1446   K 3.8 11/23/2013 1327   K 4.5 10/19/2011 1446   CL 105 10/03/2012 0854   CL 103 10/19/2011 1446   CO2 25 11/23/2013 1327   CO2 24 10/19/2011 1446   BUN 16.1 11/23/2013 1327   BUN 18 10/19/2011 1446   CREATININE 1.0 11/23/2013 1327   CREATININE 0.76 10/19/2011 1446      Component Value Date/Time   CALCIUM 9.6 11/23/2013 1327   CALCIUM 9.1 10/19/2011 1446   ALKPHOS 73 11/23/2013 1327   ALKPHOS 85 10/19/2011 1446   AST 13 11/23/2013 1327   AST 20 10/19/2011 1446   ALT 17 11/23/2013 1327   ALT 18 10/19/2011 1446   BILITOT 0.42 11/23/2013 1327   BILITOT 0.2* 10/19/2011 1446       RADIOGRAPHIC STUDIES: Most recent mammogram performed on 02/09/13 was unremarkable.   ASSESSMENT: 58 year old female with  #1 T1 N0 invasive ductal carcinoma of the left breast with high-grade invasive ductal carcinoma Patient is status post left breast  lumpectomy with sentinel lymph node biopsies. Her final pathology reveals micro-invasion infiltrating ductal carcinoma that was ER negative PR negative HER-2/neu positive with a Ki-67 of 60%. HER-2 was amplified with a ratio of 2.79. Patient received adjuvant chemotherapy and Herceptin. Chemotherapy consisted of weekly Taxol with Herceptin for 9 weeks followed by radiation and weekly Herceptin. When she completed this that she went on to receive maintenance Herceptin every 3 weeks completing on 04/17/2011. She has no evidence of recurrent disease.  #2 patient continues to do well she is on observation only no evidence of recurrent disease.  #3 hot flashes: She is on Effexor 75 mg daily tolerating it well with some reduction in the intensity of the hot flashes.  PLAN:  Lalita is doing well as far as her breast cancer is concerned. She  is happy on the pristiq for her hot flashes. I went ahead and made the referral to pulmonology to investigate her shortness of breath further.   The plan is to continue observation every 6 months. Her next appt will be in February with Dr. Lindi Adie for an office visit and labs. Angelle understands and agrees with the plan. She has been encouraged to call with any issues that arises before her next visit here.    Marcelino Duster, NP Medical/Oncology [facility] [phone] (beeper) [phone] (Office)  11/30/2013, 5:19 PM

## 2013-12-04 ENCOUNTER — Encounter: Payer: Self-pay | Admitting: Internal Medicine

## 2013-12-04 ENCOUNTER — Ambulatory Visit (INDEPENDENT_AMBULATORY_CARE_PROVIDER_SITE_OTHER): Payer: 59 | Admitting: Internal Medicine

## 2013-12-04 VITALS — BP 144/82 | HR 78 | Ht 63.0 in | Wt 190.0 lb

## 2013-12-04 DIAGNOSIS — R0609 Other forms of dyspnea: Principal | ICD-10-CM

## 2013-12-04 DIAGNOSIS — R0600 Dyspnea, unspecified: Secondary | ICD-10-CM

## 2013-12-04 DIAGNOSIS — R911 Solitary pulmonary nodule: Secondary | ICD-10-CM

## 2013-12-04 NOTE — Progress Notes (Signed)
Subjective:    Patient ID: Tracy Barnes, female    DOB: 12-20-1955, 58 y.o.   MRN: [id number] PCP Margarita Rana, MD   HPI  IOV 12/04/2013  Chief Complaint  Patient presents with  . Pulmonary Consult    Referred by Tona Sensing, NP for DOE x October 142    58 year old obese female referred for dyspnea evaluation. In 2010 and had breast cancer and then was on Herceptin. During this time she did have some dyspnea and possibly some cough but this resolved by the time treatment ended in 2011. She was doing completely well to October 2014 when she suffered a cold and since then has had dyspnea on exertion that is associated with significant chest tightness in the upper part of the chest radiating to the neck. Symptoms are relieved only by rest. 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 There is no associated radiation to the back or to the arm or associated wheezing or cough or edema or syncope or paroxysmal nocturnal dyspnea or orthopnea. Symptoms are progressive and out of moderate intensity. Cardiac evaluation is  noncontributory. She has not tried any therapeutic inhalers or pulmonary rehabilitation for relief of dyspnea  Expo hx and dyspnea relevant hx   CT chest 04/15/2010 - clear except for 73mm RML nodule non-calcified     reports that she quit smoking about 36 years ago. Her smoking use included Cigarettes. She has a .05 pack-year smoking history. She has never used smokeless tobacco.  Estimated body mass index is 33.67 kg/(m^2) as calculated from the following:   Height as of this encounter: 5\' 3"  (1.6 m).   Weight as of this encounter: 190 lb (86.183 kg).   Nuclear mediciner perf test and ech- June 2015: Normal  Walk test 185 feet x 3 laps: did not desaturate but got dyspneic at 2nd lap   Past Medical History  Diagnosis Date  . Cancer 02/19/2010    left breast  . Hx of radiation therapy  06/18/10 to 08/01/10    L breast  . Diabetes mellitus   . Hypertension   . Hyperlipidemia   . Dyspnea on exertion   . Chest pain      Family History  Problem Relation Age of Onset  . Cancer Mother     Cancer of bladder and brain per medical history form dated 02/25/10.  Marland Kitchen Heart disease Father   . COPD Father   . Aortic stenosis Father      History   Social History  . Marital Status: Widowed    Spouse Name: N/A    Number of Children: N/A  . Years of Education: N/A   Occupational History  . 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 Smoking status: Former Smoker -- 0.10 packs/day for .5 years    Types: Cigarettes    Quit date: 04/20/1977  . Smokeless tobacco: Never Used  . Alcohol Use: No  . Drug Use: No  . Sexual Activity: Yes   Other Topics Concern  . Not on file   Social History Narrative  . No narrative on file     Allergies  Allergen Reactions  . Codeine Itching    Updated per [facility].     Outpatient Prescriptions Prior to Visit  Medication Sig Dispense Refill  . ALPRAZolam (XANAX) 0.25 MG tablet Take 0.25 mg by mouth at bedtime as needed.        Marland Kitchen  desvenlafaxine (PRISTIQ) 50 MG 24 hr tablet Take 50 mg by mouth daily. Pt takes 100 mg a day.      . fluticasone (FLONASE) 50 MCG/ACT nasal spray Place 2 sprays into both nostrils daily. Pt uses as needed.      . hydrochlorothiazide (HYDRODIURIL) 12.5 MG tablet Take 12.5 mg by mouth daily.      Marland Kitchen levothyroxine (SYNTHROID, LEVOTHROID) 100 MCG tablet Take 100 mcg by mouth daily before breakfast.      . metFORMIN (GLUCOPHAGE) 500 MG tablet Take 500 mg by mouth 2 (two) times daily with a meal.      . pantoprazole (PROTONIX) 40 MG tablet Take 40 mg by mouth 2 (two) times daily.      Marland Kitchen zolpidem (AMBIEN) 5 MG tablet TAKE 1 TABLET BY MOUTH AT BEDTIME AS NEEDED FOR SLEEP  30 tablet  0   No facility-administered medications prior to visit.       Review of Systems  Constitutional: Negative for fever  and unexpected weight change.  HENT: Negative for congestion, dental problem, ear pain, nosebleeds, postnasal drip, rhinorrhea, sinus pressure, sneezing, sore throat and trouble swallowing.   Eyes: Negative for redness and itching.  Respiratory: Positive for chest tightness and shortness of breath. Negative for cough and wheezing.   Cardiovascular: Positive for palpitations. Negative for leg swelling.  Gastrointestinal: Negative for nausea and vomiting.  Genitourinary: Negative for dysuria.  Musculoskeletal: Negative for joint swelling.  Skin: Negative for rash.  Neurological: Positive for dizziness. Negative for headaches.  Hematological: Does not bruise/bleed easily.  Psychiatric/Behavioral: Negative for dysphoric mood. The patient is not nervous/anxious.        Objective:   Physical Exam  Vitals reviewed. Constitutional: She is oriented to person, place, and time. She appears well-developed and well-nourished. No distress.  HENT:  Head: Normocephalic and atraumatic.  Right Ear: External ear normal.  Left Ear: External ear normal.  Mouth/Throat: Oropharynx is clear and moist. No oropharyngeal exudate.  Eyes: Conjunctivae and EOM are normal. Pupils are equal, round, and reactive to light. Right eye exhibits no discharge. Left eye exhibits no discharge. No scleral icterus.  Neck: Normal range of motion. Neck supple. No JVD present. No tracheal deviation present. No thyromegaly present.  Cardiovascular: Normal rate, regular rhythm, normal heart sounds and intact distal pulses.  Exam reveals no gallop and no friction rub.   No murmur heard. Pulmonary/Chest: Effort normal and breath sounds normal. No respiratory distress. She has no wheezes. She has no rales. She exhibits no tenderness.  Scar right infra clav from old porto cath site  Abdominal: Soft. Bowel sounds are normal. She exhibits no distension and no mass. There is no tenderness. There is no rebound and no guarding.    Musculoskeletal: Normal range of motion. She exhibits no edema and no tenderness.  Lymphadenopathy:    She has no cervical adenopathy.  Neurological: She is alert and oriented to person, place, and time. She has normal reflexes. No cranial nerve deficit. She exhibits normal muscle tone. Coordination normal.  Skin: Skin is warm and dry. No rash noted. She is not diaphoretic. No erythema. No pallor.  Psychiatric: She has a normal mood and affect. Her behavior is normal. Judgment and thought content normal.    Filed Vitals:   12/04/13 1537  BP: 144/82  Pulse: 78  Height: 5\' 3"  (1.6 m)  Weight: 190 lb (86.183 kg)  SpO2: 98%         Assessment & Plan:  #Shortness of  breath  - unclear why  - do full PFT test at [facility]; call [phone] and leave message for me once done - do HRCT chest - based on results of PFT will advice next step and if you need bike stress test  #Lung nodule Right middle lobe 57mm  - 2011  - do High Resolution CT chest without contrast on ILD protocol - for dyspnea and lung nodule   #Followup  -based on my phone call of results

## 2013-12-04 NOTE — Patient Instructions (Signed)
#  Shortness of breath  - unclear why  - do full PFT test at [facility]; call [phone] and leave message for me once done - based on results of PFT will advice next step and if you need bike stress test  #Lung nodule Right middle lobe 63mm  - 2011  - do High Resolution CT chest without contrast on ILD protocol - for dyspnea and lung nodule   #Followup  -based on my phone call of results

## 2013-12-07 ENCOUNTER — Ambulatory Visit (HOSPITAL_COMMUNITY)
Admission: RE | Admit: 2013-12-07 | Discharge: 2013-12-07 | Disposition: A | Discharge status: Home / Self Care | Payer: 59 | Source: Ambulatory Visit | Attending: Internal Medicine | Admitting: Internal Medicine

## 2013-12-07 ENCOUNTER — Ambulatory Visit (INDEPENDENT_AMBULATORY_CARE_PROVIDER_SITE_OTHER)
Admission: RE | Admit: 2013-12-07 | Discharge: 2013-12-07 | Disposition: A | Discharge status: Home / Self Care | Payer: 59 | Source: Ambulatory Visit | Attending: Internal Medicine | Admitting: Internal Medicine

## 2013-12-07 ENCOUNTER — Telehealth: Payer: Self-pay | Admitting: Internal Medicine

## 2013-12-07 DIAGNOSIS — R0609 Other forms of dyspnea: Secondary | ICD-10-CM | POA: Diagnosis present

## 2013-12-07 DIAGNOSIS — R0600 Dyspnea, unspecified: Secondary | ICD-10-CM

## 2013-12-07 DIAGNOSIS — R0989 Other specified symptoms and signs involving the circulatory and respiratory systems: Secondary | ICD-10-CM | POA: Insufficient documentation

## 2013-12-07 DIAGNOSIS — R911 Solitary pulmonary nodule: Secondary | ICD-10-CM

## 2013-12-07 LAB — PULMONARY FUNCTION TEST
DL/VA % pred: 114 %
DL/VA: 5.35 ml/min/mmHg/L
DLCO COR: 20.01 ml/min/mmHg
DLCO cor % pred: 87 %
DLCO unc % pred: 87 %
DLCO unc: 20.01 ml/min/mmHg
FEF 25-75 Post: 3.29 L/sec
FEF 25-75 Pre: 3.31 L/sec
FEF2575-%CHANGE-POST: 0 %
FEF2575-%PRED-PRE: 134 %
FEF2575-%Pred-Post: 133 %
FEV1-%Change-Post: -1 %
FEV1-%PRED-PRE: 89 %
FEV1-%Pred-Post: 88 %
FEV1-Post: 2.26 L
FEV1-Pre: 2.3 L
FEV1FVC-%Change-Post: 4 %
FEV1FVC-%PRED-PRE: 110 %
FEV6-%CHANGE-POST: -5 %
FEV6-%PRED-POST: 77 %
FEV6-%Pred-Pre: 82 %
FEV6-Post: 2.49 L
FEV6-Pre: 2.64 L
FEV6FVC-%Pred-Post: 103 %
FEV6FVC-%Pred-Pre: 103 %
FVC-%CHANGE-POST: -5 %
FVC-%PRED-POST: 75 %
FVC-%Pred-Pre: 80 %
FVC-Post: 2.49 L
FVC-Pre: 2.64 L
POST FEV1/FVC RATIO: 91 %
Post FEV6/FVC ratio: 100 %
Pre FEV1/FVC ratio: 87 %
Pre FEV6/FVC Ratio: 100 %
RV % pred: 73 %
RV: 1.35 L
TLC % pred: 83 %
TLC: 4.07 L

## 2013-12-07 MED ORDER — ALBUTEROL SULFATE (2.5 MG/3ML) 0.083% IN NEBU
2.5000 mg | INHALATION_SOLUTION | Freq: Once | RESPIRATORY_TRACT | Status: AC
  Administered 2013-12-07: 2.5 mg via RESPIRATORY_TRACT

## 2013-12-07 NOTE — Telephone Encounter (Signed)
FYI for MR - Pt had PFT and CT done today

## 2013-12-10 ENCOUNTER — Telehealth: Payer: Self-pay | Admitting: Internal Medicine

## 2013-12-10 DIAGNOSIS — R911 Solitary pulmonary nodule: Secondary | ICD-10-CM | POA: Insufficient documentation

## 2013-12-10 DIAGNOSIS — R0600 Dyspnea, unspecified: Secondary | ICD-10-CM

## 2013-12-10 DIAGNOSIS — R0609 Other forms of dyspnea: Principal | ICD-10-CM

## 2013-12-10 NOTE — Assessment & Plan Note (Signed)
#  Shortness of breath  - unclear why  - do full PFT test at [facility]; call [phone] and leave message for me once done - do HRCT chest - based on results of PFT will advice next step and if you need bike stress test   #Followup  -based on my phone call of results

## 2013-12-10 NOTE — Telephone Encounter (Signed)
Hi Guys (Heather or Vinay or Gus)  So her CT chest 12/07/13  shows a 1.9cm mediastinal node. Calio for me to get PET scan from your standpoint?  [place]  Dr. Brand Males, M.D., F.C.C.P Pulmonary and Critical Care Medicine Staff Physician [place] [facility] Pager: [phone], If no answer or between  15:00h - 7:00h: call [phone]  12/10/2013 10:45 PM

## 2013-12-10 NOTE — Assessment & Plan Note (Signed)
#  Lung nodule Right middle lobe 53mm  - 2011  - do High Resolution CT chest without contrast on ILD protocol - for dyspnea and lung nodule

## 2013-12-11 NOTE — Telephone Encounter (Signed)
Sure  GM

## 2013-12-12 ENCOUNTER — Telehealth: Payer: Self-pay | Admitting: Internal Medicine

## 2013-12-12 NOTE — Telephone Encounter (Signed)
I tried to call her to give message personally but went to voice mail. So, triage please call her  1. Let her know PFT 12/07/13 normal (for my review: some flow volume loop variation +)  2/. CT chest 12/07/13: There is some pulmonary nodules small < 24mm that in right lower lobe - new since 2011 and also new mediastinal node - new since 2011. To better understand this she needs PET Scan; please order. Let her know I cleared the PET scan with Dr Jana Hakim in the oncology depet  HEr followup is to be with me after PET Scan  Dr. Brand Males, M.D., [facility].C.P Pulmonary and Critical Care Medicine Staff Physician [place] [facility] Pager: [phone], If no answer or between  15:00h - 7:00h: call [phone]  12/12/2013 2:34 PM

## 2013-12-12 NOTE — Telephone Encounter (Signed)
I spoke with the pt and advised of results. She is ok to proceed with PET scan. Order placed.[place] Bing, CMA

## 2013-12-12 NOTE — Telephone Encounter (Signed)
What time of ct is she having.Tracy Barnes

## 2013-12-12 NOTE — Telephone Encounter (Signed)
LMTCB

## 2013-12-13 NOTE — Telephone Encounter (Signed)
Pt advised and appt set., MR the pt wants a copy of her last OV Note mailed to her home, is this ok with you? [place] Bing, CMA

## 2013-12-13 NOTE — Telephone Encounter (Signed)
It is PET scan  Keep the appt 2 weeks out for now but will call with results and then decide if she needs to be worked in sooner

## 2013-12-13 NOTE — Telephone Encounter (Signed)
That is fine to mail my ov note to her

## 2013-12-13 NOTE — Telephone Encounter (Signed)
Called, spoke with pt - answered her question - re: is she having PET or just scan of chest.  Advised it is PET.   MR, per phone msg on 12/07/13, pt's followup is to be with you after PET Scan.  PET scan is scheduled for Dec 15, 2013.  Your next available after PET is 2 wks out.  Please advise if you would like to work pt in or if you can call her with results.  She doesn't want to wait 2 wks to receive these.  Thank you.

## 2013-12-13 NOTE — Telephone Encounter (Signed)
Note mailed. [place] Bing, CMA

## 2013-12-15 ENCOUNTER — Ambulatory Visit (HOSPITAL_COMMUNITY)
Admission: RE | Admit: 2013-12-15 | Discharge: 2013-12-15 | Disposition: A | Discharge status: Home / Self Care | Payer: 59 | Source: Ambulatory Visit | Attending: Internal Medicine | Admitting: Internal Medicine

## 2013-12-15 ENCOUNTER — Encounter (HOSPITAL_COMMUNITY): Payer: Self-pay

## 2013-12-15 ENCOUNTER — Telehealth: Payer: Self-pay | Admitting: Internal Medicine

## 2013-12-15 DIAGNOSIS — C50919 Malignant neoplasm of unspecified site of unspecified female breast: Secondary | ICD-10-CM | POA: Diagnosis not present

## 2013-12-15 DIAGNOSIS — K7689 Other specified diseases of liver: Secondary | ICD-10-CM | POA: Insufficient documentation

## 2013-12-15 DIAGNOSIS — K5730 Diverticulosis of large intestine without perforation or abscess without bleeding: Secondary | ICD-10-CM | POA: Insufficient documentation

## 2013-12-15 DIAGNOSIS — J984 Other disorders of lung: Secondary | ICD-10-CM | POA: Insufficient documentation

## 2013-12-15 DIAGNOSIS — R911 Solitary pulmonary nodule: Secondary | ICD-10-CM

## 2013-12-15 LAB — GLUCOSE, CAPILLARY: Glucose-Capillary: 108 mg/dL — ABNORMAL HIGH (ref 70–99)

## 2013-12-15 MED ORDER — FLUDEOXYGLUCOSE F - 18 (FDG) INJECTION: 9.3000 | Freq: Once | INTRAVENOUS | Status: AC | PRN

## 2013-12-15 NOTE — Telephone Encounter (Addendum)
   D/w patient - explained below results. Needs bx: Consider EBUS by pulm v EBUS +/- Med by CVTS. Will get back to her. IF she does not hear from me by 12/20/13 she is to call back     Nm Pet Image Initial (pi) Skull Base To Thigh  12/15/2013   CLINICAL DATA:  Initial treatment strategy for lung nodule. Patient also has a history of left-sided breast cancer status post lumpectomy.  EXAM: NUCLEAR MEDICINE PET SKULL BASE TO THIGH  TECHNIQUE: 9.3 mCi F-18 FDG was injected intravenously. Full-ring PET imaging was performed from the skull base to thigh after the radiotracer. CT data was obtained and used for attenuation correction and anatomic localization.  FASTING BLOOD GLUCOSE:  Value: 108 mg/dl  COMPARISON:  Chest CT 12/07/2013.  FINDINGS: NECK  No hypermetabolic lymph nodes in the neck.  CHEST  Extensive borderline enlarged and enlarged mediastinal and bilateral hilar lymph nodes demonstrate hypermetabolism. The largest of these lymph nodes is a 1.9 cm short axis low right paratracheal lymph node (SUVmax = 5.5). Mediastinal and hilar lymph nodes are diffusely hypermetabolic ranging from SUVmax of 3.0-5.8. Numerous small pulmonary nodules again seen scattered throughout the lungs bilaterally, largest of which measures 7 mm in the periphery of the right upper lobe (image 33 of series 7). None of these demonstrate definite hypermetabolism on the PET portion of the examination. The previously described 7 x 8 mm nodular opacity in the medial aspect of the right upper lobe is favored to represent an interlobar lymph node, and is inseparable from the adjacent right hilar adenopathy on today's examination. No acute consolidative airspace disease. No pleural effusions.  ABDOMEN/PELVIS  No abnormal hypermetabolic activity within the liver, pancreas, adrenal glands, or spleen. No hypermetabolic lymph nodes in the abdomen or pelvis. Diffuse low attenuation throughout the hepatic parenchyma, compatible with hepatic  steatosis. Some focal fatty sparing adjacent to the gallbladder fossa. The unenhanced appearance of the gallbladder, pancreas, spleen, bilateral adrenal glands and the left kidney is unremarkable. In the interpolar region of the right kidney there is a 2 mm calcification which may represent a nonobstructive calculus or a vascular calcification. Numerous colonic diverticulae are noted, particularly in the region of the sigmoid colon, without surrounding inflammatory changes to suggest an acute diverticulitis at this time. No significant volume of ascites. No pneumoperitoneum. No pathologic distention of small bowel. Status post hysterectomy. Ovaries are not confidently identified may be surgically absent or atrophic. Urinary bladder is unremarkable in appearance.  SKELETON  No focal hypermetabolic activity to suggest skeletal metastasis.  IMPRESSION: 1. Mediastinal and bilateral hilar lymphadenopathy is most suspicious for a lymphoproliferative disorder such as lymphoma. 2. There are multiple small pulmonary nodules which appear unchanged compared to the recent prior examination. These are nonspecific, and some of them are similar to remote prior study 04/15/2010, suggestive of a benign etiology. Continued attention on future follow-up imaging is recommended. 3. Hepatic steatosis. 4. Colonic diverticulosis without findings to suggest acute diverticulitis at this time.   Electronically Signed   By: Vinnie Langton M.D.   On: 12/15/2013 08:45

## 2013-12-15 NOTE — Telephone Encounter (Signed)
MR pt called to make you aware that she had her CT done at [facility] this morning.

## 2013-12-18 ENCOUNTER — Other Ambulatory Visit: Payer: Self-pay | Admitting: *Deleted

## 2013-12-18 ENCOUNTER — Telehealth: Payer: Self-pay | Admitting: Internal Medicine

## 2013-12-18 NOTE — Telephone Encounter (Signed)
Patient calling requesting detail about CT results. States that she spoke with MR and has forgotten everything that he told her regarding her CT and future plans. Pt requesting a phone call to explain this again so that she can write it all down.   Please advise Dr Chase Caller. Thanks.

## 2013-12-18 NOTE — Telephone Encounter (Signed)
Spoke to patient. She was not upset. She just wanted re-explanation because her emotions got ahead of her on Friday.   1. Explained PET hot mediastinal nodes - plan: please refer her to CVTS Dr Servando Snare or Dr Roxan Hockey (thoraic team) for evaluation of EBUS +/- mediastinascoopy in one setting for this indication. She   2. Dyspnea: she will need CPST because PFT is normal. And CT does not show ILD. But this can wait after above. So for now I will hold off dyspnea workup. NTD for triage  3. Followup  - give her fu in 4-6 weeks with me  Dr. Brand Males, M.D., [facility].C.P Pulmonary and Critical Care Medicine Staff Physician [place] [facility] Pager: [phone], If no answer or between  15:00h - 7:00h: call [phone]  12/18/2013 10:35 AM

## 2013-12-18 NOTE — Addendum Note (Signed)
Addended by: Virl Cagey on: 12/18/2013 11:12 AM   Modules accepted: Orders

## 2013-12-18 NOTE — Telephone Encounter (Signed)
Pt is upset over the results and would like to talk to nurse about these. [phone]

## 2013-12-18 NOTE — Telephone Encounter (Signed)
Virl Cagey, CMA at 12/18/2013 10:11 AM     Status: Signed        Patient calling requesting detail about CT results. States that she spoke with MR and has forgotten everything that he told her regarding her CT and future plans. Pt requesting a phone call to explain this again so that she can write it all down.  Please advise Dr Chase Caller. Thanks.    Message sent to MR in previous message open dated 12/10/13. Will close this message

## 2013-12-18 NOTE — Telephone Encounter (Addendum)
Order placed for referral to CVTS for eval of EBUS Pt aware order placed  Pt appt scheduled for 01/16/14 at 3:45 Nothing further needed.

## 2013-12-19 ENCOUNTER — Encounter: Payer: Self-pay | Admitting: Thoracic Surgery (Cardiothoracic Vascular Surgery)

## 2013-12-19 ENCOUNTER — Other Ambulatory Visit: Payer: Self-pay

## 2013-12-19 ENCOUNTER — Institutional Professional Consult (permissible substitution) (INDEPENDENT_AMBULATORY_CARE_PROVIDER_SITE_OTHER): Payer: 59 | Admitting: Thoracic Surgery (Cardiothoracic Vascular Surgery)

## 2013-12-19 VITALS — BP 155/94 | HR 105 | Resp 16 | Ht 63.0 in | Wt 190.0 lb

## 2013-12-19 DIAGNOSIS — R590 Localized enlarged lymph nodes: Secondary | ICD-10-CM | POA: Insufficient documentation

## 2013-12-19 DIAGNOSIS — D381 Neoplasm of uncertain behavior of trachea, bronchus and lung: Secondary | ICD-10-CM

## 2013-12-19 DIAGNOSIS — R599 Enlarged lymph nodes, unspecified: Secondary | ICD-10-CM

## 2013-12-19 NOTE — Progress Notes (Signed)
PCP is Sheela Stack, MD Referring Provider is Brand Males, MD  Chief Complaint  Patient presents with  . Shortness of Breath    lung nodule..referred by Dr. Chase Caller..CT CHEST 12/07/12.Marland KitchenPET 12/15/13...PFT'S 12/07/13    HPI: 58 year old woman who presents with chief complaint of chest tightness and shortness of breath for the past year.  Tracy Barnes is a 58 year old woman with a history of diabetes and breast cancer. She had a respiratory infection about a year ago. Since that time she continued to have shortness of breath with exertion associated with chest discomfort. She has had an extensive workup including a normal echocardiogram and a negative stress Myoview. She had full pulmonary function testing which revealed normal lung function. In August she saw Dr. Chase Caller who ordered a CT of the chest. It showed an 8 mm right lung nodule and extensive hilar and mediastinal adenopathy. A PET CT showed the lymph nodes were hypermetabolic.  She says that she's lost about 10 pounds over the past 2-3 months. She says that she does have occasional night sweats but has been having those since her early 36s when she had a hysterectomy. She is not had any unusual cough or wheezing. She says that her shortness of breath and chest discomfort are exertional, but the amount of exertion necessary varies greatly from day to day.   Past Medical History  Diagnosis Date  . Cancer 02/19/2010    left breast  . Hx of radiation therapy 06/18/10 to 08/01/10    L breast  . Diabetes mellitus   . Hypertension   . Hyperlipidemia   . Dyspnea on exertion   . Chest pain     Past Surgical History  Procedure Laterality Date  . Abdominal hysterectomy      At 53 yrs of age. One ovary removed. Surgery due to fibroids per medical history form dated 02/25/10.  . Breast surgery  03/25/2010    left lumpectomy  . Deep axillary sentinel node biopsy / excision  03/25/2010    4/4 nodes negative  . Portacath  placement  04/18/2010    Dr. Fanny Skates  . Port-a-cath removal  06/05/2011    Procedure: MINOR REMOVAL PORT-A-CATH;  Surgeon: Adin Hector, MD;  Location: [place];  Service: General;  Laterality: N/A;  right    Family History  Problem Relation Age of Onset  . Cancer Mother     Cancer of bladder and brain per medical history form dated 02/25/10.  Marland Kitchen Heart disease Father   . COPD Father   . Aortic stenosis Father     Social History History  Substance Use Topics  . Smoking status: Former Smoker -- 0.10 packs/day for .5 years    Types: Cigarettes    Quit date: 04/20/1977  . Smokeless tobacco: Never Used  . Alcohol Use: No    Current Outpatient Prescriptions  Medication Sig Dispense Refill  . ALPRAZolam (XANAX) 0.25 MG tablet Take 0.25 mg by mouth at bedtime as needed.        . desvenlafaxine (PRISTIQ) 50 MG 24 hr tablet Take 50 mg by mouth daily. Pt takes 100 mg a day.      . fluticasone (FLONASE) 50 MCG/ACT nasal spray Place 2 sprays into both nostrils daily. Pt uses as needed.      . hydrochlorothiazide (HYDRODIURIL) 12.5 MG tablet Take 12.5 mg by mouth daily.      Marland Kitchen levothyroxine (SYNTHROID, LEVOTHROID) 100 MCG tablet Take 100 mcg by mouth daily before breakfast.      .  metFORMIN (GLUCOPHAGE) 500 MG tablet Take 500 mg by mouth 2 (two) times daily with a meal.      . pantoprazole (PROTONIX) 40 MG tablet Take 40 mg by mouth 2 (two) times daily.      Marland Kitchen zolpidem (AMBIEN) 5 MG tablet TAKE 1 TABLET BY MOUTH AT BEDTIME AS NEEDED FOR SLEEP  30 tablet  0   No current facility-administered medications for this visit.    Allergies  Allergen Reactions  . Codeine Itching    Updated per [facility].    Review of Systems  Constitutional: Positive for diaphoresis (has had for many years), activity change and unexpected weight change (lost 10 pounds in 3 months). Negative for fever and chills.  Respiratory: Positive for chest tightness and shortness of breath. Negative  for wheezing.   Gastrointestinal:       Heartburn  Psychiatric/Behavioral: The patient is nervous/anxious.   All other systems reviewed and are negative.   BP 155/94  Pulse 105  Resp 16  Ht 5\' 3"  (1.6 m)  Wt 190 lb (86.183 kg)  BMI 33.67 kg/m2  SpO2 98% Physical Exam  Vitals reviewed. Constitutional: She is oriented to person, place, and time. She appears well-developed and well-nourished. No distress.  HENT:  Head: Normocephalic and atraumatic.  Eyes: EOM are normal. Pupils are equal, round, and reactive to light.  Neck: Neck supple. No thyromegaly present.  Cardiovascular: Normal rate, regular rhythm, normal heart sounds and intact distal pulses.  Exam reveals no gallop.   No murmur heard. Pulmonary/Chest: Effort normal and breath sounds normal. She has no wheezes.  Abdominal: Soft. There is no tenderness.  Musculoskeletal: She exhibits no edema.  Lymphadenopathy:    She has no cervical adenopathy.  Neurological: She is alert and oriented to person, place, and time. No cranial nerve deficit.  No focal motor deficit  Skin: Skin is warm and dry.     Diagnostic Tests: NUCLEAR MEDICINE PET SKULL BASE TO THIGH  TECHNIQUE:  9.3 mCi F-18 FDG was injected intravenously. Full-ring PET imaging  was performed from the skull base to thigh after the radiotracer. CT  data was obtained and used for attenuation correction and anatomic  localization.  FASTING BLOOD GLUCOSE: Value: 108 mg/dl  COMPARISON: Chest CT 12/07/2013.  FINDINGS:  NECK  No hypermetabolic lymph nodes in the neck.  CHEST  Extensive borderline enlarged and enlarged mediastinal and bilateral  hilar lymph nodes demonstrate hypermetabolism. The largest of these  lymph nodes is a 1.9 cm short axis low right paratracheal lymph node  (SUVmax = 5.5). Mediastinal and hilar lymph nodes are diffusely  hypermetabolic ranging from SUVmax of 3.0-5.8. Numerous small  pulmonary nodules again seen scattered throughout the  lungs  bilaterally, largest of which measures 7 mm in the periphery of the  right upper lobe (image 33 of series 7). None of these demonstrate  definite hypermetabolism on the PET portion of the examination. The  previously described 7 x 8 mm nodular opacity in the medial aspect  of the right upper lobe is favored to represent an interlobar lymph  node, and is inseparable from the adjacent right hilar adenopathy on  today's examination. No acute consolidative airspace disease. No  pleural effusions.  ABDOMEN/PELVIS  No abnormal hypermetabolic activity within the liver, pancreas,  adrenal glands, or spleen. No hypermetabolic lymph nodes in the  abdomen or pelvis. Diffuse low attenuation throughout the hepatic  parenchyma, compatible with hepatic steatosis. Some focal fatty  sparing adjacent to the gallbladder fossa.  The unenhanced appearance  of the gallbladder, pancreas, spleen, bilateral adrenal glands and  the left kidney is unremarkable. In the interpolar region of the  right kidney there is a 2 mm calcification which may represent a  nonobstructive calculus or a vascular calcification. Numerous  colonic diverticulae are noted, particularly in the region of the  sigmoid colon, without surrounding inflammatory changes to suggest  an acute diverticulitis at this time. No significant volume of  ascites. No pneumoperitoneum. No pathologic distention of small  bowel. Status post hysterectomy. Ovaries are not confidently  identified may be surgically absent or atrophic. Urinary bladder is  unremarkable in appearance.  SKELETON  No focal hypermetabolic activity to suggest skeletal metastasis.  IMPRESSION:  1. Mediastinal and bilateral hilar lymphadenopathy is most  suspicious for a lymphoproliferative disorder such as lymphoma.  2. There are multiple small pulmonary nodules which appear unchanged  compared to the recent prior examination. These are nonspecific, and  some of them are  similar to remote prior study 04/15/2010,  suggestive of a benign etiology. Continued attention on future  follow-up imaging is recommended.  3. Hepatic steatosis.  4. Colonic diverticulosis without findings to suggest acute  diverticulitis at this time.  Electronically Signed  By: Vinnie Langton M.D.  On: 12/15/2013 08:45   Impression: 58 year old woman with a one-year history of unexplained exertional chest discomfort and shortness of breath. She recently has been found to have a lung nodule and significant hilar and mediastinal adenopathy. The lymph nodes are hypermetabolic by PET.  I reviewed the CT and PET images with Tracy Barnes and her daughters. We discussed the differential diagnosis which includes infectious, inflammatory, and neoplastic etiologies. They understand the most likely etiologies include sarcoidosis or a lymphoproliferative malignancy. Metastatic breast cancer is also within the differential.  I recommended that we proceed with an endobronchial ultrasound and possible mediastinoscopy for diagnostic purposes. We discussed the indications, risks, benefits, and alternatives. She understands the procedure would be done in the operating room under general anesthesia. We would start with EBUS. If that was nondiagnostic we would proceed with mediastinoscopy at the same setting. I reviewed the general nature of the procedure with her. We discussed doing this as an outpatient. She understands the risk include, but are not limited to death, MI, stroke, bleeding, possible need for transfusion, infection, pneumothorax, recurrent nerve injury with resulting hoarseness, esophageal injury, as well as the possibility of unforeseeable complications. She understands and accepts the risks and wishes to proceed.  Plan: EBUS, possible mediastinoscopy on Wednesday, September 9.

## 2013-12-20 ENCOUNTER — Encounter (HOSPITAL_COMMUNITY): Payer: Self-pay | Admitting: Pharmacy Technician

## 2013-12-21 ENCOUNTER — Encounter (HOSPITAL_COMMUNITY): Payer: Self-pay

## 2013-12-21 ENCOUNTER — Encounter (HOSPITAL_COMMUNITY)
Admission: RE | Admit: 2013-12-21 | Discharge: 2013-12-21 | Disposition: A | Discharge status: Home / Self Care | Payer: 59 | Source: Ambulatory Visit | Attending: Thoracic Surgery (Cardiothoracic Vascular Surgery) | Admitting: Thoracic Surgery (Cardiothoracic Vascular Surgery)

## 2013-12-21 VITALS — BP 120/72 | HR 84 | Temp 98.0°F | Resp 18 | Ht 62.0 in | Wt 190.1 lb

## 2013-12-21 DIAGNOSIS — Z01818 Encounter for other preprocedural examination: Secondary | ICD-10-CM | POA: Diagnosis present

## 2013-12-21 DIAGNOSIS — R590 Localized enlarged lymph nodes: Secondary | ICD-10-CM

## 2013-12-21 DIAGNOSIS — R599 Enlarged lymph nodes, unspecified: Secondary | ICD-10-CM | POA: Insufficient documentation

## 2013-12-21 HISTORY — DX: Gastro-esophageal reflux disease without esophagitis: K21.9

## 2013-12-21 LAB — COMPREHENSIVE METABOLIC PANEL
ALK PHOS: 78 U/L (ref 39–117)
ALT: 17 U/L (ref 0–35)
AST: 16 U/L (ref 0–37)
Albumin: 4 g/dL (ref 3.5–5.2)
Anion gap: 17 — ABNORMAL HIGH (ref 5–15)
BUN: 17 mg/dL (ref 6–23)
CO2: 26 mEq/L (ref 19–32)
Calcium: 10.2 mg/dL (ref 8.4–10.5)
Chloride: 99 mEq/L (ref 96–112)
Creatinine, Ser: 0.66 mg/dL (ref 0.50–1.10)
GFR calc non Af Amer: >90 mL/min (ref >90)
Glucose, Bld: 149 mg/dL — ABNORMAL HIGH (ref 70–99)
POTASSIUM: 4.1 meq/L (ref 3.7–5.3)
SODIUM: 142 meq/L (ref 137–147)
TOTAL PROTEIN: 7.3 g/dL (ref 6.0–8.3)
Total Bilirubin: 0.3 mg/dL (ref 0.3–1.2)

## 2013-12-21 LAB — CBC
HEMATOCRIT: 39.2 % (ref 36.0–46.0)
Hemoglobin: 12.8 g/dL (ref 12.0–15.0)
MCH: 23.5 pg — ABNORMAL LOW (ref 26.0–34.0)
MCHC: 32.7 g/dL (ref 30.0–36.0)
MCV: 71.9 fL — ABNORMAL LOW (ref 78.0–100.0)
Platelets: 264 10*3/uL (ref 150–400)
RBC: 5.45 MIL/uL — AB (ref 3.87–5.11)
RDW: 14.6 % (ref 11.5–15.5)
WBC: 5.6 10*3/uL (ref 4.0–10.5)

## 2013-12-21 LAB — TYPE AND SCREEN
ABO/RH(D): A POS
ANTIBODY SCREEN: NEGATIVE

## 2013-12-21 LAB — PROTIME-INR
INR: 0.96 (ref 0.00–1.49)
Prothrombin Time: 12.8 seconds (ref 11.6–15.2)

## 2013-12-21 LAB — APTT: APTT: 31 s (ref 24–37)

## 2013-12-21 LAB — SURGICAL PCR SCREEN
MRSA, PCR: NEGATIVE
STAPHYLOCOCCUS AUREUS: POSITIVE — AB

## 2013-12-21 LAB — ABO/RH: ABO/RH(D): A POS

## 2013-12-21 NOTE — Progress Notes (Signed)
PCP:Dr. Roque Cash  Cardiologist: Dr. Gwenlyn Found

## 2013-12-21 NOTE — Pre-Procedure Instructions (Signed)
Tracy Barnes  12/21/2013   Your procedure is scheduled on: Wednesday, September 9  Report to [facility] Main Entrance "A" at 11:30 AM.  Call this number if you have problems the morning of surgery: [phone]   Remember:   Do not eat food or drink liquids after midnight.   Take these medicines the morning of surgery with A SIP OF WATER: pristiq, flonase if needed, synthroid, protonix   Do not wear jewelry, make-up or nail polish.  Do not wear lotions, powders, or perfumes. You may wear deodorant.  Do not shave 48 hours prior to surgery. Men may shave face and neck.  Do not bring valuables to the hospital.  [facility] is not responsible                  for any belongings or valuables.               Contacts, dentures or bridgework may not be worn into surgery.  Leave suitcase in the car. After surgery it may be brought to your room.  For patients admitted to the hospital, discharge time is determined by your                treatment team.               Patients discharged the day of surgery will not be allowed to drive  home.  Name and phone number of your driver:   Special Instructions: Review Preparing for Surgery Handout   Please read over the following fact sheets that you were given: Pain Booklet, Coughing and Deep Breathing, Blood Transfusion Information, MRSA Information and Surgical Site Infection Prevention

## 2013-12-22 NOTE — Progress Notes (Signed)
Mupirocin ointment Rx called into [facility] for positive PCR of staph. Left message on pt's cell phone notifying her.

## 2013-12-26 MED ORDER — DEXTROSE 5 % IV SOLN
1.5000 g | INTRAVENOUS | Status: AC
  Administered 2013-12-27: 1.5 g via INTRAVENOUS
  Filled 2013-12-26: qty 1.5

## 2013-12-26 NOTE — Progress Notes (Signed)
Patient notified of new arrival time of 10:20. Patient verbalized understanding.

## 2013-12-27 ENCOUNTER — Ambulatory Visit (HOSPITAL_COMMUNITY): Payer: 59 | Admitting: Certified Registered"

## 2013-12-27 ENCOUNTER — Encounter (HOSPITAL_COMMUNITY): Payer: Self-pay | Admitting: Certified Registered"

## 2013-12-27 ENCOUNTER — Encounter (HOSPITAL_COMMUNITY): Payer: 59 | Admitting: Certified Registered"

## 2013-12-27 ENCOUNTER — Encounter (HOSPITAL_COMMUNITY)
Admission: RE | Disposition: A | Discharge status: Home / Self Care | Payer: Self-pay | Source: Ambulatory Visit | Attending: Thoracic Surgery (Cardiothoracic Vascular Surgery)

## 2013-12-27 ENCOUNTER — Ambulatory Visit (HOSPITAL_COMMUNITY): Payer: 59

## 2013-12-27 ENCOUNTER — Ambulatory Visit (HOSPITAL_COMMUNITY)
Admission: RE | Admit: 2013-12-27 | Discharge: 2013-12-27 | Disposition: A | Discharge status: Home / Self Care | Payer: 59 | Source: Ambulatory Visit | Attending: Thoracic Surgery (Cardiothoracic Vascular Surgery) | Admitting: Thoracic Surgery (Cardiothoracic Vascular Surgery)

## 2013-12-27 DIAGNOSIS — E119 Type 2 diabetes mellitus without complications: Secondary | ICD-10-CM | POA: Diagnosis not present

## 2013-12-27 DIAGNOSIS — I881 Chronic lymphadenitis, except mesenteric: Secondary | ICD-10-CM | POA: Diagnosis not present

## 2013-12-27 DIAGNOSIS — E785 Hyperlipidemia, unspecified: Secondary | ICD-10-CM | POA: Diagnosis not present

## 2013-12-27 DIAGNOSIS — Z9079 Acquired absence of other genital organ(s): Secondary | ICD-10-CM | POA: Diagnosis not present

## 2013-12-27 DIAGNOSIS — Z853 Personal history of malignant neoplasm of breast: Secondary | ICD-10-CM | POA: Insufficient documentation

## 2013-12-27 DIAGNOSIS — R599 Enlarged lymph nodes, unspecified: Secondary | ICD-10-CM

## 2013-12-27 DIAGNOSIS — Z923 Personal history of irradiation: Secondary | ICD-10-CM | POA: Insufficient documentation

## 2013-12-27 DIAGNOSIS — R0609 Other forms of dyspnea: Secondary | ICD-10-CM | POA: Diagnosis not present

## 2013-12-27 DIAGNOSIS — R0989 Other specified symptoms and signs involving the circulatory and respiratory systems: Secondary | ICD-10-CM | POA: Insufficient documentation

## 2013-12-27 DIAGNOSIS — I10 Essential (primary) hypertension: Secondary | ICD-10-CM | POA: Insufficient documentation

## 2013-12-27 DIAGNOSIS — R590 Localized enlarged lymph nodes: Secondary | ICD-10-CM

## 2013-12-27 DIAGNOSIS — R12 Heartburn: Secondary | ICD-10-CM | POA: Diagnosis not present

## 2013-12-27 HISTORY — PX: VIDEO BRONCHOSCOPY WITH ENDOBRONCHIAL ULTRASOUND: SHX6177

## 2013-12-27 HISTORY — PX: MEDIASTINOSCOPY: SHX5086

## 2013-12-27 LAB — GLUCOSE, CAPILLARY
GLUCOSE-CAPILLARY: 114 mg/dL — AB (ref 70–99)
Glucose-Capillary: 100 mg/dL — ABNORMAL HIGH (ref 70–99)
Glucose-Capillary: 154 mg/dL — ABNORMAL HIGH (ref 70–99)

## 2013-12-27 SURGERY — BRONCHOSCOPY, WITH EBUS
Anesthesia: General

## 2013-12-27 MED ORDER — PROMETHAZINE HCL 25 MG/ML IJ SOLN
6.2500 mg | INTRAMUSCULAR | Status: DC | PRN
  Administered 2013-12-27: 6.25 mg via INTRAVENOUS

## 2013-12-27 MED ORDER — GLYCOPYRROLATE 0.2 MG/ML IJ SOLN
INTRAMUSCULAR | Status: AC
  Filled 2013-12-27: qty 3

## 2013-12-27 MED ORDER — DEXAMETHASONE SODIUM PHOSPHATE 10 MG/ML IJ SOLN
INTRAMUSCULAR | Status: AC
  Filled 2013-12-27: qty 1

## 2013-12-27 MED ORDER — MIDAZOLAM HCL 5 MG/5ML IJ SOLN
INTRAMUSCULAR | Status: DC | PRN
  Administered 2013-12-27: 2 mg via INTRAVENOUS

## 2013-12-27 MED ORDER — MIDAZOLAM HCL 2 MG/2ML IJ SOLN
INTRAMUSCULAR | Status: AC
  Filled 2013-12-27: qty 2

## 2013-12-27 MED ORDER — FENTANYL CITRATE 0.05 MG/ML IJ SOLN
INTRAMUSCULAR | Status: DC | PRN
  Administered 2013-12-27 (×2): 50 ug via INTRAVENOUS
  Administered 2013-12-27: 100 ug via INTRAVENOUS
  Administered 2013-12-27: 50 ug via INTRAVENOUS

## 2013-12-27 MED ORDER — DEXAMETHASONE SODIUM PHOSPHATE 10 MG/ML IJ SOLN
INTRAMUSCULAR | Status: DC | PRN
  Administered 2013-12-27: 10 mg via INTRAVENOUS

## 2013-12-27 MED ORDER — ARTIFICIAL TEARS OP OINT
TOPICAL_OINTMENT | OPHTHALMIC | Status: DC | PRN
  Administered 2013-12-27: 1 via OPHTHALMIC

## 2013-12-27 MED ORDER — ONDANSETRON HCL 4 MG/2ML IJ SOLN
INTRAMUSCULAR | Status: DC | PRN
  Administered 2013-12-27: 4 mg via INTRAVENOUS

## 2013-12-27 MED ORDER — LIDOCAINE HCL (CARDIAC) 20 MG/ML IV SOLN
INTRAVENOUS | Status: AC
  Filled 2013-12-27: qty 10

## 2013-12-27 MED ORDER — HEMOSTATIC AGENTS (NO CHARGE) OPTIME
TOPICAL | Status: DC | PRN
  Administered 2013-12-27: 1 via TOPICAL

## 2013-12-27 MED ORDER — HYDROMORPHONE HCL PF 1 MG/ML IJ SOLN
0.2500 mg | INTRAMUSCULAR | Status: DC | PRN
  Administered 2013-12-27 (×2): 0.5 mg via INTRAVENOUS

## 2013-12-27 MED ORDER — 0.9 % SODIUM CHLORIDE (POUR BTL) OPTIME
TOPICAL | Status: DC | PRN
  Administered 2013-12-27: 1000 mL

## 2013-12-27 MED ORDER — ROCURONIUM BROMIDE 50 MG/5ML IV SOLN
INTRAVENOUS | Status: AC
  Filled 2013-12-27: qty 1

## 2013-12-27 MED ORDER — PROPOFOL 10 MG/ML IV BOLUS
INTRAVENOUS | Status: AC
  Filled 2013-12-27: qty 20

## 2013-12-27 MED ORDER — LACTATED RINGERS IV SOLN
INTRAVENOUS | Status: DC
  Administered 2013-12-27: 11:00:00 via INTRAVENOUS

## 2013-12-27 MED ORDER — HYDROMORPHONE HCL PF 1 MG/ML IJ SOLN
INTRAMUSCULAR | Status: AC
  Filled 2013-12-27: qty 1

## 2013-12-27 MED ORDER — LACTATED RINGERS IV SOLN
INTRAVENOUS | Status: DC | PRN
  Administered 2013-12-27: 12:00:00 via INTRAVENOUS

## 2013-12-27 MED ORDER — NEOSTIGMINE METHYLSULFATE 10 MG/10ML IV SOLN
INTRAVENOUS | Status: DC | PRN
  Administered 2013-12-27: 4 mg via INTRAVENOUS

## 2013-12-27 MED ORDER — ROCURONIUM BROMIDE 100 MG/10ML IV SOLN
INTRAVENOUS | Status: DC | PRN
  Administered 2013-12-27: 10 mg via INTRAVENOUS
  Administered 2013-12-27: 40 mg via INTRAVENOUS

## 2013-12-27 MED ORDER — CHLORHEXIDINE GLUCONATE 4 % EX LIQD
1.0000 "application " | Freq: Once | CUTANEOUS | Status: DC
  Filled 2013-12-27: qty 15

## 2013-12-27 MED ORDER — LIDOCAINE HCL (CARDIAC) 20 MG/ML IV SOLN
INTRAVENOUS | Status: DC | PRN
  Administered 2013-12-27: 60 mg via INTRAVENOUS

## 2013-12-27 MED ORDER — EPINEPHRINE HCL 1 MG/ML IJ SOLN
INTRAMUSCULAR | Status: AC
  Filled 2013-12-27: qty 1

## 2013-12-27 MED ORDER — GLYCOPYRROLATE 0.2 MG/ML IJ SOLN
INTRAMUSCULAR | Status: DC | PRN
  Administered 2013-12-27: 0.6 mg via INTRAVENOUS

## 2013-12-27 MED ORDER — OXYCODONE HCL 5 MG PO TABS
5.0000 mg | ORAL_TABLET | Freq: Once | ORAL | Status: AC | PRN
  Administered 2013-12-27: 5 mg via ORAL

## 2013-12-27 MED ORDER — OXYCODONE HCL 5 MG PO TABS
ORAL_TABLET | ORAL | Status: AC
  Filled 2013-12-27: qty 1

## 2013-12-27 MED ORDER — TRAMADOL HCL 50 MG PO TABS: 50.0000 mg | ORAL_TABLET | Freq: Four times a day (QID) | ORAL | Status: DC | PRN

## 2013-12-27 MED ORDER — PROPOFOL 10 MG/ML IV BOLUS
INTRAVENOUS | Status: DC | PRN
  Administered 2013-12-27: 200 mg via INTRAVENOUS
  Administered 2013-12-27: 20 mg via INTRAVENOUS
  Administered 2013-12-27: 10 mg via INTRAVENOUS
  Administered 2013-12-27: 40 mg via INTRAVENOUS

## 2013-12-27 MED ORDER — FENTANYL CITRATE 0.05 MG/ML IJ SOLN
INTRAMUSCULAR | Status: AC
  Filled 2013-12-27: qty 5

## 2013-12-27 MED ORDER — ONDANSETRON HCL 4 MG/2ML IJ SOLN
INTRAMUSCULAR | Status: AC
  Filled 2013-12-27: qty 2

## 2013-12-27 MED ORDER — PROMETHAZINE HCL 25 MG/ML IJ SOLN
INTRAMUSCULAR | Status: AC
  Filled 2013-12-27: qty 1

## 2013-12-27 MED ORDER — OXYCODONE HCL 5 MG/5ML PO SOLN: 5.0000 mg | Freq: Once | ORAL | Status: AC | PRN

## 2013-12-27 SURGICAL SUPPLY — 62 items
APPLIER CLIP LOGIC TI 5 (MISCELLANEOUS) IMPLANT
BLADE SURG 15 STRL LF DISP TIS (BLADE) IMPLANT
BLADE SURG 15 STRL SS (BLADE)
BRUSH CYTOL CELLEBRITY 1.5X140 (MISCELLANEOUS) IMPLANT
CANISTER SUCTION 2500CC (MISCELLANEOUS) ×4 IMPLANT
CLIP TI MEDIUM 6 (CLIP) IMPLANT
CONT SPEC 4OZ CLIKSEAL STRL BL (MISCELLANEOUS) ×6 IMPLANT
COTTONBALL LRG STERILE PKG (GAUZE/BANDAGES/DRESSINGS) IMPLANT
COVER SURGICAL LIGHT HANDLE (MISCELLANEOUS) ×4 IMPLANT
COVER TABLE BACK 60X90 (DRAPES) ×2 IMPLANT
DERMABOND ADHESIVE PROPEN (GAUZE/BANDAGES/DRESSINGS) ×1
DERMABOND ADVANCED (GAUZE/BANDAGES/DRESSINGS) ×1
DERMABOND ADVANCED .7 DNX12 (GAUZE/BANDAGES/DRESSINGS) ×1 IMPLANT
DERMABOND ADVANCED .7 DNX6 (GAUZE/BANDAGES/DRESSINGS) ×1 IMPLANT
DRAPE CHEST BREAST 15X10 FENES (DRAPES) ×2 IMPLANT
ELECT REM PT RETURN 9FT ADLT (ELECTROSURGICAL) ×2
ELECTRODE REM PT RTRN 9FT ADLT (ELECTROSURGICAL) ×1 IMPLANT
FILTER STRAW FLUID ASPIR (MISCELLANEOUS) IMPLANT
FORCEPS BIOP RJ4 1.8 (CUTTING FORCEPS) IMPLANT
GAUZE SPONGE 4X4 12PLY STRL (GAUZE/BANDAGES/DRESSINGS) ×2 IMPLANT
GAUZE SPONGE 4X4 16PLY XRAY LF (GAUZE/BANDAGES/DRESSINGS) ×2 IMPLANT
GLOVE SURG SIGNA 7.5 PF LTX (GLOVE) ×4 IMPLANT
GOWN STRL REUS W/ TWL LRG LVL3 (GOWN DISPOSABLE) ×1 IMPLANT
GOWN STRL REUS W/ TWL XL LVL3 (GOWN DISPOSABLE) ×2 IMPLANT
GOWN STRL REUS W/TWL LRG LVL3 (GOWN DISPOSABLE) ×1
GOWN STRL REUS W/TWL XL LVL3 (GOWN DISPOSABLE) ×2
HEMOSTAT SURGICEL 2X14 (HEMOSTASIS) ×2 IMPLANT
KIT BASIN OR (CUSTOM PROCEDURE TRAY) ×2 IMPLANT
KIT ROOM TURNOVER OR (KITS) ×4 IMPLANT
MARKER SKIN DUAL TIP RULER LAB (MISCELLANEOUS) ×2 IMPLANT
NEEDLE 22X1 1/2 (OR ONLY) (NEEDLE) IMPLANT
NEEDLE BIOPSY TRANSBRONCH 21G (NEEDLE) IMPLANT
NEEDLE BLUNT 18X1 FOR OR ONLY (NEEDLE) IMPLANT
NEEDLE SYS SONOTIP II EBUSTBNA (NEEDLE) ×2 IMPLANT
NS IRRIG 1000ML POUR BTL (IV SOLUTION) ×4 IMPLANT
OIL SILICONE PENTAX (PARTS (SERVICE/REPAIRS)) IMPLANT
PACK SURGICAL SETUP 50X90 (CUSTOM PROCEDURE TRAY) ×2 IMPLANT
PAD ARMBOARD 7.5X6 YLW CONV (MISCELLANEOUS) ×8 IMPLANT
PENCIL BUTTON HOLSTER BLD 10FT (ELECTRODE) ×2 IMPLANT
SPONGE INTESTINAL PEANUT (DISPOSABLE) IMPLANT
SUT SILK 2 0 TIES 10X30 (SUTURE) IMPLANT
SUT SILK 3 0 TIES 10X30 (SUTURE) ×2 IMPLANT
SUT VIC AB 2-0 CT1 27 (SUTURE)
SUT VIC AB 2-0 CT1 TAPERPNT 27 (SUTURE) IMPLANT
SUT VIC AB 3-0 SH 18 (SUTURE) ×2 IMPLANT
SUT VIC AB 3-0 SH 27 (SUTURE)
SUT VIC AB 3-0 SH 27X BRD (SUTURE) IMPLANT
SUT VIC AB 3-0 X1 27 (SUTURE) ×2 IMPLANT
SUT VICRYL 4-0 PS2 18IN ABS (SUTURE) IMPLANT
SWAB COLLECTION DEVICE MRSA (MISCELLANEOUS) IMPLANT
SYR 20CC LL (SYRINGE) ×2 IMPLANT
SYR 20ML ECCENTRIC (SYRINGE) ×2 IMPLANT
SYR 5ML LL (SYRINGE) ×2 IMPLANT
SYR 5ML LUER SLIP (SYRINGE) ×2 IMPLANT
SYR CONTROL 10ML LL (SYRINGE) IMPLANT
SYRINGE 10CC LL (SYRINGE) ×2 IMPLANT
TOWEL OR 17X24 6PK STRL BLUE (TOWEL DISPOSABLE) ×4 IMPLANT
TOWEL OR 17X26 10 PK STRL BLUE (TOWEL DISPOSABLE) ×2 IMPLANT
TRAP SPECIMEN MUCOUS 40CC (MISCELLANEOUS) ×2 IMPLANT
TUBE ANAEROBIC SPECIMEN COL (MISCELLANEOUS) IMPLANT
TUBE CONNECTING 12X1/4 (SUCTIONS) ×4 IMPLANT
WATER STERILE IRR 1000ML POUR (IV SOLUTION) ×2 IMPLANT

## 2013-12-27 NOTE — Interval H&P Note (Signed)
History and Physical Interval Note:  12/27/2013 11:36 AM  Tracy Barnes  has presented today for surgery, with the diagnosis of mediastinal adenopathy  The various methods of treatment have been discussed with the patient and family. After consideration of risks, benefits and other options for treatment, the patient has consented to  Procedure(s): VIDEO BRONCHOSCOPY WITH ENDOBRONCHIAL ULTRASOUND (N/A) MEDIASTINOSCOPY, possible (N/A) as a surgical intervention .  The patient's history has been reviewed, patient examined, no change in status, stable for surgery.  I have reviewed the patient's chart and labs.  Questions were answered to the patient's satisfaction.     HENDRICKSON,STEVEN C

## 2013-12-27 NOTE — Anesthesia Procedure Notes (Signed)
Procedure Name: Intubation Date/Time: 12/27/2013 12:27 PM Performed by: Gaylene Brooks Pre-anesthesia Checklist: Patient identified, Timeout performed, Emergency Drugs available, Suction available and Patient being monitored Patient Re-evaluated:Patient Re-evaluated prior to inductionOxygen Delivery Method: Circle system utilized Preoxygenation: Pre-oxygenation with 100% oxygen Intubation Type: IV induction Ventilation: Mask ventilation without difficulty Laryngoscope Size: Miller and 2 Grade View: Grade II Tube type: Oral Tube size: 8.5 mm Number of attempts: 1 Airway Equipment and Method: Stylet Placement Confirmation: ETT inserted through vocal cords under direct vision,  breath sounds checked- equal and bilateral,  positive ETCO2 and CO2 detector Secured at: 23 cm Tube secured with: Tape Dental Injury: Teeth and Oropharynx as per pre-operative assessment

## 2013-12-27 NOTE — Transfer of Care (Signed)
Immediate Anesthesia Transfer of Care Note  Patient: Tracy Barnes  Procedure(s) Performed: Procedure(s): VIDEO BRONCHOSCOPY WITH ENDOBRONCHIAL ULTRASOUND (N/A) MEDIASTINOSCOPY (N/A)  Patient Location: PACU  Anesthesia Type:General  Level of Consciousness: awake, alert  and oriented  Airway & Oxygen Therapy: Patient Spontanous Breathing and Patient connected to nasal cannula oxygen  Post-op Assessment: Report given to PACU RN, Post -op Vital signs reviewed and stable and Patient moving all extremities X 4  Post vital signs: Reviewed and stable  Complications: No apparent anesthesia complications

## 2013-12-27 NOTE — H&P (View-Only) (Signed)
PCP is Sheela Stack, MD Referring Provider is Brand Males, MD  Chief Complaint  Patient presents with  . Shortness of Breath    lung nodule..referred by Dr. Chase Caller..CT CHEST 12/07/12.Marland KitchenPET 12/15/13...PFT'S 12/07/13    HPI: 58 year old woman who presents with chief complaint of chest tightness and shortness of breath for the past year.  Tracy Barnes is a 58 year old woman with a history of diabetes and breast cancer. She had a respiratory infection about a year ago. Since that time she continued to have shortness of breath with exertion associated with chest discomfort. She has had an extensive workup including a normal echocardiogram and a negative stress Myoview. She had full pulmonary function testing which revealed normal lung function. In August she saw Dr. Chase Caller who ordered a CT of the chest. It showed an 8 mm right lung nodule and extensive hilar and mediastinal adenopathy. A PET CT showed the lymph nodes were hypermetabolic.  She says that she's lost about 10 pounds over the past 2-3 months. She says that she does have occasional night sweats but has been having those since her early 30s when she had a hysterectomy. She is not had any unusual cough or wheezing. She says that her shortness of breath and chest discomfort are exertional, but the amount of exertion necessary varies greatly from day to day.   Past Medical History  Diagnosis Date  . Cancer 02/19/2010    left breast  . Hx of radiation therapy 06/18/10 to 08/01/10    L breast  . Diabetes mellitus   . Hypertension   . Hyperlipidemia   . Dyspnea on exertion   . Chest pain     Past Surgical History  Procedure Laterality Date  . Abdominal hysterectomy      At 89 yrs of age. One ovary removed. Surgery due to fibroids per medical history form dated 02/25/10.  . Breast surgery  03/25/2010    left lumpectomy  . Deep axillary sentinel node biopsy / excision  03/25/2010    4/4 nodes negative  . Portacath  placement  04/18/2010    Dr. Fanny Skates  . Port-a-cath removal  06/05/2011    Procedure: MINOR REMOVAL PORT-A-CATH;  Surgeon: Adin Hector, MD;  Location: [place];  Service: General;  Laterality: N/A;  right    Family History  Problem Relation Age of Onset  . Cancer Mother     Cancer of bladder and brain per medical history form dated 02/25/10.  Marland Kitchen Heart disease Father   . COPD Father   . Aortic stenosis Father     Social History History  Substance Use Topics  . Smoking status: Former Smoker -- 0.10 packs/day for .5 years    Types: Cigarettes    Quit date: 04/20/1977  . Smokeless tobacco: Never Used  . Alcohol Use: No    Current Outpatient Prescriptions  Medication Sig Dispense Refill  . ALPRAZolam (XANAX) 0.25 MG tablet Take 0.25 mg by mouth at bedtime as needed.        . desvenlafaxine (PRISTIQ) 50 MG 24 hr tablet Take 50 mg by mouth daily. Pt takes 100 mg a day.      . fluticasone (FLONASE) 50 MCG/ACT nasal spray Place 2 sprays into both nostrils daily. Pt uses as needed.      . hydrochlorothiazide (HYDRODIURIL) 12.5 MG tablet Take 12.5 mg by mouth daily.      Marland Kitchen levothyroxine (SYNTHROID, LEVOTHROID) 100 MCG tablet Take 100 mcg by mouth daily before breakfast.      .  metFORMIN (GLUCOPHAGE) 500 MG tablet Take 500 mg by mouth 2 (two) times daily with a meal.      . pantoprazole (PROTONIX) 40 MG tablet Take 40 mg by mouth 2 (two) times daily.      Marland Kitchen zolpidem (AMBIEN) 5 MG tablet TAKE 1 TABLET BY MOUTH AT BEDTIME AS NEEDED FOR SLEEP  30 tablet  0   No current facility-administered medications for this visit.    Allergies  Allergen Reactions  . Codeine Itching    Updated per [facility].    Review of Systems  Constitutional: Positive for diaphoresis (has had for many years), activity change and unexpected weight change (lost 10 pounds in 3 months). Negative for fever and chills.  Respiratory: Positive for chest tightness and shortness of breath. Negative  for wheezing.   Gastrointestinal:       Heartburn  Psychiatric/Behavioral: The patient is nervous/anxious.   All other systems reviewed and are negative.   BP 155/94  Pulse 105  Resp 16  Ht 5\' 3"  (1.6 m)  Wt 190 lb (86.183 kg)  BMI 33.67 kg/m2  SpO2 98% Physical Exam  Vitals reviewed. Constitutional: She is oriented to person, place, and time. She appears well-developed and well-nourished. No distress.  HENT:  Head: Normocephalic and atraumatic.  Eyes: EOM are normal. Pupils are equal, round, and reactive to light.  Neck: Neck supple. No thyromegaly present.  Cardiovascular: Normal rate, regular rhythm, normal heart sounds and intact distal pulses.  Exam reveals no gallop.   No murmur heard. Pulmonary/Chest: Effort normal and breath sounds normal. She has no wheezes.  Abdominal: Soft. There is no tenderness.  Musculoskeletal: She exhibits no edema.  Lymphadenopathy:    She has no cervical adenopathy.  Neurological: She is alert and oriented to person, place, and time. No cranial nerve deficit.  No focal motor deficit  Skin: Skin is warm and dry.     Diagnostic Tests: NUCLEAR MEDICINE PET SKULL BASE TO THIGH  TECHNIQUE:  9.3 mCi F-18 FDG was injected intravenously. Full-ring PET imaging  was performed from the skull base to thigh after the radiotracer. CT  data was obtained and used for attenuation correction and anatomic  localization.  FASTING BLOOD GLUCOSE: Value: 108 mg/dl  COMPARISON: Chest CT 12/07/2013.  FINDINGS:  NECK  No hypermetabolic lymph nodes in the neck.  CHEST  Extensive borderline enlarged and enlarged mediastinal and bilateral  hilar lymph nodes demonstrate hypermetabolism. The largest of these  lymph nodes is a 1.9 cm short axis low right paratracheal lymph node  (SUVmax = 5.5). Mediastinal and hilar lymph nodes are diffusely  hypermetabolic ranging from SUVmax of 3.0-5.8. Numerous small  pulmonary nodules again seen scattered throughout the  lungs  bilaterally, largest of which measures 7 mm in the periphery of the  right upper lobe (image 33 of series 7). None of these demonstrate  definite hypermetabolism on the PET portion of the examination. The  previously described 7 x 8 mm nodular opacity in the medial aspect  of the right upper lobe is favored to represent an interlobar lymph  node, and is inseparable from the adjacent right hilar adenopathy on  today's examination. No acute consolidative airspace disease. No  pleural effusions.  ABDOMEN/PELVIS  No abnormal hypermetabolic activity within the liver, pancreas,  adrenal glands, or spleen. No hypermetabolic lymph nodes in the  abdomen or pelvis. Diffuse low attenuation throughout the hepatic  parenchyma, compatible with hepatic steatosis. Some focal fatty  sparing adjacent to the gallbladder fossa.  The unenhanced appearance  of the gallbladder, pancreas, spleen, bilateral adrenal glands and  the left kidney is unremarkable. In the interpolar region of the  right kidney there is a 2 mm calcification which may represent a  nonobstructive calculus or a vascular calcification. Numerous  colonic diverticulae are noted, particularly in the region of the  sigmoid colon, without surrounding inflammatory changes to suggest  an acute diverticulitis at this time. No significant volume of  ascites. No pneumoperitoneum. No pathologic distention of small  bowel. Status post hysterectomy. Ovaries are not confidently  identified may be surgically absent or atrophic. Urinary bladder is  unremarkable in appearance.  SKELETON  No focal hypermetabolic activity to suggest skeletal metastasis.  IMPRESSION:  1. Mediastinal and bilateral hilar lymphadenopathy is most  suspicious for a lymphoproliferative disorder such as lymphoma.  2. There are multiple small pulmonary nodules which appear unchanged  compared to the recent prior examination. These are nonspecific, and  some of them are  similar to remote prior study 04/15/2010,  suggestive of a benign etiology. Continued attention on future  follow-up imaging is recommended.  3. Hepatic steatosis.  4. Colonic diverticulosis without findings to suggest acute  diverticulitis at this time.  Electronically Signed  By: Vinnie Langton M.D.  On: 12/15/2013 08:45   Impression: 58 year old woman with a one-year history of unexplained exertional chest discomfort and shortness of breath. She recently has been found to have a lung nodule and significant hilar and mediastinal adenopathy. The lymph nodes are hypermetabolic by PET.  I reviewed the CT and PET images with Tracy Barnes and her daughters. We discussed the differential diagnosis which includes infectious, inflammatory, and neoplastic etiologies. They understand the most likely etiologies include sarcoidosis or a lymphoproliferative malignancy. Metastatic breast cancer is also within the differential.  I recommended that we proceed with an endobronchial ultrasound and possible mediastinoscopy for diagnostic purposes. We discussed the indications, risks, benefits, and alternatives. She understands the procedure would be done in the operating room under general anesthesia. We would start with EBUS. If that was nondiagnostic we would proceed with mediastinoscopy at the same setting. I reviewed the general nature of the procedure with her. We discussed doing this as an outpatient. She understands the risk include, but are not limited to death, MI, stroke, bleeding, possible need for transfusion, infection, pneumothorax, recurrent nerve injury with resulting hoarseness, esophageal injury, as well as the possibility of unforeseeable complications. She understands and accepts the risks and wishes to proceed.  Plan: EBUS, possible mediastinoscopy on Wednesday, September 9.

## 2013-12-27 NOTE — Anesthesia Preprocedure Evaluation (Addendum)
Anesthesia Evaluation  Patient identified by MRN, date of birth, ID band Patient awake    Reviewed: Allergy & Precautions, H&P , NPO status , Patient's Chart, lab work & pertinent test results  Airway Mallampati: II TM Distance: >3 FB Neck ROM: Full    Dental  (+) Teeth Intact, Dental Advisory Given   Pulmonary former smoker,  breath sounds clear to auscultation        Cardiovascular hypertension, Rhythm:Regular Rate:Normal  6/15 TTE: EF 55-60%. Valves normal.   Neuro/Psych negative neurological ROS  negative psych ROS   GI/Hepatic Neg liver ROS, GERD-  ,  Endo/Other  diabetes, Type 2, Oral Hypoglycemic AgentsMorbid obesity  Renal/GU negative Renal ROS     Musculoskeletal negative musculoskeletal ROS (+)   Abdominal   Peds  Hematology negative hematology ROS (+)   Anesthesia Other Findings   Reproductive/Obstetrics negative OB ROS                         Anesthesia Physical Anesthesia Plan  ASA: III  Anesthesia Plan: General   Post-op Pain Management:    Induction: Intravenous  Airway Management Planned: Oral ETT  Additional Equipment:   Intra-op Plan:   Post-operative Plan: Extubation in OR  Informed Consent: I have reviewed the patients History and Physical, chart, labs and discussed the procedure including the risks, benefits and alternatives for the proposed anesthesia with the patient or authorized representative who has indicated his/her understanding and acceptance.   Dental advisory given  Plan Discussed with: CRNA, Anesthesiologist and Surgeon  Anesthesia Plan Comments:         Anesthesia Quick Evaluation

## 2013-12-27 NOTE — Brief Op Note (Signed)
12/27/2013  2:18 PM  PATIENT:  Tracy Barnes  58 y.o. female  PRE-OPERATIVE DIAGNOSIS:  mediastinal adenopathy  POST-OPERATIVE DIAGNOSIS:  Sarcoidosis  PROCEDURE:  Procedure(s): VIDEO BRONCHOSCOPY WITH ENDOBRONCHIAL ULTRASOUND (N/A) MEDIASTINOSCOPY (N/A)  SURGEON:  Surgeon(s) and Role:    * Melrose Nakayama, MD - Primary   ANESTHESIA:   general  EBL:  Total I/O In: 800 [I.V.:800] Out: -   BLOOD ADMINISTERED:none  DRAINS: none   LOCAL MEDICATIONS USED:  NONE  SPECIMEN:  Source of Specimen:  4R and 7 lymph nodes  DISPOSITION OF SPECIMEN:  PATH and MICRO  COUNTS:  YES  PLAN OF CARE: Discharge to home after PACU  PATIENT DISPOSITION:  PACU - hemodynamically stable.   Delay start of Pharmacological VTE agent (>24hrs) due to surgical blood loss or risk of bleeding: not applicable  FINDINGS: noncaseating granulomas

## 2013-12-27 NOTE — Op Note (Signed)
Tracy Barnes, Tracy Barnes              ACCOUNT NO.:  [id number]  MEDICAL RECORD NO.:  [id number]  LOCATION:  [facility]                         FACILITY:  [facility]  PHYSICIAN:  Revonda Standard. Roxan Hockey, M.D.DATE OF BIRTH:  04-15-1956  DATE OF PROCEDURE:  12/27/2013 DATE OF DISCHARGE:  12/27/2013                              OPERATIVE REPORT   PREOPERATIVE DIAGNOSIS:  Mediastinal and hilar adenopathy.  POSTOPERATIVE DIAGNOSIS:  Mediastinal and hilar adenopathy, likely sarcoidosis.  PROCEDURE:   Bronchoscopy Endobronchial ultrasound Mediastinoscopy.  SURGEON:  Revonda Standard. Roxan Hockey, MD  ASSISTANT:  None.  ANESTHESIA:  General.  FINDINGS:   Bronchoscopy- normal endobronchial anatomy and no endobronchial lesions.  Endobronchial ultrasound showed enlarged level 4R and 7 nodes, possible granulomas on initial quick prep, but no definitive diagnosis could be made.    Mediastinoscopy- enlarged 4R node that was pink, granular, and friable.  Frozen section revealed non-caseating granulomas consistent with sarcoidosis.  CLINICAL NOTE:  Tracy Barnes is a 58 year old woman with a chief complaint of chest tightness and shortness of breath over the past year. She had an extensive workup which has failed to reveal evidence of COPD or a cardiac source.  A CT did show a lung nodule and extensive hilar and mediastinal adenopathy.  A PET-CT showed the lymph nodes were hypermetabolic.  She was advised to undergo endobronchial ultrasound and possible mediastinoscopy for diagnostic purposes.  The indications, risks, benefits, and alternatives were discussed in detail with the patient.  She understood and accepted the risks and agreed to proceed.  OPERATIVE NOTE:  Tracy Barnes was brought to the operating room on December 27, 2013.  Intravenous antibiotics were administered.  She was anesthetized and intubated.  Sequential compression devices were placed on the calves for DVT prophylaxis. Flexible  fiberoptic bronchoscopy was performed via the endotracheal tube.  It revealed normal endobronchial anatomy and no endobronchial lesions.  The endobronchial ultrasound probe was placed and there were easily visible 4R node and level 7 nodes.  Multiple aspirations were obtained from each node, a portion of which were sent for quick prep.  The remainder was sent for permanent cell block.  There were enlarged hilar nodes also, but there was not a good windows for aspiration with the ultrasound probe.  The quick prep subsequently returned showing possible granulomas on the 4R node, but it was not felt to be diagnostic.  The decision was made to proceed with mediastinoscopy as discussed preoperatively.  The chest and neck were prepped and draped in the usual sterile fashion. A transverse incision was made just above the sternal notch.  It was carried through the skin and subcutaneous tissue.  Hemostasis was achieved with electrocautery.  The strap muscles were separated in the midline.  The pretracheal fascia was identified and incised and the pretracheal plane was developed bluntly into the mediastinum. Mediastinoscope was inserted and the paratracheal nodes were inspected. There was an enlarged 4R node that was easily visible.  The node was enlarged, pink in color, granular in texture and friable.  Multiple pieces were obtained and these were sent for frozen section.  Additional pieces of the node were taken and sent for AFB and fungal cultures. Finally multiple additional biopsies  were obtained and sent for permanent section.  The area was packed with Surgicel.  The mediastinoscope was removed and the wound was packed with a gauze sponge.  After 5 minutes, the packing was removed.  The mediastinoscope was inserted.  There was good hemostasis.  The mediastinoscope was withdrawn.  The incision was closed in 2 layers with an interrupted 3-0 Vicryl subcutaneous closure and a 4-0 Vicryl  subcuticular closure. Dermabond was applied to the skin.    Frozen section returned showing noncaseating granulomas consistent with sarcoidosis.    The patient was extubated in the operating room and taken to the postanesthetic care unit in good condition.     Revonda Standard Roxan Hockey, M.D.     SCH/MEDQ  D:  12/27/2013  T:  12/27/2013  Job:  [id number]

## 2013-12-27 NOTE — Discharge Instructions (Addendum)
Do not drive or engage in heavy physical activity for 1 week  You may shower tomorrow  You have a prescription for tramadol- a mild narcotic pain medication- you may use as directed. You may use acetaminophen or ibuprofen in addition to, or instead of the tramadol.  There is a medical adhesive on your incision. It will begin to peel off in 7- 10 days  Call [phone] for a follow up appointment in 2 weeks  Call [phone] if you develop chest pain, shortness of breath, fever > 101 F, redness or drainage from the incision  What to eat:  For your first meals, you should eat lightly; only small meals initially.  If you do not have nausea, you may eat larger meals.  Avoid spicy, greasy and heavy food.    General Anesthesia, Adult, Care After  Refer to this sheet in the next few weeks. These instructions provide you with information on caring for yourself after your procedure. Your health care provider may also give you more specific instructions. Your treatment has been planned according to current medical practices, but problems sometimes occur. Call your health care provider if you have any problems or questions after your procedure.  WHAT TO EXPECT AFTER THE PROCEDURE  After the procedure, it is typical to experience:  Sleepiness.  Nausea and vomiting. HOME CARE INSTRUCTIONS  For the first 24 hours after general anesthesia:  Have a responsible person with you.  Do not drive a car. If you are alone, do not take public transportation.  Do not drink alcohol.  Do not take medicine that has not been prescribed by your health care provider.  Do not sign important papers or make important decisions.  You may resume a normal diet and activities as directed by your health care provider.  Change bandages (dressings) as directed.  If you have questions or problems that seem related to general anesthesia, call the hospital and ask for the anesthetist or anesthesiologist on call. SEEK MEDICAL CARE IF:    You have nausea and vomiting that continue the day after anesthesia.  You develop a rash. SEEK IMMEDIATE MEDICAL CARE IF:  You have difficulty breathing.  You have chest pain.  You have any allergic problems. Document Released: 07/13/2000 Document Revised: 12/07/2012 Document Reviewed: 10/20/2012  [facility] Patient Information 2014 [place], [place].

## 2013-12-27 NOTE — Anesthesia Postprocedure Evaluation (Signed)
  Anesthesia Post-op Note  Patient: Tracy Barnes  Procedure(s) Performed: Procedure(s): VIDEO BRONCHOSCOPY WITH ENDOBRONCHIAL ULTRASOUND (N/A) MEDIASTINOSCOPY (N/A)  Patient Location: PACU  Anesthesia Type:General  Level of Consciousness: awake, alert  and oriented  Airway and Oxygen Therapy: Patient Spontanous Breathing  Post-op Pain: none  Post-op Assessment: Post-op Vital signs reviewed  Post-op Vital Signs: Reviewed  Last Vitals:  Filed Vitals:   12/27/13 1600  BP: 158/73  Pulse: 66  Temp:   Resp: 15    Complications: No apparent anesthesia complications

## 2013-12-28 ENCOUNTER — Ambulatory Visit: Payer: 59 | Admitting: Internal Medicine

## 2013-12-28 ENCOUNTER — Encounter (HOSPITAL_COMMUNITY): Payer: Self-pay | Admitting: Thoracic Surgery (Cardiothoracic Vascular Surgery)

## 2013-12-29 ENCOUNTER — Telehealth: Payer: Self-pay | Admitting: Internal Medicine

## 2013-12-29 NOTE — Telephone Encounter (Signed)
Called # provided and was advised had wrong #. Called pt home #  Spoke with pt. She r/s her appt sooner. Nothing frther needed

## 2013-12-31 LAB — TISSUE CULTURE: CULTURE: NO GROWTH

## 2014-01-08 ENCOUNTER — Encounter: Payer: Self-pay | Admitting: Internal Medicine

## 2014-01-08 ENCOUNTER — Ambulatory Visit (INDEPENDENT_AMBULATORY_CARE_PROVIDER_SITE_OTHER): Payer: 59 | Admitting: Internal Medicine

## 2014-01-08 ENCOUNTER — Other Ambulatory Visit: Payer: 59

## 2014-01-08 VITALS — BP 138/80 | HR 85 | Ht 63.0 in | Wt 191.0 lb

## 2014-01-08 DIAGNOSIS — R0600 Dyspnea, unspecified: Secondary | ICD-10-CM | POA: Insufficient documentation

## 2014-01-08 DIAGNOSIS — R590 Localized enlarged lymph nodes: Secondary | ICD-10-CM

## 2014-01-08 DIAGNOSIS — R911 Solitary pulmonary nodule: Secondary | ICD-10-CM | POA: Insufficient documentation

## 2014-01-08 DIAGNOSIS — C50919 Malignant neoplasm of unspecified site of unspecified female breast: Secondary | ICD-10-CM

## 2014-01-08 LAB — RHEUMATOID FACTOR: Rhuematoid fact SerPl-aCnc: <10 IU/mL (ref <=14)

## 2014-01-08 NOTE — Patient Instructions (Addendum)
#  Shortness of breath  - unclear why and if related to mediastinal granuloma nodes - do CPST bike test at [place]  #Lung nodule Right upper lobe Aug 2015 - 74mm  - repeat ct chest wo contrast in 9 months from aug 2015   #Mediastinal granuloma  - discussed range of possibilities from Sarcoid, TB, Soil Fungii like Histoplasma and Rheumatoid Arthritis, ANCA vasculitis  - of these sarcoid most likely - stage 1 -do blood test for ACE, ANA, DS-DNA, RF, CCP, ANCA screen and ANCA titer; will call with results - do Quantiferon gold blood test   #Followup  -affter CPST test; in 3-4 weeks

## 2014-01-08 NOTE — Progress Notes (Signed)
Subjective:    Patient ID: JEANMARIE MCCOWEN, female    DOB: October 25, 1955, 58 y.o.   MRN: [id number]  HPI  PCP MALONEY,NANCY, MD   HPI  IOV 12/04/2013  Chief Complaint  Patient presents with  . Pulmonary Consult    Referred by Tona Sensing, NP for DOE x October 10331    58 year old obese female referred for dyspnea evaluation. In 2010 and had breast cancer and then was on Herceptin. During this time she did have some dyspnea and possibly some cough but this resolved by the time treatment ended in 2011. She was doing completely well to October 2014 when she suffered a cold and since then has had dyspnea on exertion that is associated with significant chest tightness in the upper part of the chest radiating to the neck. Symptoms are relieved only by rest. 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 There is no associated radiation to the back or to the arm or associated wheezing or cough or edema or syncope or paroxysmal nocturnal dyspnea or orthopnea. Symptoms are progressive and out of moderate intensity. Cardiac evaluation is  noncontributory. She has not tried any therapeutic inhalers or pulmonary rehabilitation for relief of dyspnea  She says that she's lost about 10 pounds over the past 2-3 months. She says that she does have occasional night sweats but has been having those since her early 28s when she had a hysterectomy. She is not had any unusual cough or wheezing. She says that her shortness of breath and chest discomfort are exertional, but the amount of exertion necessary varies greatly from day to day.   Expo hx and dyspnea relevant hx   CT chest 04/15/2010 - clear except for 79mm RML nodule non-calcified   1. Let her know PFT 12/07/13 normal (for my review: some flow volume loop variation +)  2/. CT chest 12/07/13: There is some pulmonary nodules small < 66mm that in right lower lobe - new since 2011 and  also new mediastinal node - new since 2011. To better understand this she needs PET Scan; please order. Let her know I cleared the PET scan with Dr Jana Hakim in the oncology depet   PET scan end oct 2015:  ymph nodes is a 1.9 cm short axis low right paratracheal lymph node (SUVmax = 5.5). Mediastinal and hilar lymph nodes are diffusely hypermetabolic ranging from SUVmax of 3.0-5.8. Numerous small pulmonary nodules again seen scattered throughout the lungs bilaterally, largest of which measures 7 mm in the periphery of the right upper lobe (image 33 of series 7). None of these demonstrate definite hypermetabolism on the PET portion of the examination. The previously described 7 x 8 mm nodular opacity in the medial aspect of the right upper lobe is favored to represent an interlobar lymph node, and is inseparable from the adjacent right hilar adenopathy on today's examination. No acute consolidative airspace disease. No pleural effusions   OV 01/08/2014  Chief Complaint  Patient presents with  . Follow-up    Pt here after bronch, PET and CT. Pt states her SOB is not any better. Pt c/o DOE, throat clearing and CP when SOB.    FU    dyspnea - still persists despite EBUS bx procedure. Took 4h to do house hold woirk yesterday. REleived by rest. Not any worse. FEels associated chest tightness   lung nodule - in 2011 had 27mm RML nodule - radiology now calling  this node. But Aug 2015 CT has 56mm RLL Nodules new 0- not seen on PET    MEdiastinal nodes - EBUS and med on 12/27/13 shows granuloma of mediastinum without cause,. New finding since 2011 CT chest. DEnies TB exposure, Collagen vasc dz hx or vasculitis hx. Denies travel to soil fungii states. No prior dx of sarcoid.    Review of Systems  Constitutional: Negative for fever and unexpected weight change.  HENT: Negative for congestion, dental problem, ear pain, nosebleeds, postnasal drip, rhinorrhea, sinus pressure, sneezing, sore throat and trouble  swallowing.   Eyes: Negative for redness and itching.  Respiratory: Positive for shortness of breath. Negative for cough, chest tightness and wheezing.   Cardiovascular: Negative for palpitations and leg swelling.  Gastrointestinal: Negative for nausea and vomiting.  Genitourinary: Negative for dysuria.  Musculoskeletal: Negative for joint swelling.  Skin: Negative for rash.  Neurological: Negative for headaches.  Hematological: Does not bruise/bleed easily.  Psychiatric/Behavioral: Negative for dysphoric mood. The patient is not nervous/anxious.        Objective:   Physical Exam  Filed Vitals:   01/08/14 1342  BP: 138/80  Pulse: 85  Height: 5\' 3"  (1.6 m)  Weight: 191 lb (86.637 kg)  SpO2: 98%    Discussion only      Assessment & Plan:  #Shortness of breath  - unclear why and if related to mediastinal granuloma nodes - do CPST bike test at [place]  #Lung nodule Right upper lobe Aug 2015 - 71mm  - repeat ct chest wo contrast in 9 months from aug 2015   #Mediastinal granuloma  - discussed range of possibilities from Sarcoid, TB, Soil Fungii like Histoplasma and Rheumatoid Arthritis, ANCA vasculitis  - of these sarcoid most likely - stage 1 -do blood test for ACE, ANA, DS-DNA, RF, CCP, ANCA screen and ANCA titer; will call with results - do Quantiferon gold blood test   #Followup  -affter CPST test; in 3-4 wee   > 50% of this > 25 min visit spent in face to face counseling

## 2014-01-09 ENCOUNTER — Ambulatory Visit (INDEPENDENT_AMBULATORY_CARE_PROVIDER_SITE_OTHER): Payer: 59 | Admitting: Thoracic Surgery (Cardiothoracic Vascular Surgery)

## 2014-01-09 ENCOUNTER — Encounter: Payer: Self-pay | Admitting: Thoracic Surgery (Cardiothoracic Vascular Surgery)

## 2014-01-09 VITALS — BP 140/90 | HR 83 | Resp 16 | Ht 63.0 in | Wt 190.0 lb

## 2014-01-09 DIAGNOSIS — D71 Functional disorders of polymorphonuclear neutrophils: Secondary | ICD-10-CM

## 2014-01-09 LAB — ANGIOTENSIN CONVERTING ENZYME: Angiotensin-Converting Enzyme: 46 U/L (ref 8–52)

## 2014-01-09 LAB — ANCA SCREEN W REFLEX TITER
Atypical p-ANCA Screen: NEGATIVE
c-ANCA Screen: NEGATIVE
p-ANCA Screen: NEGATIVE

## 2014-01-09 LAB — ANA: ANA: NEGATIVE

## 2014-01-09 LAB — ANTI-DNA ANTIBODY, DOUBLE-STRANDED

## 2014-01-09 LAB — CYCLIC CITRUL PEPTIDE ANTIBODY, IGG

## 2014-01-09 NOTE — Progress Notes (Signed)
  HPI:  Mrs. Howeth returns for a scheduled postop follow up visit  She had mediastinoscopy on 12/27/2013   Pathology c/w sarcoid  She is not requiring any pain medication. Her baseline shortness of breath is unchanged  She saw Dr. Chase Caller yesterday and a battery of tests was ordered.  Current Outpatient Prescriptions  Medication Sig Dispense Refill  . ALPRAZolam (XANAX) 0.25 MG tablet Take 0.25 mg by mouth at bedtime as needed.        . desvenlafaxine (PRISTIQ) 100 MG 24 hr tablet Take 100 mg by mouth daily.      . fluticasone (FLONASE) 50 MCG/ACT nasal spray Place 2 sprays into both nostrils daily. Pt uses as needed.      . hydrochlorothiazide (HYDRODIURIL) 12.5 MG tablet Take 12.5 mg by mouth daily.      Marland Kitchen levothyroxine (SYNTHROID, LEVOTHROID) 100 MCG tablet Take 100 mcg by mouth daily before breakfast.      . metFORMIN (GLUCOPHAGE) 500 MG tablet Take 500 mg by mouth 2 (two) times daily with a meal.      . pantoprazole (PROTONIX) 40 MG tablet Take 40 mg by mouth 2 (two) times daily.      Marland Kitchen zolpidem (AMBIEN) 5 MG tablet TAKE 1 TABLET BY MOUTH AT BEDTIME AS NEEDED FOR SLEEP  30 tablet  0   No current facility-administered medications for this visit.    Physical Exam BP 140/90  Pulse 83  Resp 16  Ht 5\' 3"  (1.6 m)  Wt 190 lb (86.183 kg)  BMI 33.67 kg/m2  SpO2 98% 58 yo woman in NAD Wound clean, dry and intact  Diagnostic Tests: Lymph node, biopsy, 4R #2 - NONCASEATING GRANULOMATOUS INFLAMMATION. - THERE IS NO EVIDENCE OF MALIGNANCY. - SEE COMMENT. Microscopic Comment  Impression: 58 year old woman with shortness of breath and hilar adenopathy. She has noncaseating granulomas consistent with sarcoidosis. Dr. Chase Caller has sent off a battery of tests to make sure that there is not an underlying infectious cause.   Plan: Followup with Dr. Forde Dandy and Dr. Chase Caller.  I'll be happy to see her back any time if I can be of any further cysts were

## 2014-01-10 ENCOUNTER — Other Ambulatory Visit: Payer: Self-pay | Admitting: Nurse Practitioner

## 2014-01-10 ENCOUNTER — Telehealth: Payer: Self-pay | Admitting: Internal Medicine

## 2014-01-10 DIAGNOSIS — C50919 Malignant neoplasm of unspecified site of unspecified female breast: Secondary | ICD-10-CM

## 2014-01-10 LAB — QUANTIFERON TB GOLD ASSAY (BLOOD)
INTERFERON GAMMA RELEASE ASSAY: NEGATIVE
Mitogen value: 7.48 IU/mL
QUANTIFERON NIL VALUE: 0.04 [IU]/mL
Quantiferon Tb Ag Minus Nil Value: 0 IU/mL
TB AG VALUE: 0.04 [IU]/mL

## 2014-01-10 NOTE — Telephone Encounter (Signed)
Let her know ACE blood test for sarcoid., TB blood test and autoimmune blood tests are aall normal. She is to return after CPST  Thanks  Dr. Brand Males, M.D., [facility].C.P Pulmonary and Critical Care Medicine Staff Physician [facility] Pager: [phone], If no answer or between  15:00h - 7:00h: call [phone]  01/10/2014 5:55 PM

## 2014-01-12 NOTE — Telephone Encounter (Signed)
Called and spoke to pt. Informed pt of results and recs per MR. Pt verbalized understanding and denied any further questions or concerns at this time.

## 2014-01-16 ENCOUNTER — Ambulatory Visit: Payer: 59 | Admitting: Internal Medicine

## 2014-01-17 ENCOUNTER — Other Ambulatory Visit: Payer: 59

## 2014-01-22 LAB — FUNGUS CULTURE W SMEAR: Fungal Smear: NONE SEEN

## 2014-01-24 ENCOUNTER — Ambulatory Visit: Payer: 59 | Admitting: Hematology and Oncology

## 2014-02-01 ENCOUNTER — Other Ambulatory Visit: Payer: Self-pay | Admitting: Endocrinology

## 2014-02-01 ENCOUNTER — Other Ambulatory Visit: Payer: Self-pay | Admitting: Adult Health

## 2014-02-01 DIAGNOSIS — Z9889 Other specified postprocedural states: Secondary | ICD-10-CM

## 2014-02-01 DIAGNOSIS — Z853 Personal history of malignant neoplasm of breast: Secondary | ICD-10-CM

## 2014-02-02 ENCOUNTER — Telehealth: Payer: Self-pay | Admitting: Cardiovascular Disease

## 2014-02-02 ENCOUNTER — Telehealth: Payer: Self-pay | Admitting: Internal Medicine

## 2014-02-02 NOTE — Telephone Encounter (Signed)
Spoke with the pt and notified of recs per MR  She verbalized understanding  Nothing further needed 

## 2014-02-02 NOTE — Telephone Encounter (Signed)
She should go to ER to see Damita Lack is going on ? PE, ? MI, ? Wheezing. They have to evaluate. Tough to evalute this on Friday in the office. Post ER if they discjarge her she should come and see me or TP next week so things are ok for CPST

## 2014-02-02 NOTE — Telephone Encounter (Signed)
Pt called in stating that test were ran by Dr. Chase Caller and he diagnosed her with Sarcoidosis due to her inflamed lymph nodes. He also stated that there was no need for her to be on steroids to help with her breathing. She would like for Dr. Gwenlyn Found to review her results from the tests that were done on her to see of she needs to come in and be seen by Dr. Gwenlyn Found. She states that her SOB has not improved. Please call  thanks

## 2014-02-02 NOTE — Telephone Encounter (Signed)
Called spoke with patient who c/o increased DOE and chest tightness x10-14 days and reports that she often feels like she is going to "pass out."  Pt denies any increased cough, wheezing, f/c/s, n/v/d, hemoptysis, chest congestion.  She is scheduled for CPST on 10.30.15 but is concerned if she will be able to perform.  MR please advise, thank you. Per the 9.21.15 ov: Patient Instructions      #Shortness of breath  - unclear why and if related to mediastinal granuloma nodes - do CPST bike test at [place]  #Lung nodule Right upper lobe Aug 2015 - 21mm   - repeat ct chest wo contrast in 9 months from aug 2015   #Mediastinal granuloma  - discussed range of possibilities from Sarcoid, TB, Soil Fungii like Histoplasma and Rheumatoid Arthritis, ANCA vasculitis  - of these sarcoid most likely - stage 1 -do blood test for ACE, ANA, DS-DNA, RF, CCP, ANCA screen and ANCA titer; will call with results - do Quantiferon gold blood test   #Followup  -affter CPST test; in 3-4 weeks

## 2014-02-02 NOTE — Telephone Encounter (Signed)
Dr Berry, please advise 

## 2014-02-08 LAB — AFB CULTURE WITH SMEAR (NOT AT ARMC): Acid Fast Smear: NONE SEEN

## 2014-02-09 NOTE — Telephone Encounter (Signed)
Her cardiology workup was unrevealing and she now has a diagnosis of sarcoidosis explaining her shortness of breath.

## 2014-02-09 NOTE — Telephone Encounter (Signed)
I spoke with patient and explained that Dr Gwenlyn Found did not feel that there is a cardiac component to her SOB. Patient verbalized understanding.

## 2014-02-13 ENCOUNTER — Ambulatory Visit
Admission: RE | Admit: 2014-02-13 | Discharge: 2014-02-13 | Disposition: A | Discharge status: Home / Self Care | Payer: 59 | Source: Ambulatory Visit | Attending: Adult Health | Admitting: Adult Health

## 2014-02-13 DIAGNOSIS — Z853 Personal history of malignant neoplasm of breast: Secondary | ICD-10-CM

## 2014-02-13 DIAGNOSIS — Z9889 Other specified postprocedural states: Secondary | ICD-10-CM

## 2014-02-16 ENCOUNTER — Ambulatory Visit (HOSPITAL_COMMUNITY): Payer: 59 | Attending: Internal Medicine

## 2014-02-16 DIAGNOSIS — R0600 Dyspnea, unspecified: Secondary | ICD-10-CM | POA: Diagnosis present

## 2014-02-16 DIAGNOSIS — Z853 Personal history of malignant neoplasm of breast: Secondary | ICD-10-CM | POA: Diagnosis not present

## 2014-02-16 DIAGNOSIS — R599 Enlarged lymph nodes, unspecified: Secondary | ICD-10-CM | POA: Insufficient documentation

## 2014-02-16 DIAGNOSIS — R079 Chest pain, unspecified: Secondary | ICD-10-CM | POA: Diagnosis not present

## 2014-02-16 DIAGNOSIS — Z923 Personal history of irradiation: Secondary | ICD-10-CM | POA: Insufficient documentation

## 2014-02-16 DIAGNOSIS — Z780 Asymptomatic menopausal state: Secondary | ICD-10-CM | POA: Insufficient documentation

## 2014-02-16 DIAGNOSIS — R911 Solitary pulmonary nodule: Secondary | ICD-10-CM | POA: Diagnosis not present

## 2014-02-26 ENCOUNTER — Encounter: Payer: Self-pay | Admitting: Internal Medicine

## 2014-02-26 ENCOUNTER — Telehealth: Payer: Self-pay | Admitting: Internal Medicine

## 2014-02-26 ENCOUNTER — Ambulatory Visit (INDEPENDENT_AMBULATORY_CARE_PROVIDER_SITE_OTHER): Payer: 59 | Admitting: Internal Medicine

## 2014-02-26 VITALS — BP 138/80 | HR 85 | Ht 63.0 in | Wt 196.0 lb

## 2014-02-26 DIAGNOSIS — R0600 Dyspnea, unspecified: Secondary | ICD-10-CM

## 2014-02-26 DIAGNOSIS — R599 Enlarged lymph nodes, unspecified: Secondary | ICD-10-CM

## 2014-02-26 DIAGNOSIS — R590 Localized enlarged lymph nodes: Secondary | ICD-10-CM

## 2014-02-26 DIAGNOSIS — R911 Solitary pulmonary nodule: Secondary | ICD-10-CM

## 2014-02-26 NOTE — Patient Instructions (Addendum)
#  Shortness of breath  -I have sent Dr Gwenlyn Found a note to re-evaluate you for reasons for inability to mount a fast heart rate   #Lung nodule Right upper lobe Aug 2015 - 17mm  - repeat ct chest wo contrast in 9 months from aug 2015   #Mediastinal granuloma  - Diagnosis most likely - stage 1 -for now watch it as we discussed given steroid side effects and natural history for stage 1 sarcoid to remit by itself  #Followup  - after 2-3 months to see me; if shortness of breath still a problem

## 2014-02-26 NOTE — Progress Notes (Signed)
Subjective:    Patient ID: Tracy Barnes, female    DOB: 1955-12-30, 58 y.o.   MRN: [id number]  HPI  PCP MALONEY,NANCY, MD   HPI  IOV 12/04/2013  Chief Complaint  Patient presents with  . Pulmonary Consult    Referred by Tona Sensing, NP for DOE x October 4364    58 year old obese female referred for dyspnea evaluation. In 2010 and had breast cancer and then was on Herceptin. During this time she did have some dyspnea and possibly some cough but this resolved by the time treatment ended in 2011. She was doing completely well to October 2014 when she suffered a cold and since then has had dyspnea on exertion that is associated with significant chest tightness in the upper part of the chest radiating to the neck. Symptoms are relieved only by rest. 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 There is no associated radiation to the back or to the arm or associated wheezing or cough or edema or syncope or paroxysmal nocturnal dyspnea or orthopnea. Symptoms are progressive and out of moderate intensity. Cardiac evaluation is  noncontributory. She has not tried any therapeutic inhalers or pulmonary rehabilitation for relief of dyspnea  She says that she's lost about 10 pounds over the past 2-3 months. She says that she does have occasional night sweats but has been having those since her early 5s when she had a hysterectomy. She is not had any unusual cough or wheezing. She says that her shortness of breath and chest discomfort are exertional, but the amount of exertion necessary varies greatly from day to day.   Expo hx and dyspnea relevant hx   CT chest 04/15/2010 - clear except for 3mm RML nodule non-calcified   1. Let her know PFT 12/07/13 normal (for my review: some flow volume loop variation +)  2/. CT chest 12/07/13: There is some pulmonary nodules small < 29mm that in right lower lobe - new since 2011 and  also new mediastinal node - new since 2011. To better understand this she needs PET Scan; please order. Let her know I cleared the PET scan with Dr Jana Hakim in the oncology depet   PET scan end oct 2015:  ymph nodes is a 1.9 cm short axis low right paratracheal lymph node (SUVmax = 5.5). Mediastinal and hilar lymph nodes are diffusely hypermetabolic ranging from SUVmax of 3.0-5.8. Numerous small pulmonary nodules again seen scattered throughout the lungs bilaterally, largest of which measures 7 mm in the periphery of the right upper lobe (image 33 of series 7). None of these demonstrate definite hypermetabolism on the PET portion of the examination. The previously described 7 x 8 mm nodular opacity in the medial aspect of the right upper lobe is favored to represent an interlobar lymph node, and is inseparable from the adjacent right hilar adenopathy on today's examination. No acute consolidative airspace disease. No pleural effusions   OV 01/08/2014  Chief Complaint  Patient presents with  . Follow-up    Pt here after bronch, PET and CT. Pt states her SOB is not any better. Pt c/o DOE, throat clearing and CP when SOB.    FU    dyspnea - still persists despite EBUS bx procedure. Took 4h to do house hold woirk yesterday. REleived by rest. Not any worse. FEels associated chest tightness   lung nodule - in 2011 had 86mm RML nodule - radiology now calling  this node. But Aug 2015 CT has 45mm RLL Nodules new 0- not seen on PET    MEdiastinal nodes - EBUS and med on 12/27/13 shows granuloma of mediastinum without cause,. New finding since 2011 CT chest. DEnies TB exposure, Collagen vasc dz hx or vasculitis hx. Denies travel to soil fungii states. No prior dx of sarcoid.   #Shortness of breath  - unclear why and if related to mediastinal granuloma nodes - do CPST bike test at [place]  #Lung nodule Right upper lobe Aug 2015 - 69mm  - repeat ct chest wo contrast in 9 months from aug  2015   #Mediastinal granuloma  - discussed range of possibilities from Sarcoid, TB, Soil Fungii like Histoplasma and Rheumatoid Arthritis, ANCA vasculitis  - of these sarcoid most likely - stage 1 -do blood test for ACE, ANA, DS-DNA, RF, CCP, ANCA screen and ANCA titer; will call with results - do Quantiferon gold blood test   #Followup  -affter CPST test; in 3-4 weeks   OV 02/26/2014  Chief Complaint  Patient presents with  . Follow-up     Pt states her breathing is unchanged. Pt here to review CPST.   Discuss 2 issues  Dyspnea  - CPST shows chronotropin incompetence with normal effort, VO2 max and AT. However, she is not on any beta blocker or Ca blocker  MEdiastinal granuloma  - : autoimmmune and Quantiferon gold all negative. 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  HENT: Negative for congestion, dental problem, ear pain, nosebleeds, postnasal drip, rhinorrhea, sinus pressure, sneezing, sore throat and trouble swallowing.   Eyes: Negative for redness and itching.  Respiratory: Positive for shortness of breath. Negative for cough, chest tightness and wheezing.   Cardiovascular: Negative for palpitations and leg swelling.  Gastrointestinal: Negative for nausea and vomiting.  Genitourinary: Negative for dysuria.  Musculoskeletal: Negative for joint swelling.  Skin: Negative for rash.  Neurological: Negative for headaches.  Hematological: Does not bruise/bleed easily.  Psychiatric/Behavioral: Negative for dysphoric mood. The patient is not nervous/anxious.   Current outpatient prescriptions: ALPRAZolam (XANAX) 0.25 MG tablet, Take 0.25 mg by mouth at bedtime as needed.  , Disp: , Rfl: ;  desvenlafaxine (PRISTIQ) 100 MG 24 hr tablet, Take 100 mg by mouth daily., Disp: , Rfl: ;  fluticasone (FLONASE) 50 MCG/ACT nasal spray, Place 2 sprays into both  nostrils daily. Pt uses as needed., Disp: , Rfl: ;  hydrochlorothiazide (HYDRODIURIL) 12.5 MG tablet, Take 12.5 mg by mouth daily., Disp: , Rfl:  levothyroxine (SYNTHROID, LEVOTHROID) 100 MCG tablet, Take 100 mcg by mouth daily before breakfast., Disp: , Rfl: ;  metFORMIN (GLUCOPHAGE) 500 MG tablet, Take 1,000 mg by mouth daily. , Disp: , Rfl: ;  pantoprazole (PROTONIX) 40 MG tablet, Take 40 mg by mouth 2 (two) times daily., Disp: , Rfl: ;  zolpidem (AMBIEN) 5 MG tablet, TAKE 1 TABLET BY MOUTH AT BEDTIME AS NEEDED FOR SLEEP, Disp: 30 tablet, Rfl: 0      Objective:   Physical Exam  Filed Vitals:   02/26/14 1347  BP: 138/80  Pulse: 85  Height: 5\' 3"  (1.6 m)  Weight: 196 lb (88.905 kg)  SpO2: 98%   Discussion only visit       Assessment & Plan:     ICD-9-CM ICD-10-CM   1. Dyspnea 786.09 R06.00  2. Lung nodule 793.11 R91.1   3. Mediastinal adenopathy 785.6 R59.9      #Shortness of breath  -I have sent Dr Gwenlyn Found a note to re-evaluate you for reasons for inability to mount a fast heart rate   #Lung nodule Right upper lobe Aug 2015 - 67mm  - repeat ct chest wo contrast in 9 months from aug 2015   #Mediastinal granuloma  - Diagnosis most likely - stage 1 -for now watch it as we discussed given steroid side effects and natural history for stage 1 sarcoid to remit by itself  #Followup  - after 2-3 months to see me; if shortness of breath still a problem   (> 50% of this 15 min visit spent in face to face counseling)

## 2014-02-26 NOTE — Telephone Encounter (Signed)
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 She is having dyspnea that I am not sure is related to these nodes. CPST shows chronotripic incompetence in absence of meds that can cause it. Could you please re-evaluate for this?  Thanks  Dr. Brand Males, M.D., [facility].C.P Pulmonary and Critical Care Medicine Staff Physician [place] [facility] Pager: [phone], If no answer or between  15:00h - 7:00h: call [phone]  02/26/2014 2:20 PM

## 2014-02-28 NOTE — Telephone Encounter (Signed)
Please arrange to have shock come back to see me sometime within the next 6-8 weeks

## 2014-03-01 NOTE — Telephone Encounter (Signed)
I spoke with patient and made a follow up appt with Dr Gwenlyn Found for 12/15

## 2014-03-08 DIAGNOSIS — R0600 Dyspnea, unspecified: Secondary | ICD-10-CM | POA: Diagnosis not present

## 2014-03-19 ENCOUNTER — Ambulatory Visit (INDEPENDENT_AMBULATORY_CARE_PROVIDER_SITE_OTHER): Payer: 59 | Admitting: Cardiovascular Disease

## 2014-03-19 ENCOUNTER — Encounter: Payer: Self-pay | Admitting: Cardiovascular Disease

## 2014-03-19 VITALS — BP 152/80 | HR 76 | Ht 63.0 in | Wt 196.0 lb

## 2014-03-19 DIAGNOSIS — R0600 Dyspnea, unspecified: Secondary | ICD-10-CM

## 2014-03-19 DIAGNOSIS — E785 Hyperlipidemia, unspecified: Secondary | ICD-10-CM

## 2014-03-19 DIAGNOSIS — I10 Essential (primary) hypertension: Secondary | ICD-10-CM | POA: Insufficient documentation

## 2014-03-19 NOTE — Patient Instructions (Signed)
Your physician wants you to follow-up in: 1 year with Dr Berry. You will receive a reminder letter in the mail two months in advance. If you don't receive a letter, please call our office to schedule the follow-up appointment.  

## 2014-03-19 NOTE — Assessment & Plan Note (Signed)
History of hypertension on hydrochlorothiazide.Tracy Barnes Her blood pressure today is 152/80. Continue current medications

## 2014-03-19 NOTE — Progress Notes (Signed)
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 She was referred by Dr. Reynold Barnes  for evaluation of progressive dyspnea on exertion and chest pain. Her cardiac risk factor profile is notable for diabetes, hypertension and hyperlipidemia. There is no family history. She does not smoke. She has had breast cancer and lumpectomy with radiation and chemotherapy currently treated by Dr. Chancy Barnes. She relates new onset dyspnea and chest pain after an upper respiratory tract infection in October last year. The chest pain radiates to her jaws bilaterally as well. I performed a 2-D echo which was normal, exercise Myoview stress test which was normal as well. She saw Dr. Chase Barnes who performed a cardiopulmonary exercise test that showed no changes in her oxygen consumption suggesting a cardiac cause of dyspnea.   Current Outpatient Prescriptions  Medication Sig Dispense Refill  . ALPRAZolam (XANAX) 0.25 MG tablet Take 0.25 mg by mouth at bedtime as needed.      . desvenlafaxine (PRISTIQ) 100 MG 24 hr tablet Take 100 mg by mouth daily.    . fluticasone (FLONASE) 50 MCG/ACT nasal spray Place 2 sprays into both nostrils daily. Pt uses as needed.    . hydrochlorothiazide (HYDRODIURIL) 12.5 MG tablet Take 12.5 mg by mouth daily.    Marland Kitchen levothyroxine (SYNTHROID, LEVOTHROID) 100 MCG tablet Take 100 mcg by mouth daily before breakfast.    . metFORMIN (GLUCOPHAGE) 500 MG tablet Take 1,000 mg by mouth daily.     . pantoprazole (PROTONIX) 40 MG tablet Take 40 mg by mouth 2 (two) times daily.    Marland Kitchen zolpidem (AMBIEN) 5 MG tablet TAKE 1 TABLET BY MOUTH AT BEDTIME AS NEEDED FOR SLEEP 30 tablet 0   No  current facility-administered medications for this visit.    Allergies  Allergen Reactions  . Codeine Itching    Updated per [place].    History   Social History  . Marital Status: Widowed    Spouse Name: N/A    Number of Children: N/A  . Years of Education: N/A   Occupational History  . system analyst [employer]   Social History Main Topics  . Smoking status: Former Smoker -- 0.10 packs/day for .5 years    Types: Cigarettes    Quit date: 04/20/1977  . Smokeless tobacco: Never Used  . Alcohol Use: No  . Drug Use: No  . Sexual Activity: Yes   Other Topics Concern  . Not on file   Social History Narrative     Review of Systems: General: negative for chills, fever, night sweats or weight changes.  Cardiovascular: negative for chest pain, dyspnea on exertion, edema, orthopnea, palpitations, paroxysmal nocturnal dyspnea or shortness of breath Dermatological: negative for rash Respiratory: negative for cough or wheezing Urologic: negative for hematuria Abdominal: negative for nausea, vomiting, diarrhea, bright red blood per rectum, melena, or hematemesis Neurologic: negative for visual changes, syncope, or dizziness All other systems reviewed and are otherwise negative except as noted above.    Blood pressure 152/80, pulse 76, height 5\' 3"  (1.6 m), weight 196 lb (88.905 kg).  General appearance: alert and no distress Neck: no adenopathy, no carotid bruit, no JVD, supple, symmetrical, trachea midline and thyroid not enlarged, symmetric,  no tenderness/mass/nodules Lungs: clear to auscultation bilaterally Heart: regular rate and rhythm, S1, S2 normal, no murmur, click, rub or gallop Extremities: extremities normal, atraumatic, no cyanosis or edema  EKG not performed today  ASSESSMENT AND PLAN:   Essential hypertension History of hypertension on hydrochlorothiazide.Marland Kitchen Her blood pressure today is 152/80. Continue current medications  Dyspnea History of dyspnea on  exertion with workup that has included a normal 2-D echocardiogram, normal Myoview stress test and most recently normal cardiopulmonary exercise test ruling out for all intents and purposes a cardiac cause of dyspnea or a pulmonary cause. I reassured Tracy Barnes that there is no physiologic abnormality that would be causing this. Potentially this is related to deconditioning and obesity. I will see her back in one year for follow-up.      Tracy Harp MD FACP,FACC,FAHA, [facility] 03/19/2014 4:04 PM

## 2014-03-19 NOTE — Assessment & Plan Note (Signed)
History of dyspnea on exertion with workup that has included a normal 2-D echocardiogram, normal Myoview stress test and most recently normal cardiopulmonary exercise test ruling out for all intents and purposes a cardiac cause of dyspnea or a pulmonary cause. I reassured Tracy Barnes that there is no physiologic abnormality that would be causing this. Potentially this is related to deconditioning and obesity. I will see her back in one year for follow-up.

## 2014-04-03 ENCOUNTER — Ambulatory Visit: Payer: 59 | Admitting: Cardiovascular Disease

## 2014-04-07 ENCOUNTER — Other Ambulatory Visit: Payer: Self-pay | Admitting: Nurse Practitioner

## 2014-05-07 ENCOUNTER — Telehealth: Payer: Self-pay | Admitting: Hematology and Oncology

## 2014-05-07 NOTE — Telephone Encounter (Signed)
s.w. pt and advised on 2.25 appt moved to earlier time due to MD on call....pt ok and aware

## 2014-06-07 ENCOUNTER — Other Ambulatory Visit (HOSPITAL_BASED_OUTPATIENT_CLINIC_OR_DEPARTMENT_OTHER): Payer: 59

## 2014-06-07 DIAGNOSIS — C50312 Malignant neoplasm of lower-inner quadrant of left female breast: Secondary | ICD-10-CM

## 2014-06-07 DIAGNOSIS — C50919 Malignant neoplasm of unspecified site of unspecified female breast: Secondary | ICD-10-CM

## 2014-06-07 LAB — CBC WITH DIFFERENTIAL/PLATELET
BASO%: 1.4 % (ref 0.0–2.0)
Basophils Absolute: 0.1 10*3/uL (ref 0.0–0.1)
EOS ABS: 0.2 10*3/uL (ref 0.0–0.5)
EOS%: 2.9 % (ref 0.0–7.0)
HEMATOCRIT: 39.6 % (ref 34.8–46.6)
HGB: 12.5 g/dL (ref 11.6–15.9)
LYMPH#: 1.1 10*3/uL (ref 0.9–3.3)
LYMPH%: 17.9 % (ref 14.0–49.7)
MCH: 22.4 pg — ABNORMAL LOW (ref 25.1–34.0)
MCHC: 31.6 g/dL (ref 31.5–36.0)
MCV: 71 fL — ABNORMAL LOW (ref 79.5–101.0)
MONO#: 0.4 10*3/uL (ref 0.1–0.9)
MONO%: 6.3 % (ref 0.0–14.0)
NEUT#: 4.2 10*3/uL (ref 1.5–6.5)
NEUT%: 71.5 % (ref 38.4–76.8)
PLATELETS: 233 10*3/uL (ref 145–400)
RBC: 5.58 10*6/uL — ABNORMAL HIGH (ref 3.70–5.45)
RDW: 15.8 % — ABNORMAL HIGH (ref 11.2–14.5)
WBC: 5.9 10*3/uL (ref 3.9–10.3)
nRBC: 0 % (ref 0–0)

## 2014-06-07 LAB — COMPREHENSIVE METABOLIC PANEL (CC13)
ALBUMIN: 3.8 g/dL (ref 3.5–5.0)
ALK PHOS: 70 U/L (ref 40–150)
ALT: 18 U/L (ref 0–55)
ANION GAP: 12 meq/L — AB (ref 3–11)
AST: 15 U/L (ref 5–34)
BUN: 17.1 mg/dL (ref 7.0–26.0)
CO2: 22 meq/L (ref 22–29)
Calcium: 9 mg/dL (ref 8.4–10.4)
Chloride: 105 mEq/L (ref 98–109)
Creatinine: 0.8 mg/dL (ref 0.6–1.1)
EGFR: 87 mL/min/{1.73_m2} — ABNORMAL LOW (ref >90)
Glucose: 146 mg/dl — ABNORMAL HIGH (ref 70–140)
Potassium: 4.1 mEq/L (ref 3.5–5.1)
SODIUM: 139 meq/L (ref 136–145)
TOTAL PROTEIN: 6.4 g/dL (ref 6.4–8.3)
Total Bilirubin: 0.38 mg/dL (ref 0.20–1.20)

## 2014-06-14 ENCOUNTER — Ambulatory Visit (HOSPITAL_BASED_OUTPATIENT_CLINIC_OR_DEPARTMENT_OTHER): Payer: 59 | Admitting: Hematology and Oncology

## 2014-06-14 ENCOUNTER — Telehealth: Payer: Self-pay | Admitting: Hematology and Oncology

## 2014-06-14 VITALS — BP 145/75 | HR 93 | Temp 98.6°F | Resp 18 | Ht 63.0 in | Wt 194.5 lb

## 2014-06-14 DIAGNOSIS — Z853 Personal history of malignant neoplasm of breast: Secondary | ICD-10-CM

## 2014-06-14 DIAGNOSIS — C50912 Malignant neoplasm of unspecified site of left female breast: Secondary | ICD-10-CM

## 2014-06-14 NOTE — Telephone Encounter (Signed)
sent outlook msg to Mike Craze that pt needs appt for 39yr with survivorship clinic

## 2014-06-14 NOTE — Progress Notes (Signed)
Patient Care Team: Sheela Stack, MD as PCP - General (Endocrinology)  DIAGNOSIS: No matching staging information was found for the patient.  SUMMARY OF ONCOLOGIC HISTORY:   Breast cancer, left breast   02/19/2010 Initial Biopsy Left breast needle biopsy: Focal invasive ductal carcinoma with high-grade DCIS ER 0%, PR 0%, HER-2 amplified ratio 2.79   03/25/2010 Surgery Left breast lumpectomy: High-grade DCIS with calcifications, 4 benign lymph nodes, tumor size 1.5 cm ER 0%, PR 0%, Ki-67 60%, HER-2 amplified ratio 2.79   04/16/2010 - 04/14/2011 Chemotherapy Taxol Herceptin followed by Herceptin maintenance completed 04/17/2011   07/07/2010 - 08/13/2010 Radiation Therapy Adjuvant radiation therapy    CHIEF COMPLIANT: Annual follow-up of breast cancer  INTERVAL HISTORY: Tracy Barnes is a 59 year old lady with above-mentioned history of left-sided breast cancer treated with lumpectomy followed by adjuvant chemotherapy and radiation is currently in observation. She's been doing very well without any major problems or concerns. She works at the [employer] in [employer]. She reports that she walks 2-3 times a week. Denies any lumps or nodules in the breasts. Beneath the left nipple she feels like an occasional tinge of pain where she has a surgical scar.  REVIEW OF SYSTEMS:   Constitutional: Denies fevers, chills or abnormal weight loss Eyes: Denies blurriness of vision Ears, nose, mouth, throat, and face: Denies mucositis or sore throat Respiratory: Denies cough, dyspnea or wheezes Cardiovascular: Denies palpitation, chest discomfort or lower extremity swelling Gastrointestinal:  Denies nausea, heartburn or change in bowel habits Skin: Denies abnormal skin rashes Lymphatics: Denies new lymphadenopathy or easy bruising Neurological:Denies numbness, tingling or new weaknesses Behavioral/Psych: Mood is stable, no new changes  Breast:  denies any pain or lumps or nodules in  either breasts All other systems were reviewed with the patient and are negative.  I have reviewed the past medical history, past surgical history, social history and family history with the patient and they are unchanged from previous note.  ALLERGIES:  is allergic to codeine.  MEDICATIONS:  Current Outpatient Prescriptions  Medication Sig Dispense Refill  . ALPRAZolam (XANAX) 0.25 MG tablet Take 0.25 mg by mouth at bedtime as needed.      . ciprofloxacin (CIPRO) 500 MG tablet Take 500 mg by mouth 2 (two) times daily.    Marland Kitchen desvenlafaxine (PRISTIQ) 100 MG 24 hr tablet Take 100 mg by mouth daily.    . fluticasone (FLONASE) 50 MCG/ACT nasal spray Place 2 sprays into both nostrils daily. Pt uses as needed.    . hydrochlorothiazide (HYDRODIURIL) 12.5 MG tablet Take 12.5 mg by mouth daily.    Marland Kitchen levothyroxine (SYNTHROID, LEVOTHROID) 100 MCG tablet Take 100 mcg by mouth daily before breakfast.    . metFORMIN (GLUCOPHAGE) 500 MG tablet Take 1,000 mg by mouth daily.     . pantoprazole (PROTONIX) 40 MG tablet Take 40 mg by mouth 2 (two) times daily.    Marland Kitchen zolpidem (AMBIEN) 5 MG tablet TAKE 1 TABLET BY MOUTH AT BEDTIME AS NEEDED FOR SLEEP 30 tablet 0   No current facility-administered medications for this visit.    PHYSICAL EXAMINATION: ECOG PERFORMANCE STATUS: 0 - Asymptomatic  Filed Vitals:   06/14/14 0916  BP: 145/75  Pulse: 93  Temp: 98.6 F (37 C)  Resp: 18   Filed Weights   06/14/14 0916  Weight: 194 lb 8 oz (88.225 kg)    GENERAL:alert, no distress and comfortable SKIN: skin color, texture, turgor are normal, no rashes or significant lesions EYES: normal,  Conjunctiva are pink and non-injected, sclera clear OROPHARYNX:no exudate, no erythema and lips, buccal mucosa, and tongue normal  NECK: supple, thyroid normal size, non-tender, without nodularity LYMPH:  no palpable lymphadenopathy in the cervical, axillary or inguinal LUNGS: clear to auscultation and percussion with normal  breathing effort HEART: regular rate & rhythm and no murmurs and no lower extremity edema ABDOMEN:abdomen soft, non-tender and normal bowel sounds Musculoskeletal:no cyanosis of digits and no clubbing  NEURO: alert & oriented x 3 with fluent speech, no focal motor/sensory deficits BREAST: No palpable masses or nodules in either right or left breasts. No palpable axillary supraclavicular or infraclavicular adenopathy no breast tenderness or nipple discharge. (exam performed in the presence of a chaperone)  LABORATORY DATA:  I have reviewed the data as listed   Chemistry      Component Value Date/Time   NA 139 06/07/2014 0753   NA 142 12/21/2013 1347   K 4.1 06/07/2014 0753   K 4.1 12/21/2013 1347   CL 99 12/21/2013 1347   CL 105 10/03/2012 0854   CO2 22 06/07/2014 0753   CO2 26 12/21/2013 1347   BUN 17.1 06/07/2014 0753   BUN 17 12/21/2013 1347   CREATININE 0.8 06/07/2014 0753   CREATININE 0.66 12/21/2013 1347      Component Value Date/Time   CALCIUM 9.0 06/07/2014 0753   CALCIUM 10.2 12/21/2013 1347   ALKPHOS 70 06/07/2014 0753   ALKPHOS 78 12/21/2013 1347   AST 15 06/07/2014 0753   AST 16 12/21/2013 1347   ALT 18 06/07/2014 0753   ALT 17 12/21/2013 1347   BILITOT 0.38 06/07/2014 0753   BILITOT 0.3 12/21/2013 1347       Lab Results  Component Value Date   WBC 5.9 06/07/2014   HGB 12.5 06/07/2014   HCT 39.6 06/07/2014   MCV 71.0* 06/07/2014   PLT 233 06/07/2014   NEUTROABS 4.2 06/07/2014     RADIOGRAPHIC STUDIES: I have personally reviewed the radiology reports and agreed with their findings. 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 On the final surgical excision there was only high-grade DCIS. 4 sentinel lymph nodes were negative, T1 N0 M0 stage IA ER PR 0% HER-2 positive Ki-67 60% status post adjuvant chemotherapy with Taxol  Herceptin 9 weeks followed by Herceptin maintenance for 1 year status post radiation therapy currently on observation  Breast cancer surveillance: 1. Breast exam 06/14/2014 is normal 2. Mammogram 02/13/2014 is normal  Survivorship:Discussed the importance of physical exercise in decreasing the likelihood of breast cancer recurrence. Recommended 30 mins daily 6 days a week of either brisk walking or cycling or swimming. Encouraged patient to eat more fruits and vegetables and decrease red meat.   Return to clinic in 1 year for follow-up in survivorship clinic   No orders of the defined types were placed in this encounter.   The patient has a good understanding of the overall plan. she agrees with it. She will call with any problems that may develop before her next visit here.   Rulon Eisenmenger, MD

## 2014-06-14 NOTE — Assessment & Plan Note (Signed)
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 On the final surgical excision there was only high-grade DCIS. 4 sentinel lymph nodes were negative, T1 N0 M0 stage IA ER PR 0% HER-2 positive Ki-67 60% status post adjuvant chemotherapy with Taxol Herceptin 9 weeks followed by Herceptin maintenance for 1 year status post radiation therapy currently on observation  Breast cancer surveillance: 1. Breast exam 06/14/2014 is normal 2. Mammogram 02/13/2014 is normal  Survivorship:Discussed the importance of physical exercise in decreasing the likelihood of breast cancer recurrence. Recommended 30 mins daily 6 days a week of either brisk walking or cycling or swimming. Encouraged patient to eat more fruits and vegetables and decrease red meat.   Return to clinic in 1 year for follow-up in survivorship clinic

## 2014-07-24 ENCOUNTER — Ambulatory Visit: Payer: 59

## 2014-09-03 ENCOUNTER — Other Ambulatory Visit: Payer: 59

## 2014-09-03 ENCOUNTER — Ambulatory Visit (INDEPENDENT_AMBULATORY_CARE_PROVIDER_SITE_OTHER)
Admission: RE | Admit: 2014-09-03 | Discharge: 2014-09-03 | Disposition: A | Discharge status: Home / Self Care | Payer: 59 | Source: Ambulatory Visit | Attending: Internal Medicine | Admitting: Internal Medicine

## 2014-09-03 DIAGNOSIS — R911 Solitary pulmonary nodule: Secondary | ICD-10-CM | POA: Diagnosis not present

## 2014-09-03 DIAGNOSIS — R0600 Dyspnea, unspecified: Secondary | ICD-10-CM | POA: Diagnosis not present

## 2014-09-05 ENCOUNTER — Telehealth: Payer: Self-pay | Admitting: Internal Medicine

## 2014-09-05 NOTE — Telephone Encounter (Signed)
Pt would like results from CT done on 09/04/14.  MR - please advise. Thanks.

## 2014-09-06 NOTE — Telephone Encounter (Signed)
I spoke with patient about results and she verbalized understanding and had no questions 

## 2014-09-06 NOTE — Telephone Encounter (Signed)
  No change in nodule  X 4.5 years.  No need for repeat ct   reports that she quit smoking about 37 years ago. Her smoking use included Cigarettes. She has a .05 pack-year smoking history. She has never used smokeless tobacco.    Ct Chest Wo Contrast  09/03/2014   CLINICAL DATA:  Follow up lung nodule.  Sarcoidosis.  Dyspnea.  EXAM: CT CHEST WITHOUT CONTRAST  TECHNIQUE: Multidetector CT imaging of the chest was performed following the standard protocol without IV contrast.  COMPARISON:  Multiple exams, including 12/15/2013  FINDINGS: Mediastinum/Nodes: Right upper paratracheal node 0.8 cm in short axis, previously 0.8.  Right lower paratracheal lymph node 1.8 cm in short axis, previously 1.9.  AP window lymph node 1.5 cm in short axis on image 20 series 2, previously 1.5.  Bilateral hilar and subcarinal adenopathy is present as well.  Lungs/Pleura: No generalized airway thickening or peribronchovascular nodules. Scattered pulmonary nodules are present, with an L-shaped nodule measuring 0.7 by 0.8 cm in the right middle lobe on image 32 series 3, formerly 0.7 by 0.7 cm. The remaining pulmonary nodules appear stable.  Upper abdomen: Unremarkable  Musculoskeletal: Unremarkable  IMPRESSION: 1. Stable mediastinal and hilar adenopathy compared to the recent PET-CT of 12/15/2013. Stable pulmonary nodules, largest a 7 by 8 mm right middle lobe pulmonary nodule. This right middle lobe index nodule also measured 0.7 by 0.7 cm back in 2011, and accordingly has not appreciably grown over the past 4.5 years, suggesting a benign etiology. The appearance most readily ascribed mobile to sarcoidosis.   Electronically Signed   By: Van Clines M.D.   On: 09/03/2014 09:48

## 2014-09-20 ENCOUNTER — Ambulatory Visit: Payer: 59

## 2014-10-08 ENCOUNTER — Other Ambulatory Visit: Payer: Self-pay

## 2014-10-08 ENCOUNTER — Ambulatory Visit: Payer: 59

## 2014-10-08 VITALS — BP 138/88 | HR 83 | Ht 63.0 in | Wt 193.8 lb

## 2014-10-08 DIAGNOSIS — R7303 Prediabetes: Secondary | ICD-10-CM

## 2014-10-08 NOTE — Patient Instructions (Signed)
1. Plan to walk 45 minutes a day at work  2. Plan to check blood sugar once a week with goals of less than 100 fasting and less than 140 2 hours after eating 3. Plan to limit carbohydrates to 2-3 (30-45) gm servings a meal and 15 gms at snacks.  Try to have protein with snacks 4. Plan to see Dr. Forde Dandy on 12/10/14 5. Plan to return to [facility] on 03/05/15 at 3:30PM at [place] office

## 2014-10-08 NOTE — Patient Outreach (Signed)
[facility]) Care Management   10/08/2014  Tracy Barnes [birth year] [id number]  Tracy Barnes is an 59 y.o. female.   Member seen for follow up office visit for Link to Wellness program for self management of prediabetes  Subjective: Member states that she has been trying to not gain any weight.  States that now that she is living alone she would like to try to lose some weight.  States she is walking everyday at work.  States she is to see Dr.South in August.  States she had not been checking her blood sugars as the battery on her glucometer was dead but she has replaced it.  Objective:   Review of Systems  All other systems reviewed and are negative.   Physical Exam  Filed Vitals:   10/08/14 1510  BP: 138/88  Pulse: 83   Filed Weights   10/08/14 1510  Weight: 193 lb 12.8 oz (87.907 kg)    Current Medications:   Current Outpatient Prescriptions  Medication Sig Dispense Refill  . ALPRAZolam (XANAX) 0.25 MG tablet Take 0.25 mg by mouth at bedtime as needed.      . desvenlafaxine (PRISTIQ) 100 MG 24 hr tablet Take 100 mg by mouth daily.    . fluticasone (FLONASE) 50 MCG/ACT nasal spray Place 2 sprays into both nostrils daily. Pt uses as needed.    . hydrochlorothiazide (HYDRODIURIL) 12.5 MG tablet Take 12.5 mg by mouth daily.    Marland Kitchen levothyroxine (SYNTHROID, LEVOTHROID) 100 MCG tablet Take 100 mcg by mouth daily before breakfast.    . metFORMIN (GLUCOPHAGE) 500 MG tablet Take 500 mg by mouth 2 (two) times daily with a meal.     . pantoprazole (PROTONIX) 40 MG tablet Take 40 mg by mouth 2 (two) times daily.    Marland Kitchen zolpidem (AMBIEN) 5 MG tablet TAKE 1 TABLET BY MOUTH AT BEDTIME AS NEEDED FOR SLEEP 30 tablet 0  . ciprofloxacin (CIPRO) 500 MG tablet Take 500 mg by mouth 2 (two) times daily.     No current facility-administered medications for this visit.    Functional Status:   In your present state of health, do you have any difficulty performing the  following activities: 10/08/2014 12/21/2013  Hearing? N N  Vision? N N  Difficulty concentrating or making decisions? - N  Walking or climbing stairs? N N  Dressing or bathing? N N  Doing errands, shopping? N -  Preparing Food and eating ? N -  Using the Toilet? N -  In the past six months, have you accidently leaked urine? N -  Do you have problems with loss of bowel control? N -  Managing your Medications? N -  Managing your Finances? N -  Housekeeping or managing your Housekeeping? N -    Fall/Depression Screening:    PHQ 2/9 Scores 10/08/2014  PHQ - 2 Score 0   THN CM Care Plan Problem One        Patient Outreach from 10/08/2014 in [place] Problem One  Potentilal for elevated blood sugars related to dx of prediabtes   Care Plan for Problem One  Active   THN Long Term Goal (31-90 days)  Member will maintain hemoglobin A1C below 6.5 for the next 90 days   THN Long Term Goal Start Date  10/08/14   Interventions for Problem One Long Term Goal  Reviewed CHO counting and portion control, Discussed weight loss programs such as Weight Watchers or using  My Fitness Pal, instructed to try to eat protein with snacks , Reinforced importance  of regular exercise for  glycemic control, instructed to keep appointment with Dr.South in August       Assessment:   Member seen for follow up office visit for Link to Wellness program for self management of prediabetes.  Member is maintaining her hemoglobin A1C within prediabetes range with last reading of 6.3.  She reports she is walking 5 days a week for 30 minutes.  She is watching her CHO servings but could increase her protein intake with meals and snacks.  Plan:  Plan to walk 45 minutes a day at work  Plan to check blood sugar once a week with goals of less than 100 fasting and less than 140 2 hours after eating Plan to limit carbohydrates to 2-3 (30-45) gm servings a meal and 15 gms at snacks.  Try to have protein with  snacks Plan to see Dr. Forde Dandy on 12/10/14 Plan to return to Link to Wellness on 03/05/15 at 3:30PM at [place] office  Peter Garter RN, [facility] Care Management Coordinator-Link to [place] Management [phone]

## 2015-01-04 ENCOUNTER — Telehealth: Payer: Self-pay | Admitting: Adult Health

## 2015-01-04 NOTE — Telephone Encounter (Signed)
Ms. Bynum called me this morning with some questions and concerns regarding her follow-up for breast cancer.  She is now about 4 years out from her diagnosis and treatment completion.  She is concerned because she has been experiencing a new symptom of "itching that feels like it is deep inside" of the left (formerly treated breast).    Ms. Boutelle was a patient of Dr. Geralyn Flash who has transitioned her over to survivorship for subsequent follow-up.  Therefore, given this new concern, Ms. Nyman will see Chestine Spore, NP next week in the breast survivorship clinic.  At that time, Nira Conn will decide when Ms. Lawhorn should be seen for subsequent follow-up.  She tells me that she is scheduled to have her mammogram in October.  (She was originally on our list to be scheduled in survivorship in 05/2015).  I am sharing this note with Audie Clear in scheduling to make her aware of potential changes to this patient's follow-up schedule, as well.   I gave Ms. Reitman instructions on where to check in for this appt.  I encouraged her to call me with any additional questions or concerns.  We look forward to participating in her care.    Mike Craze, NP [place] [phone]

## 2015-01-10 ENCOUNTER — Other Ambulatory Visit: Payer: Self-pay | Admitting: Nurse Practitioner

## 2015-01-10 ENCOUNTER — Ambulatory Visit (HOSPITAL_BASED_OUTPATIENT_CLINIC_OR_DEPARTMENT_OTHER): Payer: 59 | Admitting: Nurse Practitioner

## 2015-01-10 ENCOUNTER — Encounter: Payer: Self-pay | Admitting: Nurse Practitioner

## 2015-01-10 VITALS — BP 145/76 | HR 95 | Temp 98.0°F | Resp 17 | Ht 63.0 in | Wt 196.5 lb

## 2015-01-10 DIAGNOSIS — C50912 Malignant neoplasm of unspecified site of left female breast: Secondary | ICD-10-CM | POA: Diagnosis not present

## 2015-01-10 DIAGNOSIS — R234 Changes in skin texture: Secondary | ICD-10-CM

## 2015-01-10 NOTE — Progress Notes (Signed)
CLINIC:  Cancer Survivorship   REASON FOR VISIT:  Routine follow-up post-treatment for history of breast cancer.  BRIEF ONCOLOGIC HISTORY:    Breast cancer, left breast   02/07/2010 Mammogram Left breast: calcifications; further imaging needed.   02/19/2010 Breast US Left breast: Two groups of suspicious microcalcifications in the inner lower quadrant.   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 Right breast unremarkable.   02/25/2010 Clinical Stage Stage 1: T1 N0   03/25/2010 Definitive Surgery Left breast lumpectomy: High-grade DCIS with calcifications, 1.5 cm, 4 benign lymph nodes. No invasive disease noted at time of resection.   03/25/2010 Pathologic Stage Stage IA: T1 N0   04/16/2010 - 04/17/2011 Chemotherapy Adjuvant chemotherapy Tracy Barnes): Taxol/ Herceptin x 9 cycles followed by 1 year Herceptin maintenance completed 04/17/2011   06/18/2010 - 08/04/2010 Radiation Therapy Adjuvant RT completed Tracy Barnes). Left breast: 45 Gy over 25 fractions; Left breast boost 16 Gy over 8 fractions.  Total dose: 60 Gy    INTERVAL HISTORY:  Tracy Barnes presents to the [facility] today for ongoing follow up regarding her breast cancer. Tracy Barnes reports the development of itching in her left breast.  She first noticed it about six months ago, however states that it has become more intense over the past two months.  Tracy Barnes states that it is deep within her breast, rather than along the surface.  She denies any rash, scaling, thickening, nipple discharge or bleeding. She reports that she has had some intermittent redness along her nipple, but it is better today. She denies any mass or nodularity in either breast,and denies any  recent trauma to the breast.  Her last mammogram was in October 2015 and was stable. She reports a good appetite and denies any fatigue, pain, weight loss, cough, or headache.  Tracy Barnes does report shortness of breath,which she says is related to her sarcoid.  Her last CT scan of the chest was in May 2016 and was stable.  She believes that she is due to see Dr. Chase Caller but does not currently have an appointment.    REVIEW OF SYSTEMS:  General: Mild hot flashes. Denies fever or chills. Cardiac: Denies palpitations, chest pain, and lower extremity edema.  Respiratory: Denies dyspnea on exertion. Sleeps on two pillows at night. GI: Denies abdominal pain, constipation, diarrhea, nausea, or vomiting.  GU: Denies dysuria, hematuria, vaginal bleeding, vaginal discharge, or vaginal dryness.  Musculoskeletal: Denies joint or bone pain.  Neuro: Denies recent falls. Denies peripheral neuropathy. Skin: Denies rash, pruritis, or open wounds.  Breast: As above. Psych: Denies depression, anxiety, insomnia, or memory loss.   A 14-point review of systems was completed and was negative, except as noted above.   ONCOLOGY TREATMENT TEAM:  1. Surgeon:  Dr. Dalbert Barnes at [facility]  2. Medical Oncologist: Dr. Lindi Barnes 3. Radiation Oncologist: Dr. Pablo Barnes    PAST MEDICAL/SURGICAL HISTORY:  Past Medical History  Diagnosis Date  . Cancer 02/19/2010    left breast  . Hx of radiation therapy 06/18/10 to 08/01/10    L breast  . Diabetes mellitus   . Hypertension   . Hyperlipidemia   . Dyspnea on exertion   . Chest pain   . GERD (gastroesophageal reflux disease)    Past  Surgical History  Procedure Laterality Date  . Abdominal hysterectomy      At 79 yrs of age. One ovary removed. Surgery due to fibroids per medical history form dated 02/25/10.  . Breast surgery  03/25/2010    left lumpectomy  . Deep axillary sentinel node biopsy / excision  03/25/2010    4/4 nodes negative  . Portacath  placement  04/18/2010    Dr. Fanny Skates  . Port-a-cath removal  06/05/2011    Procedure: MINOR REMOVAL PORT-A-CATH;  Surgeon: Adin Hector, MD;  Location: [facility];  Service: General;  Laterality: N/A;  right  . Video bronchoscopy with endobronchial ultrasound N/A 12/27/2013    Procedure: VIDEO BRONCHOSCOPY WITH ENDOBRONCHIAL ULTRASOUND;  Surgeon: Melrose Nakayama, MD;  Location: [facility];  Service: Thoracic;  Laterality: N/A;  . Mediastinoscopy N/A 12/27/2013    Procedure: MEDIASTINOSCOPY;  Surgeon: Melrose Nakayama, MD;  Location: [facility];  Service: Thoracic;  Laterality: N/A;     ALLERGIES:  Allergies  Allergen Reactions  . Codeine Itching    Updated per [place].     CURRENT MEDICATIONS:  Current Outpatient Prescriptions on File Prior to Visit  Medication Sig Dispense Refill  . ALPRAZolam (XANAX) 0.25 MG tablet Take 0.25 mg by mouth at bedtime as needed.      . desvenlafaxine (PRISTIQ) 100 MG 24 hr tablet Take 100 mg by mouth daily.    . fluticasone (FLONASE) 50 MCG/ACT nasal spray Place 2 sprays into both nostrils daily. Pt uses as needed.    . hydrochlorothiazide (HYDRODIURIL) 12.5 MG tablet Take 12.5 mg by mouth daily.    Marland Kitchen levothyroxine (SYNTHROID, LEVOTHROID) 100 MCG tablet Take 100 mcg by mouth daily before breakfast.    . metFORMIN (GLUCOPHAGE) 500 MG tablet Take 500 mg by mouth 2 (two) times daily with a meal.     . pantoprazole (PROTONIX) 40 MG tablet Take 40 mg by mouth 2 (two) times daily.    Marland Kitchen zolpidem (AMBIEN) 5 MG tablet TAKE 1 TABLET BY MOUTH AT BEDTIME AS NEEDED FOR SLEEP 30 tablet 0   No current facility-administered medications on file prior to visit.     ONCOLOGIC FAMILY HISTORY:  Family History  Problem Relation Age of Onset  . Cancer Mother     Cancer of bladder and brain per medical history form dated 02/25/10.  Marland Kitchen Heart disease Father   . COPD Father   . Aortic stenosis Father       SOCIAL HISTORY:  Tracy Barnes is  widowed and lives alone in [place], [place].   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  She denies any current or history of alcohol or illicit drug use.  She is a former smoker, having quit in 1979.   PHYSICAL EXAMINATION:  Vital Signs:   Filed Vitals:   01/10/15 0845  BP: 145/76  Pulse: 95  Temp: 98 F (36.7 C)  Resp: 17   ECOG performance status: 0 General: Well-nourished, well-appearing female in no acute distress.  She is unaccompanied in clinic today.   HEENT: Head is atraumatic and normocephalic.  Pupils equal and reactive to light and accomodation. Conjunctivae clear without exudate.  Sclerae anicteric. Oral mucosa is pink, moist, and intact without lesions.  Oropharynx is pink without lesions or erythema.  Lymph: No cervical, supraclavicular, infraclavicular, or axillary lymphadenopathy noted on palpation.  Cardiovascular: Regular rate and rhythm without murmurs, rubs, or gallops. Respiratory: Clear to  auscultation bilaterally. Chest expansion symmetric without accessory muscle use on inspiration or expiration.  Breast: Right breast without mass or nodularity.  Left breast with thickening throughout, consistent with post-radiation changes.  No distinct mass or nodule. Tender to palpation particularly about the nipple.  No erythema, rash, or p'eau d'orange.  No nipple discharge, bleeding, or axillary lymphadenopathy bilaterally.   GI: Abdomen soft and round. No tenderness to palpation. Bowel sounds normoactive in 4 quadrants. No hepatosplenomegaly.   GU: Deferred.  Musculoskeletal: Muscle strength 5/5 in all extremities.  Full ROM noted in all extremities.  Neuro: No focal deficits. Steady gait.  Psych: Mood and affect normal and appropriate for situation.  Extremities: No edema, cyanosis, or clubbing.  Skin: Warm and dry. No open lesions noted.   LABORATORY DATA:  No results found for this or any previous visit (from the past  2160 hour(s)).  DIAGNOSTIC IMAGING: No results found for this visit.  Last mammogram February 13, 2014 reveals no suspicious mass, nonsurgical distortion or worrisome calcifications bilaterally.      ASSESSMENT AND PLAN:   1. History of breast cancer: Stage IA (T1N0) invasive ductal carcinoma (at time of biopsy; no invasive disease at time of definitive surgery), ER negative, PR negative, HER2/neu positive, Ki67 60%, with high grade DCIS, S/P lumpectomy, adjuvant chemotherapy with paclitaxel and trastuzumab x 9 cycles, followed by maintenance trastuzumab to complete one year of therapy 04/17/2011, followed in a program of surveillance since that time.   Ms. Behl clinical exam does not demonstrate findings consistent with etiologies such as Paget's disease or inflammatory breast cancer, however she does demonstrate increased pain to palpation, particularly along the nipple.  That, coupled with the six month history of itching, needs to be further evaluated. I have ordered a diagnostic mammogram to be performed ASAP to evaluate her symptoms.  Further disposition pending those results.  If negative and symptoms improve, she will continue in a program of surveillance and return to the Survivorship clinic in February 2017.   2. Sarcoidosis:  I encouraged Ms. Barberi to call Dr. Golden Pop office to discuss plans for ongoing surveillance.  3. Cancer screening:  Due to Ms. Motta's history and her age, she should receive screening for skin cancers and colon cancer.  These, along with recommendations for health maintenance and wellness promotion were reviewed.  4. Health maintenance and wellness promotion: Ms. Hoeger was encouraged to maximize her intake of fruits and vegetables per day. She was also encouraged to engage in moderate exercise for 30 minutes per day most days of the week. She should continue to abstain from smoking.    A total of 35 minutes of face-to-face time was spent with  this patient with greater than 50% of that time in counseling and care-coordination.   Sylvan Cheese, NP  Survivorship Program [facility] [phone]   Note: PRIMARY CARE PROVIDER Sheela Stack, [place] [phone]

## 2015-01-10 NOTE — Patient Instructions (Signed)
It was great to meet you today!  As we discussed, we will plan to get your mammogram at the Breast Clinic scheduled to evaluate the itching and make sure there have been no changes since last year.  I will plan to schedule your follow up appointment after we learn those results.  Let me know if any of your symptoms worsen in the meantime and we will be in touch soon to schedule that appointment.  Symptoms to Watch for and Report to Your Provider  . Return of the cancer symptoms you had before- such as a lump or new growth where your cancer first started . New or unusual pain that seems unrelated to an injury and does not go away, including back pain or bone pain . Weight loss without trying/intending . Unexplained bleeding . A rash or allergic reaction, such as swelling, severe itching or wheezing . Chills or fevers . Persistent headaches . Shortness of breath or difficulty breathing . Bloody stools or blood in your urine . Lumps, bumps, swelling and/or nipple discharge . Nausea, vomiting, diarrhea, loss of appetite, or trouble swallowing . A cough that doesn't go away . Abdominal pain . Swelling in your arms or legs . Fractures . Hot flashes or other menopausal symptoms . Any other signs mentioned by your doctor or nurse or any unusual symptoms                 that you just can't explain   NOTE: Just because you have certain symptoms, it doesn't mean the cancer has come back or you have a new cancer. Symptoms can be due to other problems that need to be addressed.  It is important to watch for these symptoms and report them to your provider so you can be medically evaluated for any of these concerns!    Living a Life of Wellness After Cancer:  *Note: Please consult your health care provider before using any medications, supplements, over-the-counter products, or other interventions.  Also, please consult your primary care provider before you begin any lifestyle program (diet, exercise,  etc.).  Your safety is our top priority and we want to make sure you continue to live a long and healthy life!    Healthy Lifestyle Recommendations  As a cancer survivor, it is important develop a lifelong commitment to a healthy lifestyle. A healthy lifestyle can prevent cancer from returning as well as prevent other diseases like heart disease, diabetes and high blood pressure.  These are some things that you can do to have a healthy lifestyle:  Marland Kitchen Maintain a healthy weight.  . Exercise daily per your doctor's orders. . Eat a balanced diet high in fruits, vegetables, bran, and fiber. . Limit how much alcohol you consume, if at all. Ali Lowe regular bone mineral density testing for osteoporosis.  . Talk to your doctor about cardiovascular disease or "heart disease" screening. . Stop smoking (if you smoke). . Know your family history. . Be mindful of your emotional, social, and spiritual needs. . Meet regularly with a Primary Care Provider (PCP). Find a PCP if you do not already have one. . Talk to your doctor about regular cancer screening.

## 2015-01-11 ENCOUNTER — Other Ambulatory Visit: Payer: 59

## 2015-01-21 ENCOUNTER — Ambulatory Visit
Admission: RE | Admit: 2015-01-21 | Discharge: 2015-01-21 | Disposition: A | Discharge status: Home / Self Care | Payer: 59 | Source: Ambulatory Visit | Attending: Nurse Practitioner | Admitting: Nurse Practitioner

## 2015-01-21 ENCOUNTER — Telehealth: Payer: Self-pay | Admitting: Nurse Practitioner

## 2015-01-21 DIAGNOSIS — R234 Changes in skin texture: Secondary | ICD-10-CM

## 2015-01-21 NOTE — Telephone Encounter (Signed)
Called and spoke with patient regarding recent mammogram results. Will continue to monitor and report if symptoms worsen or fail to improve.  She states that she feels as if if may be a bit better than it was at her recent office visit. She began using shea butter cream to the skin following that visit with some improvement.  Will continue to monitor at this time.  Pt voiced understanding and agreement with this plan.

## 2015-01-22 NOTE — Progress Notes (Signed)
Called and discussed results of diagnostic mammogram with patient on 01/21/2015.  Please see that encounter for more details.

## 2015-01-24 ENCOUNTER — Other Ambulatory Visit: Payer: Self-pay | Admitting: Nurse Practitioner

## 2015-01-24 ENCOUNTER — Telehealth: Payer: Self-pay | Admitting: Nurse Practitioner

## 2015-01-24 NOTE — Telephone Encounter (Signed)
S.W. PT AND ADVISED ON mARCH 2017 APPT....PT OK AND AWARE

## 2015-02-26 ENCOUNTER — Other Ambulatory Visit: Payer: Self-pay | Admitting: Nurse Practitioner

## 2015-02-26 DIAGNOSIS — Z9889 Other specified postprocedural states: Secondary | ICD-10-CM

## 2015-02-26 DIAGNOSIS — Z853 Personal history of malignant neoplasm of breast: Secondary | ICD-10-CM

## 2015-02-27 ENCOUNTER — Encounter: Payer: Self-pay | Admitting: Nurse Practitioner

## 2015-03-05 ENCOUNTER — Other Ambulatory Visit: Payer: Self-pay

## 2015-03-05 ENCOUNTER — Other Ambulatory Visit: Payer: 59

## 2015-03-05 ENCOUNTER — Ambulatory Visit
Admission: RE | Admit: 2015-03-05 | Discharge: 2015-03-05 | Disposition: A | Discharge status: Home / Self Care | Payer: 59 | Source: Ambulatory Visit | Attending: Nurse Practitioner | Admitting: Nurse Practitioner

## 2015-03-05 VITALS — BP 128/78 | HR 96 | Resp 14 | Ht 63.0 in | Wt 198.0 lb

## 2015-03-05 DIAGNOSIS — Z9889 Other specified postprocedural states: Secondary | ICD-10-CM

## 2015-03-05 DIAGNOSIS — Z853 Personal history of malignant neoplasm of breast: Secondary | ICD-10-CM

## 2015-03-05 DIAGNOSIS — R7303 Prediabetes: Secondary | ICD-10-CM

## 2015-03-05 LAB — POCT GLYCOSYLATED HEMOGLOBIN (HGB A1C): Hemoglobin A1C: 6.3

## 2015-03-05 NOTE — Patient Outreach (Signed)
[facility]) Care Management   03/05/2015  Tracy Barnes Mar 12, 1956 [id number]  Tracy Barnes is an 59 y.o. female.   Member seen for follow up office visit for Link to Wellness program for self management of pre-diabetes  Subjective: member states she is trying to watch her diet and portions.  States she does drink regular ginger ale several times a week.  States she is walking at the hospital for exercise.  States she is checking her blood sugars weekly and they are ranging 122-135 fasting.  Objective:   Review of Systems  All other systems reviewed and are negative. Member did not bring glucometer to visit POC HemoglobinA1C-6.3  Physical Exam Today's Vitals   03/05/15 1536  BP: 128/78  Pulse: 96  Resp: 14  Height: 1.6 m (5\' 3" )  Weight: 198 lb (89.812 kg)  SpO2: 99%  PainSc: 0-No pain   Current Medications:   Current Outpatient Prescriptions  Medication Sig Dispense Refill  . ALPRAZolam (XANAX) 0.25 MG tablet Take 0.25 mg by mouth at bedtime as needed.      . desvenlafaxine (PRISTIQ) 100 MG 24 hr tablet Take 100 mg by mouth daily.    . fluticasone (FLONASE) 50 MCG/ACT nasal spray Place 2 sprays into both nostrils daily. Pt uses as needed.    . hydrochlorothiazide (HYDRODIURIL) 12.5 MG tablet Take 12.5 mg by mouth daily.    Marland Kitchen levothyroxine (SYNTHROID, LEVOTHROID) 100 MCG tablet Take 100 mcg by mouth daily before breakfast.    . metFORMIN (GLUCOPHAGE) 500 MG tablet Take 500 mg by mouth 2 (two) times daily with a meal.     . pantoprazole (PROTONIX) 40 MG tablet Take 40 mg by mouth 2 (two) times daily.    Marland Kitchen zolpidem (AMBIEN) 5 MG tablet TAKE 1 TABLET BY MOUTH AT BEDTIME AS NEEDED FOR SLEEP (Patient not taking: Reported on 03/05/2015) 30 tablet 0   No current facility-administered medications for this visit.    Functional Status:   In your present state of health, do you have any difficulty performing the following activities: 03/05/2015 10/08/2014   Hearing? N N  Vision? N N  Difficulty concentrating or making decisions? N -  Walking or climbing stairs? N N  Dressing or bathing? N N  Doing errands, shopping? N N  Preparing Food and eating ? - N  Using the Toilet? - N  In the past six months, have you accidently leaked urine? - N  Do you have problems with loss of bowel control? - N  Managing your Medications? - N  Managing your Finances? - N  Housekeeping or managing your Housekeeping? - N    Fall/Depression Screening:    PHQ 2/9 Scores 03/05/2015 10/08/2014  PHQ - 2 Score 0 0    Assessment:   Member seen for follow up office visit for Link to Wellness program for self management of  Pre-diabetes.  Member is maintaining hemoglobin A1C goal of 6.4 or less with reading 6.3 today.  Reports adhering to lower CHO diet other than drinking a regular ginger ale several times a week.  She reports walking 3 times a week for 20-30 minutes.  Weight is up 4.2 lb since last visit.    Plan:  1. Plan to walk 25-30 minutes 5 times a week at work. Consider getting an activity tracker 2. Plan to check blood sugar once a week with goals of less than 100 fasting and less than 140 2 hours after eating 3. Plan to  limit carbohydrates to 2-3 (30-45) gm servings a meal and 15 gms at snacks.  Try to have protein with snacks.  Try to avoid sugar drinks 4. Plan to see Dr. Forde Dandy in March 5. Plan to return to Link to Wellness on 08/26/15 at 3:30PM at [place] office  [place] Problem One        Most Recent Value   Care Plan Problem One  Potentilal for elevated blood sugars related to dx of prediabtes   Role Documenting the Problem One  Care Management [place] for Problem One  Active   THN Long Term Goal (31-90 days)  Member will maintain hemoglobin A1C below 6.5 for the next 90 days   THN Long Term Goal Start Date  03/05/15 [hemoglobin A1C 6.3 today]   Interventions for Problem One Long Term Goal  Reviewed CHO counting and  portion control,Reinforced to try to lose weight, Discussed using a fitness tracker to help increase activity, Instructed to try to avoid drinks with sugar and soda, Reviewed numbers and goals for hemoglobin A1C and blood sugars, Reinforced to try to eat protein with snacks , Reinforced importance  of regular exercise for  glycemic control, Instructed to keep appointment with Dr.South in March    Peter Garter RN, [facility] Care Management Coordinator-Link to [place] Management [phone]

## 2015-03-05 NOTE — Patient Instructions (Signed)
1. Plan to walk 25-30 minutes 5 times a week at work. Consider getting an activity tracker 2. Plan to check blood sugar once a week with goals of less than 100 fasting and less than 140 2 hours after eating 3. Plan to limit carbohydrates to 2-3 (30-45) gm servings a meal and 15 gms at snacks.  Try to have protein with snacks.  Try to avoid sugar drinks 4. Plan to see Dr. Forde Dandy in March 5. Plan to return to [facility] on 08/26/15 at 3:30PM at [place] office

## 2015-03-18 ENCOUNTER — Telehealth: Payer: Self-pay | Admitting: Nurse Practitioner

## 2015-03-18 NOTE — Telephone Encounter (Signed)
Called and spoke with Tracy Barnes about email message that came in while this provider was on vacation 02/27/15. Apology made for delayed response and shared that I will investigate what happened with routing of message. Discussed need for 6 month follow up appointment schedule at this time based on history of breast cancer.  Will move next appointment from March 2017 to April 2017, and attempt to coordinate Fall 2017 appointment with scheduling of annual mammogram in November 2017, barring any new symptom or change in patient condition.  Will mail patient copy of new calendar.  Tracy Barnes had no questions at this time.

## 2015-04-01 ENCOUNTER — Encounter: Payer: Self-pay | Admitting: Nurse Practitioner

## 2015-04-23 ENCOUNTER — Other Ambulatory Visit: Payer: Self-pay | Admitting: Family Medicine

## 2015-04-23 DIAGNOSIS — K219 Gastro-esophageal reflux disease without esophagitis: Secondary | ICD-10-CM

## 2015-04-23 MED FILL — METFORMIN HCL ER 500 MG TAB: 500 | 30 days supply | Qty: 90 | Fill #3

## 2015-04-23 MED FILL — LEVOTHYROXINE 100 MCG TAB: 100 | 30 days supply | Qty: 30 | Fill #2

## 2015-04-23 MED FILL — ALPRAZolam 0.25 MG TABS: 0.25 | 30 days supply | Qty: 60 | Fill #1

## 2015-04-23 MED FILL — ATORVASTATIN 20 MG TABLET: 20 | 60 days supply | Qty: 60 | Fill #2

## 2015-04-23 MED FILL — HYDROCHLOROTHIAZIDE 12.5 MG: 12.5 | 90 days supply | Qty: 90 | Fill #1

## 2015-05-06 DIAGNOSIS — H524 Presbyopia: Secondary | ICD-10-CM | POA: Diagnosis not present

## 2015-05-06 DIAGNOSIS — H5213 Myopia, bilateral: Secondary | ICD-10-CM | POA: Diagnosis not present

## 2015-05-06 DIAGNOSIS — H10413 Chronic giant papillary conjunctivitis, bilateral: Secondary | ICD-10-CM | POA: Diagnosis not present

## 2015-05-06 DIAGNOSIS — H1089 Other conjunctivitis: Secondary | ICD-10-CM | POA: Diagnosis not present

## 2015-05-06 DIAGNOSIS — E119 Type 2 diabetes mellitus without complications: Secondary | ICD-10-CM | POA: Diagnosis not present

## 2015-05-06 DIAGNOSIS — H52223 Regular astigmatism, bilateral: Secondary | ICD-10-CM | POA: Diagnosis not present

## 2015-05-20 DIAGNOSIS — H52223 Regular astigmatism, bilateral: Secondary | ICD-10-CM | POA: Diagnosis not present

## 2015-05-20 DIAGNOSIS — H10412 Chronic giant papillary conjunctivitis, left eye: Secondary | ICD-10-CM | POA: Diagnosis not present

## 2015-05-20 DIAGNOSIS — E119 Type 2 diabetes mellitus without complications: Secondary | ICD-10-CM | POA: Diagnosis not present

## 2015-05-20 DIAGNOSIS — H5213 Myopia, bilateral: Secondary | ICD-10-CM | POA: Diagnosis not present

## 2015-05-20 DIAGNOSIS — H524 Presbyopia: Secondary | ICD-10-CM | POA: Diagnosis not present

## 2015-06-12 ENCOUNTER — Other Ambulatory Visit: Payer: Self-pay | Admitting: Family Medicine

## 2015-06-12 MED FILL — ALPRAZolam 0.25 MG TABS: 0.25 | 30 days supply | Qty: 60 | Fill #2

## 2015-06-12 MED FILL — PRISTIQ ER 100 MG TABLET: 100 | 90 days supply | Qty: 90 | Fill #2

## 2015-06-12 MED FILL — METFORMIN HCL ER 500 MG TAB: 500 | 30 days supply | Qty: 90 | Fill #4

## 2015-06-13 MED FILL — PANTOPRAZOLE SOD DR 40 MG T: 40 | 90 days supply | Qty: 180 | Fill #0

## 2015-06-17 MED FILL — ZOLPIDEM TARTRATE 5 MG TAB: 5 | 90 days supply | Qty: 90 | Fill #0

## 2015-06-18 DIAGNOSIS — E669 Obesity, unspecified: Secondary | ICD-10-CM | POA: Diagnosis not present

## 2015-06-18 DIAGNOSIS — E119 Type 2 diabetes mellitus without complications: Secondary | ICD-10-CM | POA: Diagnosis not present

## 2015-06-18 DIAGNOSIS — F329 Major depressive disorder, single episode, unspecified: Secondary | ICD-10-CM | POA: Diagnosis not present

## 2015-06-18 DIAGNOSIS — E039 Hypothyroidism, unspecified: Secondary | ICD-10-CM | POA: Diagnosis not present

## 2015-06-18 DIAGNOSIS — I10 Essential (primary) hypertension: Secondary | ICD-10-CM | POA: Diagnosis not present

## 2015-06-18 DIAGNOSIS — R300 Dysuria: Secondary | ICD-10-CM | POA: Diagnosis not present

## 2015-06-18 DIAGNOSIS — E784 Other hyperlipidemia: Secondary | ICD-10-CM | POA: Diagnosis not present

## 2015-06-18 DIAGNOSIS — R0600 Dyspnea, unspecified: Secondary | ICD-10-CM | POA: Diagnosis not present

## 2015-06-18 DIAGNOSIS — D860 Sarcoidosis of lung: Secondary | ICD-10-CM | POA: Diagnosis not present

## 2015-06-24 ENCOUNTER — Encounter: Payer: 59 | Admitting: Nurse Practitioner

## 2015-06-24 ENCOUNTER — Ambulatory Visit (HOSPITAL_BASED_OUTPATIENT_CLINIC_OR_DEPARTMENT_OTHER): Payer: 59 | Admitting: Nurse Practitioner

## 2015-06-24 ENCOUNTER — Encounter: Payer: Self-pay | Admitting: Nurse Practitioner

## 2015-06-24 ENCOUNTER — Telehealth: Payer: Self-pay | Admitting: Nurse Practitioner

## 2015-06-24 VITALS — BP 146/86 | HR 88 | Temp 97.8°F | Resp 18 | Ht 63.0 in | Wt 198.3 lb

## 2015-06-24 DIAGNOSIS — C50912 Malignant neoplasm of unspecified site of left female breast: Secondary | ICD-10-CM

## 2015-06-24 DIAGNOSIS — Z853 Personal history of malignant neoplasm of breast: Secondary | ICD-10-CM | POA: Diagnosis not present

## 2015-06-24 NOTE — Telephone Encounter (Signed)
Pt came in today for her [facility] SCP visit today that was r/s from 3/6 to 4/3, but stated she did not receive the phone call or email that was sent in Nov/Dec of 2016. Pt stated because she was already here, that she wanted to see Nira Conn today. Darlena spoke with Nira Conn, confirming that it was okay to see the pt today. R/s the appt from 4/3 to 3/6.

## 2015-06-24 NOTE — Patient Instructions (Addendum)
So good to see you and appreciate your patience and flexibility!  Please continue to perform your self breast exam and report any changes.  You will be due bilateral mammogram this November.  We'll get that ordered and have you come back and see me in 8 months (after the mammogram).  Please let me know if you have any changes or new problems.  Keep me posted - will be thinking of you!   Symptoms to Watch for and Report to Your Provider  . Return of the cancer symptoms you had before- such as a lump or new growth where your cancer first started . New or unusual pain that seems unrelated to an injury and does not go away, including back pain or bone pain . Weight loss without trying/intending . Unexplained bleeding . A rash or allergic reaction, such as swelling, severe itching or wheezing . Chills or fevers . Persistent headaches . Shortness of breath or difficulty breathing . Bloody stools or blood in your urine . Lumps, bumps, swelling and/or nipple discharge . Nausea, vomiting, diarrhea, loss of appetite, or trouble swallowing . A cough that doesn't go away . Abdominal pain . Swelling in your arms or legs . Fractures . Hot flashes or other menopausal symptoms . Any other signs mentioned by your doctor or nurse or any unusual symptoms                 that you just can't explain   NOTE: Just because you have certain symptoms, it doesn't mean the cancer has come back or you have a new cancer. Symptoms can be due to other problems that need to be addressed.  It is important to watch for these symptoms and report them to your provider so you can be medically evaluated for any of these concerns!    Living a Life of Wellness After Cancer:  *Note: Please consult your health care provider before using any medications, supplements, over-the-counter products, or other interventions.  Also, please consult your primary care provider before you begin any lifestyle program (diet, exercise, etc.).  Your  safety is our top priority and we want to make sure you continue to live a long and healthy life!    Healthy Lifestyle Recommendations  As a cancer survivor, it is important develop a lifelong commitment to a healthy lifestyle. A healthy lifestyle can prevent cancer from returning as well as prevent other diseases like heart disease, diabetes and high blood pressure.  These are some things that you can do to have a healthy lifestyle:  Marland Kitchen Maintain a healthy weight.  . Exercise daily per your doctor's orders. . Eat a balanced diet high in fruits, vegetables, bran, and fiber. Limit intake of red meat      and processed foods.  . Limit how much alcohol you consume, if at all. Ali Lowe regular bone mineral density testing for osteoporosis.  . Talk to your doctor about cardiovascular disease or "heart disease" screening. . Stop smoking (if you smoke). . Know your family history. . Be mindful of your emotional, social, and spiritual needs. . Meet regularly with a Primary Care Provider (PCP). Find a PCP if you do not             already have one. . Talk to your doctor about regular cancer screening including screening for colon           cancer, GYN cancers, and skin cancer.

## 2015-06-24 NOTE — Progress Notes (Signed)
CLINIC:  Cancer Survivorship   REASON FOR VISIT:  Routine follow-up post-treatment for history of breast cancer.  BRIEF ONCOLOGIC HISTORY:    Breast cancer, left breast (Tracy Barnes)   02/07/2010 Mammogram Left breast: calcifications; further imaging needed.   02/19/2010 Breast US Left breast: Two groups of suspicious microcalcifications in the inner lower quadrant.   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 Right breast unremarkable.   02/25/2010 Clinical Stage Stage 1: T1 N0   03/25/2010 Definitive Surgery Left breast lumpectomy: High-grade DCIS with calcifications, 1.5 cm, 4 benign lymph nodes. No invasive disease noted at time of resection.   03/25/2010 Pathologic Stage Stage IA: T1 N0   04/16/2010 - 04/17/2011 Chemotherapy Adjuvant chemotherapy Tracy Barnes): Taxol/ Herceptin x 9 cycles followed by 1 year Herceptin maintenance completed 04/17/2011   06/18/2010 - 08/04/2010 Radiation Therapy Adjuvant RT completed Tracy Barnes). Left breast: 45 Gy over 25 fractions; Left breast boost 16 Gy over 8 fractions.  Total dose: 60 Gy    INTERVAL HISTORY:  Tracy Barnes presents to the [facility] today for ongoing follow up regarding her history of breast cancer. Overall, Tracy Barnes reports feeling doing very well since her last visit in September 2016. She reports that the itching she experienced in her left breast last fall has disappeared completely. She used some cortisone over the areas of itching following her last visit here and this resolved her symptoms. She has not noticed any change within her breast and her last mammogram was in November 2016 and stable.  She denies any headache, cough, increased shortness of breath, or bone pain.  She is scheduled to see Dr. Chase Barnes later this week for follow up of her sarcoid.  She reports a good appetite and denies any weight loss.  Of note, her brother recently was found to have further metastasis of his lung cancer with lesions in his liver and hip, and is completing palliative RT to his hip this week.  She is understandably upset by this news.  REVIEW OF SYSTEMS:  General: Denies fever, chills, unintentional weight loss, or generalized fatigue.  HEENT: Recent eye exam unremarkable aside from floaters, stable.  Otherwise, denies visual changes, hearing loss, mouth sores, or difficulty swallowing. Cardiac: Denies palpitations and lower extremity edema.  Respiratory: Shortness of breath, as above, related to her sarcoid. Breast: Denies any new nodularity, masses, tenderness, nipple changes, or nipple discharge.  GI: Denies abdominal pain, constipation, diarrhea, nausea, or vomiting.  GU: Denies dysuria, hematuria, vaginal bleeding, vaginal discharge, or vaginal dryness.  Musculoskeletal: Occasional joint pain, otherwise, denies ongoing bone pain.  Neuro: Denies recent fall or numbness / tingling in her extremities.  Skin: Denies rash, pruritis, or open wounds.  Psych: Denies depression, anxiety, insomnia, or memory loss.   A 14-point review of systems was completed and was negative, except as noted above.   ONCOLOGY TREATMENT TEAM:  1. Surgeon:  [facility]  2. Medical Oncologist: Dr. Lindi Barnes 3. Radiation Oncologist: Dr. Pablo Barnes    PAST MEDICAL/SURGICAL HISTORY:  Past Medical History  Diagnosis Date  . Cancer ([place]) 02/19/2010    left breast  . Hx of radiation therapy 06/18/10 to 08/01/10    L breast  . Diabetes mellitus   . Hypertension   .  Hyperlipidemia   . Dyspnea on exertion   . Chest pain   . GERD (gastroesophageal reflux disease)    Past Surgical History  Procedure Laterality Date  . Abdominal hysterectomy      At 40 yrs of age. One  ovary removed. Surgery due to fibroids per medical history form dated 02/25/10.  . Breast surgery  03/25/2010    left lumpectomy  . Deep axillary sentinel node biopsy / excision  03/25/2010    4/4 nodes negative  . Portacath placement  04/18/2010    Dr. Fanny Barnes  . Port-a-cath removal  06/05/2011    Procedure: MINOR REMOVAL PORT-A-CATH;  Surgeon: Tracy Hector, MD;  Location: [place];  Service: General;  Laterality: N/A;  right  . Video bronchoscopy with endobronchial ultrasound N/A 12/27/2013    Procedure: VIDEO BRONCHOSCOPY WITH ENDOBRONCHIAL ULTRASOUND;  Surgeon: Tracy Nakayama, MD;  Location: [place];  Service: Thoracic;  Laterality: N/A;  . Mediastinoscopy N/A 12/27/2013    Procedure: MEDIASTINOSCOPY;  Surgeon: Tracy Nakayama, MD;  Location: [place];  Service: Thoracic;  Laterality: N/A;     ALLERGIES:  Allergies  Allergen Reactions  . Codeine Itching    Updated per Mosaiq.     CURRENT MEDICATIONS:  Current Outpatient Prescriptions on File Prior to Visit  Medication Sig Dispense Refill  . ALPRAZolam (XANAX) 0.25 MG tablet Take 0.25 mg by mouth at bedtime as needed.      . desvenlafaxine (PRISTIQ) 100 MG 24 hr tablet Take 100 mg by mouth daily.    . fluticasone (FLONASE) 50 MCG/ACT nasal spray Place 2 sprays into both nostrils daily as needed.    . hydrochlorothiazide (HYDRODIURIL) 12.5 MG tablet Take 12.5 mg by mouth daily.    Marland Kitchen levothyroxine (SYNTHROID, LEVOTHROID) 100 MCG tablet Take 100 mcg by mouth daily before breakfast.    . metFORMIN (GLUCOPHAGE) 500 MG tablet Take 500 mg by mouth 2 (two) times daily with a meal.     . pantoprazole (PROTONIX) 40 MG tablet TAKE 1 TABLET BY MOUTH ONCE DAILY 90 tablet 1  . zolpidem (AMBIEN) 5 MG tablet TAKE 1 TABLET BY MOUTH AT BEDTIME AS NEEDED FOR SLEEP (Patient not taking: Reported on 03/05/2015) 30 tablet 0   No current facility-administered medications on file prior to visit.     ONCOLOGIC FAMILY  HISTORY:  Family History  Problem Relation Age of Onset  . Cancer Mother     Cancer of bladder and brain per medical history form dated 02/25/10.  Marland Kitchen Heart disease Father   . COPD Father   . Aortic stenosis Father     SOCIAL HISTORY:  Tracy Barnes is widowed and lives alone in [place], [place].  She is expecting her fourth grandchild, Lovena Le, later this month. 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  She is a former smoker, having quit in 1979, and denies any alcohol or drug use.   PHYSICAL EXAMINATION:  Vital Signs: Filed Vitals:   06/24/15 1321  BP: 146/86  Pulse: 88  Temp: 97.8 F (36.6 C)  Resp: 18   ECOG performance status:0 General: Well-nourished, well-appearing female in no acute distress.  She is unaccompanied in clinic today.   HEENT: Head is atraumatic and normocephalic.  Pupils equal and reactive to light and accomodation. Conjunctivae clear without exudate.  Sclerae anicteric. Oral mucosa is pink, moist, and intact without lesions.  Oropharynx is pink without lesions or erythema.  Lymph:  No cervical, supraclavicular, infraclavicular, or axillary lymphadenopathy noted on palpation.  Cardiovascular: Regular rate and rhythm without murmurs, rubs, or gallops. Respiratory: Clear to auscultation bilaterally. Chest expansion symmetric without accessory muscle use on inspiration or expiration.  Breast: Bilateral breast exam performed.  Left breast remains thickened throughout without mass or nodule, consistent with radiation changes.  No tenderness to palpation. Right breast without mass or nodularity.   GI: Abdomen soft and round. No tenderness to palpation. Bowel sounds normoactive in 4 quadrants. No hepatosplenomegaly.   GU: Deferred.  Musculoskeletal: Muscle strength 5/5 in all extremities.   Neuro: No focal deficits. Steady gait.  Psych: Mood and affect normal and appropriate for situation.  Extremities: No edema,  cyanosis, or clubbing.  Skin: Warm and dry. No open lesions noted.   LABORATORY DATA:  No results found for this or any previous visit (from the past 2160 hour(s)).  DIAGNOSTIC IMAGING: Bilateral diagnostic mammogram performed 03/05/2015 reveals breast density category b.  Posttreatment changes noted in central left breast with no evidence of malignancy in either breast.     ASSESSMENT AND PLAN:   1. 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 Tracy Barnes is doing well with no clinical symptoms worrisome for cancer recurrence at this time. I have reviewed the recommendations for ongoing surveillance with her and she will follow-up in 8 months time with history and physical examination following her annual mammogram in November 2017.  At that point, if clinically indicated, she will advance to one year visits.  She was instructed to notify us if she notes any change within her breast, any new symptoms such as pain, shortness of breath, weight loss, or fatigue.    2. Sarcoid: Tracy Barnes will see Dr. Chase Barnes this week for follow up. She is not experiencing symptoms at today's visit.  3. Cancer screening:  Due to Tracy Barnes's history and her age, she should receive screening for skin cancers, colon cancer, and gynecologic cancers.  The information and recommendations were shared with the patient and in her written after visit summary.     A total of 30 minutes of face-to-face time was spent with this patient with greater than 50% of that time in counseling and care-coordination.   Sylvan Cheese, NP  Survivorship Program [facility] [phone]   Note: PRIMARY CARE PROVIDER Sheela Stack, [place] [phone]

## 2015-06-25 ENCOUNTER — Telehealth: Payer: Self-pay | Admitting: Hematology and Oncology

## 2015-06-25 ENCOUNTER — Ambulatory Visit (INDEPENDENT_AMBULATORY_CARE_PROVIDER_SITE_OTHER): Payer: 59 | Admitting: Internal Medicine

## 2015-06-25 ENCOUNTER — Encounter: Payer: Self-pay | Admitting: Internal Medicine

## 2015-06-25 VITALS — BP 144/90 | HR 82 | Ht 63.0 in | Wt 199.2 lb

## 2015-06-25 DIAGNOSIS — R0989 Other specified symptoms and signs involving the circulatory and respiratory systems: Secondary | ICD-10-CM | POA: Diagnosis not present

## 2015-06-25 DIAGNOSIS — R911 Solitary pulmonary nodule: Secondary | ICD-10-CM | POA: Diagnosis not present

## 2015-06-25 DIAGNOSIS — R0600 Dyspnea, unspecified: Secondary | ICD-10-CM | POA: Diagnosis not present

## 2015-06-25 DIAGNOSIS — R590 Localized enlarged lymph nodes: Secondary | ICD-10-CM

## 2015-06-25 DIAGNOSIS — R599 Enlarged lymph nodes, unspecified: Secondary | ICD-10-CM

## 2015-06-25 NOTE — Telephone Encounter (Signed)
Spoke with patient to confirm appt date/time for November

## 2015-06-25 NOTE — Patient Instructions (Addendum)
#  Shortness of breath with crackles  -still unclear  - given crackles will get HRCT chest ; May 2017 - refer pulmonary rehab at [facility]  #Lung nodules - fall 2015  And mediastinal granuloma - [id number] - repeat ct chest with contrast in May 2017  #Followup  - May 2017 after complteeing CT chest in may 2017

## 2015-06-25 NOTE — Progress Notes (Signed)
Subjective:     Patient ID: Tracy Barnes, female   DOB: 1955-08-04, 60 y.o.   MRN: [id number]  HPI  OV 06/25/2015  Chief Complaint  Patient presents with  . Follow-up    Pt states her breathing has not changed since last OV in 02/2014. Pt states she is aware of a couple spots on her lungs and would like the spots re-evaluated. Pt c/o chest tightness when SOB - resolves with rest. Pt denies cough.    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 She now presents for dyspnea and follow-up of the mediastinal nodes. Last seen in the fall of 2015. Since then she says she has seen cardiology and his been cleared but dyspnea persists. She has not been to pulmonary rehabilitation and seems interested. In terms of her breast cancer she is now on the survivors clinic. In terms of pulmonary nodules she had a follow-up CT chest May 2016 and it was stable. 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 This was also stable on the noncontrast CT chest in May 2016. However she says she wants follow-up CT chest because of persistent dyspnea and midsternal granuloma. Recently in November 2060 and her brother got diagnosed at onset/outset with stage IV lung cancer. He quit smoking 25 years ago. Therefore she is extremely worried given her previous smoking history and medical problems.  Today walking desaturation test 185 feet 3 laps on room air: stayed 100% all 3 laps       has a past medical history of Cancer ([place]) (02/19/2010); radiation therapy (06/18/10 to 08/01/10); Diabetes mellitus; Hypertension; Hyperlipidemia; Dyspnea on exertion; Chest pain; and GERD (gastroesophageal reflux disease).   reports that she quit smoking about 38 years ago. Her smoking use included Cigarettes. She has a .05 pack-year smoking history. She has never used smokeless tobacco.  Past Surgical History  Procedure Laterality Date  . Abdominal hysterectomy      At 27 yrs of age. One ovary removed. Surgery  due to fibroids per medical history form dated 02/25/10.  . Breast surgery  03/25/2010    left lumpectomy  . Deep axillary sentinel node biopsy / excision  03/25/2010    4/4 nodes negative  . Portacath placement  04/18/2010    Dr. Fanny Skates  . Port-a-cath removal  06/05/2011    Procedure: MINOR REMOVAL PORT-A-CATH;  Surgeon: Adin Hector, MD;  Location: [place];  Service: General;  Laterality: N/A;  right  . Video bronchoscopy with endobronchial ultrasound N/A 12/27/2013    Procedure: VIDEO BRONCHOSCOPY WITH ENDOBRONCHIAL ULTRASOUND;  Surgeon: [place] Nakayama, MD;  Location: [place];  Service: Thoracic;  Laterality: N/A;  . Mediastinoscopy N/A 12/27/2013    Procedure: MEDIASTINOSCOPY;  Surgeon: [place] Nakayama, MD;  Location: [place];  Service: Thoracic;  Laterality: N/A;    Allergies  Allergen Reactions  . Codeine Itching    Updated per Mosaiq.    Immunization History  Administered Date(s) Administered  . Influenza Split 01/18/2013, 01/08/2014  . Influenza,inj,Quad PF,36+ Mos 01/19/2015    Family History  Problem Relation Age of Onset  . Cancer Mother     Cancer of bladder and brain per medical history form dated 02/25/10.  Marland Kitchen Heart disease Father   . COPD Father   . Aortic stenosis Father      Current outpatient prescriptions:  .  ALPRAZolam (XANAX) 0.25 MG tablet, Take 0.25 mg by mouth at bedtime as needed.  , Disp: ,  Rfl:  .  ATORVASTATIN CALCIUM PO, Take by mouth daily., Disp: , Rfl:  .  desvenlafaxine (PRISTIQ) 100 MG 24 hr tablet, Take 100 mg by mouth daily., Disp: , Rfl:  .  fluticasone (FLONASE) 50 MCG/ACT nasal spray, Place 2 sprays into both nostrils daily as needed., Disp: , Rfl:  .  hydrochlorothiazide (HYDRODIURIL) 12.5 MG tablet, Take 12.5 mg by mouth daily., Disp: , Rfl:  .  levothyroxine (SYNTHROID, LEVOTHROID) 100 MCG tablet, Take 100 mcg by mouth daily before breakfast., Disp: , Rfl:  .  metFORMIN (GLUCOPHAGE) 500 MG tablet, Take  500 mg by mouth 2 (two) times daily with a meal. , Disp: , Rfl:  .  pantoprazole (PROTONIX) 40 MG tablet, TAKE 1 TABLET BY MOUTH ONCE DAILY, Disp: 90 tablet, Rfl: 1 .  zolpidem (AMBIEN) 5 MG tablet, TAKE 1 TABLET BY MOUTH AT BEDTIME AS NEEDED FOR SLEEP, Disp: 30 tablet, Rfl: 0     Review of Systems According to history of present illness    Objective:   Physical Exam  Constitutional: She is oriented to person, place, and time. She appears well-developed and well-nourished. No distress.  HENT:  Head: Normocephalic and atraumatic.  Right Ear: External ear normal.  Left Ear: External ear normal.  Mouth/Throat: Oropharynx is clear and moist. No oropharyngeal exudate.  Eyes: Conjunctivae and EOM are normal. Pupils are equal, round, and reactive to light. Right eye exhibits no discharge. Left eye exhibits no discharge. No scleral icterus.  Neck: Normal range of motion. Neck supple. No JVD present. No tracheal deviation present. No thyromegaly present.  Cardiovascular: Normal rate, regular rhythm, normal heart sounds and intact distal pulses.  Exam reveals no gallop and no friction rub.   No murmur heard. Pulmonary/Chest: Effort normal. No respiratory distress. She has no wheezes. She has rales. She exhibits no tenderness.  Crackles+  Abdominal: Soft. Bowel sounds are normal. She exhibits no distension and no mass. There is no tenderness. There is no rebound and no guarding.  Musculoskeletal: Normal range of motion. She exhibits no edema or tenderness.  Lymphadenopathy:    She has no cervical adenopathy.  Neurological: She is alert and oriented to person, place, and time. She has normal reflexes. No cranial nerve deficit. She exhibits normal muscle tone. Coordination normal.  Skin: Skin is warm and dry. No rash noted. She is not diaphoretic. No erythema. No pallor.  Psychiatric: She has a normal mood and affect. Her behavior is normal. Judgment and thought content normal.  Vitals  reviewed.   Filed Vitals:   06/25/15 1024  BP: 144/90  Pulse: 82  Height: 5\' 3"  (1.6 m)  Weight: 199 lb 3.2 oz (90.357 kg)  SpO2: 97%        Assessment:       ICD-9-CM ICD-10-CM   1. Dyspnea 786.09 R06.00   2. Lung nodule 793.11 R91.1   3. Mediastinal adenopathy 785.6 R59.9   4. Bibasilar crackles 786.7 R09.89        Plan:     #Shortness of breath with crackles  -still unclear  - given crackles will get HRCT chest ; May 2017 - rule out interim dev of ILD - refer pulmonary rehab at [facility]  #Lung nodules - fall 2015  And mediastinal granuloma - [id number] - repeat ct chest with contrast in May 2017  #Followup  - May 2017 after complteeing CT chest in may 2017  Dr. Brand Males, M.D., F.C.C.P Pulmonary and Critical Care Medicine Staff Physician [place]  [facility] Pager: [phone], If no answer or between  15:00h - 7:00h: call [phone]  06/25/2015 11:06 AM

## 2015-06-28 MED FILL — ATORVASTATIN 20 MG TABLET: 20 | 90 days supply | Qty: 90 | Fill #0

## 2015-06-28 MED FILL — LEVOTHYROXINE 100 MCG TAB: 100 | 30 days supply | Qty: 30 | Fill #0

## 2015-07-11 ENCOUNTER — Telehealth: Payer: Self-pay | Admitting: Internal Medicine

## 2015-07-11 NOTE — Telephone Encounter (Signed)
Called and spoke to pt. Informed her of the recs per MR. Appt scheduled for 4/4. Pt verbalized understanding and denied any further questions or concerns at this time.

## 2015-07-11 NOTE — Telephone Encounter (Signed)
Give fu appt for Tracy Barnes earlier than 08/28/15 - ebus with Dr Roxan Hockey suggests sarcoid

## 2015-07-15 ENCOUNTER — Encounter (HOSPITAL_COMMUNITY): Payer: Self-pay

## 2015-07-15 ENCOUNTER — Encounter (HOSPITAL_COMMUNITY)
Admission: RE | Admit: 2015-07-15 | Discharge: 2015-07-15 | Disposition: A | Discharge status: Home / Self Care | Payer: 59 | Source: Ambulatory Visit | Attending: Internal Medicine | Admitting: Internal Medicine

## 2015-07-15 VITALS — BP 133/88 | HR 84 | Resp 16 | Ht 63.0 in | Wt 199.5 lb

## 2015-07-15 DIAGNOSIS — R0600 Dyspnea, unspecified: Secondary | ICD-10-CM | POA: Diagnosis not present

## 2015-07-15 HISTORY — DX: Disorder of thyroid, unspecified: E07.9

## 2015-07-15 NOTE — Progress Notes (Signed)
Pulmonary Individual Treatment Plan  Patient Details  Name: Tracy Barnes MRN: [id number] Date of Birth: 17-Oct-1955 Referring Provider:  Brand Males, MD  Initial Encounter Date:   Visit Diagnosis: Dyspnea  Patient's Home Medications on Admission:   Current outpatient prescriptions:  .  ALPRAZolam (XANAX) 0.25 MG tablet, Take 0.25 mg by mouth at bedtime as needed.  , Disp: , Rfl:  .  ATORVASTATIN CALCIUM PO, Take by mouth daily., Disp: , Rfl:  .  desvenlafaxine (PRISTIQ) 100 MG 24 hr tablet, Take 100 mg by mouth daily., Disp: , Rfl:  .  fluticasone (FLONASE) 50 MCG/ACT nasal spray, Place 2 sprays into both nostrils daily as needed., Disp: , Rfl:  .  hydrochlorothiazide (HYDRODIURIL) 12.5 MG tablet, Take 12.5 mg by mouth daily., Disp: , Rfl:  .  levothyroxine (SYNTHROID, LEVOTHROID) 100 MCG tablet, Take 100 mcg by mouth daily before breakfast., Disp: , Rfl:  .  metFORMIN (GLUCOPHAGE) 500 MG tablet, Take 500 mg by mouth 2 (two) times daily with a meal. , Disp: , Rfl:  .  pantoprazole (PROTONIX) 40 MG tablet, TAKE 1 TABLET BY MOUTH ONCE DAILY, Disp: 90 tablet, Rfl: 1 .  zolpidem (AMBIEN) 5 MG tablet, TAKE 1 TABLET BY MOUTH AT BEDTIME AS NEEDED FOR SLEEP, Disp: 30 tablet, Rfl: 0  Past Medical History: Past Medical History  Diagnosis Date  . Cancer ([place]) 02/19/2010    left breast  . Hx of radiation therapy 06/18/10 to 08/01/10    L breast  . Diabetes mellitus   . Hypertension   . Hyperlipidemia   . Dyspnea on exertion   . Chest pain   . GERD (gastroesophageal reflux disease)   . Thyroid disease     Tobacco Use: History  Smoking status  . Former Smoker -- 0.10 packs/day for .5 years  . Types: Cigarettes  . Quit date: 04/20/1977  Smokeless tobacco  . Never Used    Labs: Recent Review Flowsheet Data    Labs for ITP Cardiac and Pulmonary Rehab 03/05/2015   Hemoglobin A1c 6.3      Capillary Blood Glucose: Lab Results  Component Value Date   GLUCAP 154*  12/27/2013   GLUCAP 114* 12/27/2013   GLUCAP 100* 12/27/2013   GLUCAP 108* 12/15/2013     ADL UCSD:   Pulmonary Function Assessment:     Pulmonary Function Assessment - 07/15/15 1016    Breath   Bilateral Breath Sounds Clear   Shortness of Breath Yes;Limiting activity      Exercise Target Goals:    Exercise Program Goal: Individual exercise prescription set with THRR, safety & activity barriers. Participant demonstrates ability to understand and report RPE using BORG scale, to self-measure pulse accurately, and to acknowledge the importance of the exercise prescription.  Exercise Prescription Goal: Starting with aerobic activity 30 plus minutes a day, 3 days per week for initial exercise prescription. Provide home exercise prescription and guidelines that participant acknowledges understanding prior to discharge.  Activity Barriers & Risk Stratification:     Activity Barriers & Cardiac Risk Stratification - 07/15/15 1014    Activity Barriers & Cardiac Risk Stratification   Activity Barriers Deconditioning;Muscular Weakness;Shortness of Breath      6 Minute Walk:   Initial Exercise Prescription:   Perform Capillary Blood Glucose checks as needed.  Exercise Prescription Changes:   Exercise Comments:   Discharge Exercise Prescription (Final Exercise Prescription Changes):    Nutrition:  Target Goals: Understanding of nutrition guidelines, daily intake of sodium 1500mg , cholesterol 200mg ,  calories 30% from fat and 7% or less from saturated fats, daily to have 5 or more servings of fruits and vegetables.  Biometrics:     Pre Biometrics - 07/15/15 1024    Pre Biometrics   Grip Strength 19 kg       Nutrition Therapy Plan and Nutrition Goals:   Nutrition Discharge: Rate Your Plate Scores:   Psychosocial: Target Goals: Acknowledge presence or absence of depression, maximize coping skills, provide positive support system. Participant is able to  verbalize types and ability to use techniques and skills needed for reducing stress and depression.  Initial Review & Psychosocial Screening:     Initial Psych Review & Screening - 07/15/15 1041    Family Dynamics   Good Support System? Yes   Concerns --  no concerns   Barriers   Psychosocial barriers to participate in program There are no identifiable barriers or psychosocial needs.   Screening Interventions   Interventions Encouraged to exercise      Quality of Life Scores:   PHQ-9:     Recent Review Flowsheet Data    Depression screen [facility] 2/9 07/15/2015 03/05/2015 10/08/2014   Decreased Interest 0 0 0   Down, Depressed, Hopeless 0 0 0   PHQ - 2 Score 0 0 0      Psychosocial Evaluation and Intervention:     Psychosocial Evaluation - 07/15/15 1045    Psychosocial Evaluation & Interventions   Interventions Encouraged to exercise with the program and follow exercise prescription      Psychosocial Re-Evaluation:  Education: Education Goals: Education classes will be provided on a weekly basis, covering required topics. Participant will state understanding/return demonstration of topics presented.  Learning Barriers/Preferences:     Learning Barriers/Preferences - 07/15/15 1016    Learning Barriers/Preferences   Learning Barriers None   Learning Preferences Computer/Internet;Verbal Instruction;Written Material      Education Topics: Risk Factor Reduction:  -Group instruction that is supported by a PowerPoint presentation. Instructor discusses the definition of a risk factor, different risk factors for pulmonary disease, and how the heart and lungs work together.     Nutrition for Pulmonary Patient:  -Group instruction provided by PowerPoint slides, verbal discussion, and written materials to support subject matter. The instructor gives an explanation and review of healthy diet recommendations, which includes a discussion on weight management, recommendations  for fruit and vegetable consumption, as well as protein, fluid, caffeine, fiber, sodium, sugar, and alcohol. Tips for eating when patients are short of breath are discussed.   Pursed Lip Breathing:  -Group instruction that is supported by demonstration and informational handouts. Instructor discusses the benefits of pursed lip and diaphragmatic breathing and detailed demonstration on how to preform both.     Oxygen Safety:  -Group instruction provided by PowerPoint, verbal discussion, and written material to support subject matter. There is an overview of "What is Oxygen" and "Why do we need it".  Instructor also reviews how to create a safe environment for oxygen use, the importance of using oxygen as prescribed, and the risks of noncompliance. There is a brief discussion on traveling with oxygen and resources the patient may utilize.   Oxygen Equipment:  -Group instruction provided by [facility] Staff utilizing handouts, written materials, and equipment demonstrations.   Signs and Symptoms:  -Group instruction provided by written material and verbal discussion to support subject matter. Warning signs and symptoms of infection, stroke, and heart attack are reviewed and when to call the physician/911 reinforced. Tips for  preventing the spread of infection discussed.   Advanced Directives:  -Group instruction provided by verbal instruction and written material to support subject matter. Instructor reviews Advanced Directive laws and proper instruction for filling out document.   Pulmonary Video:  -Group video education that reviews the importance of medication and oxygen compliance, exercise, good nutrition, pulmonary hygiene, and pursed lip and diaphragmatic breathing for the pulmonary patient.   Exercise for the Pulmonary Patient:  -Group instruction that is supported by a PowerPoint presentation. Instructor discusses benefits of exercise, core components of exercise, frequency,  duration, and intensity of an exercise routine, importance of utilizing pulse oximetry during exercise, safety while exercising, and options of places to exercise outside of rehab.     Pulmonary Medications:  -Verbally interactive group education provided by instructor with focus on inhaled medications and proper administration.   Anatomy and Physiology of the Respiratory System and Intimacy:  -Group instruction provided by PowerPoint, verbal discussion, and written material to support subject matter. Instructor reviews respiratory cycle and anatomical components of the respiratory system and their functions. Instructor also reviews differences in obstructive and restrictive respiratory diseases with examples of each. Intimacy, Sex, and Sexuality differences are reviewed with a discussion on how relationships can change when diagnosed with pulmonary disease. Common sexual concerns are reviewed.   Knowledge Questionnaire Score:   Core Components/Risk Factors/Patient Goals at Admission:     Personal Goals and Risk Factors at Admission - 07/15/15 1024    Core Components/Risk Factors/Patient Goals on Admission    Weight Management Obesity;Weight Loss;Yes   Admit Weight 198 lb (89.812 kg)   Goal Weight: Short Term 195 lb (88.451 kg)   Expected Outcomes Short Term: Continue to assess and modify interventions until short term weight is achieved;Long Term: Adherence to nutrition and physical activity/exercise program aimed toward attainment of established weight goal   Sedentary Yes   Intervention Provide advice, education, support and counseling about physical activity/exercise needs.;Develop an individualized exercise prescription for aerobic and resistive training based on initial evaluation findings, risk stratification, comorbidities and participant's personal goals.   Expected Outcomes Achievement of increased cardiorespiratory fitness and enhanced flexibility, muscular endurance and strength  shown through measurements of functional capacity and personal statement of participant.   Increase Strength and Stamina Yes   Intervention Provide advice, education, support and counseling about physical activity/exercise needs.;Develop an individualized exercise prescription for aerobic and resistive training based on initial evaluation findings, risk stratification, comorbidities and participant's personal goals.   Expected Outcomes Achievement of increased cardiorespiratory fitness and enhanced flexibility, muscular endurance and strength shown through measurements of functional capacity and personal statement of participant.   Improve shortness of breath with ADL's Yes   Intervention Provide education, individualized exercise plan and daily activity instruction to help decrease symptoms of SOB with activities of daily living.   Expected Outcomes Short Term: Achieves a reduction of symptoms when performing activities of daily living.   Develop more efficient breathing techniques such as purse lipped breathing and diaphragmatic breathing; and practicing self-pacing with activity Yes   Intervention Provide education, demonstration and support about specific breathing techniuqes utilized for more efficient breathing. Include techniques such as pursed lipped breathing, diaphragmatic breathing and self-pacing activity.   Expected Outcomes Short Term: Participant will be able to demonstrate and use breathing techniques as needed throughout daily activities.      Core Components/Risk Factors/Patient Goals Review:      Goals and Risk Factor Review      07/15/15 1036  Core Components/Risk Factors/Patient Goals Review   Personal Goals Review Weight Management/Obesity;Sedentary;Improve shortness of breath with ADL's;Increase Strength and Stamina       Review How to increase strength and stamina, slowly lose weight, reviewed purse lip breathing, will meet with nutritionist soon for one on  one session regarding weight lossa       Expected Outcomes Improved strength and stamina, improved shortness of breath and weight loss of 5 pounds in the near future          Core Components/Risk Factors/Patient Goals at Discharge (Final Review):      Goals and Risk Factor Review - 07/15/15 1036    Core Components/Risk Factors/Patient Goals Review   Personal Goals Review Weight Management/Obesity;Sedentary;Improve shortness of breath with ADL's;Increase Strength and Stamina   Review How to increase strength and stamina, slowly lose weight, reviewed purse lip breathing, will meet with nutritionist soon for one on one session regarding weight lossa   Expected Outcomes Improved strength and stamina, improved shortness of breath and weight loss of 5 pounds in the near future      ITP Comments:   Comments: Tracy Barnes 60 y.o. female Pulmonary Rehab Orientation Note Patient arrived today in Cardiac and Pulmonary Rehab for orientation to Pulmonary Rehab. 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 She does not carry portable oxygen. Per pt, she uses oxygen never. Color good, skin warm and dry. Patient is oriented to time and place. Patient's medical history, psychosocial health, and medications reviewed. Psychosocial assessment reveals pt lives alone. She is a widow x 13 years, her husband had a massive MI.  Pt hobbies include gardening, yardwork and her family.  She does her yardwork independently and would rather be outside.  She has 3 daughters and one is expecting a baby soon.  It is already know the baby will be underweight and she is expecting for the baby to be in [place] until gaining weight and feeding well.   Pt reports her stress level is moderate. Areas of stress/anxiety include Health Family. She worries about her health especially since she has developed more shortness of breath lately.   Pt does not exhibit  signs of depression. PHQ2/9 score 0/0. Pt shows good  coping skills with positive outlook .  Offered emotional support and reassurance. Will continue to monitor and evaluate progress toward psychosocial goal(s) of continued well being. Physical assessment reveals heart rate is normal, breath sounds clear to auscultation, no wheezes, rales, or rhonchi. Grip strength equal, strong. Distal pulses 3+ bilateral posterior tibial pulses present. Patient reports she does take medications as prescribed. Patient states she follows a Regular diet. The patient reports no specific efforts to gain or lose weight.. Patient's weight will be monitored closely. Demonstration and practice of PLB using pulse oximeter. Patient able to return demonstration satisfactorily. Safety and hand hygiene in the exercise area reviewed with patient. Patient voices understanding of the information reviewed. Department expectations discussed with patient and achievable goals were set. The patient shows enthusiasm about attending the program and we look forward to working with this nice lady. The patient is scheduled for a 6 min walk test on Thursday, July 18, 2015 @ 4 pm and to begin exercise on after our medical director signs the ITP and exercise prescription.   PH:[id number]

## 2015-07-18 ENCOUNTER — Ambulatory Visit (HOSPITAL_COMMUNITY): Payer: 59

## 2015-07-22 ENCOUNTER — Encounter: Payer: 59 | Admitting: Nurse Practitioner

## 2015-07-23 ENCOUNTER — Ambulatory Visit (INDEPENDENT_AMBULATORY_CARE_PROVIDER_SITE_OTHER): Payer: 59 | Admitting: Internal Medicine

## 2015-07-23 ENCOUNTER — Encounter: Payer: Self-pay | Admitting: Internal Medicine

## 2015-07-23 ENCOUNTER — Ambulatory Visit (HOSPITAL_COMMUNITY): Payer: 59

## 2015-07-23 VITALS — BP 138/84 | HR 90 | Ht 63.0 in | Wt 199.0 lb

## 2015-07-23 DIAGNOSIS — R911 Solitary pulmonary nodule: Secondary | ICD-10-CM | POA: Diagnosis not present

## 2015-07-23 DIAGNOSIS — R0600 Dyspnea, unspecified: Secondary | ICD-10-CM | POA: Diagnosis not present

## 2015-07-23 DIAGNOSIS — R599 Enlarged lymph nodes, unspecified: Secondary | ICD-10-CM | POA: Diagnosis not present

## 2015-07-23 DIAGNOSIS — R590 Localized enlarged lymph nodes: Secondary | ICD-10-CM

## 2015-07-23 NOTE — Patient Instructions (Addendum)
#  Shortness of breath with crackles  -still unclear and still present - given crackles will get HRCT chest ; May 2017 - glad you are starting  pulmonary rehab at [facility] - April 2017  #Lung nodules - fall 2015  And mediastinal granuloma - [id number] - repeat ct chest with contrast in May 2017  #nodules in hands  - pleas ask Sheela Stack, MD to refer for biopsy - I do not know what it is  #Followup   - May 2017 after complteeing CT chest in may 2017  - no co pay next visit

## 2015-07-23 NOTE — Progress Notes (Signed)
Subjective:     Patient ID: Tracy Barnes, female   DOB: Dec 09, 1955, 60 y.o.   MRN: [id number]  HPI  OV 06/25/2015  Chief Complaint  Patient presents with  . Follow-up    Pt states her breathing has not changed since last OV in 02/2014. Pt states she is aware of a couple spots on her lungs and would like the spots re-evaluated. Pt c/o chest tightness when SOB - resolves with rest. Pt denies cough.    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 She now presents for dyspnea and follow-up of the mediastinal nodes. Last seen in the fall of 2015. Since then she says she has seen cardiology and his been cleared but dyspnea persists. She has not been to pulmonary rehabilitation and seems interested. In terms of her breast cancer she is now on the survivors clinic. In terms of pulmonary nodules she had a follow-up CT chest May 2016 and it was stable. 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 This was also stable on the noncontrast CT chest in May 2016. However she says she wants follow-up CT chest because of persistent dyspnea and  granuloma. Recently in November 2016 and her brother got diagnosed at onset/outset with stage IV lung cancer. He quit smoking 25 years ago. Therefore she is extremely worried given her previous smoking history and medical problems.  Today walking desaturation test 185 feet 3 laps on room air: stayed 100% all 3 laps    OV 07/23/2015  Chief Complaint  Patient presents with  . Follow-up    Pt states her breathing is unchanged since last OV. Pt denies any complaints.    Due to hae fu CT in May 2017 foir persistent dyspnea, crackles on exam and prior stage 1 sarcoid (bx proven sept 2015) but mistakenly asked to come 07/23/2015. She paid her co-pay. She has questions on sarcoid and extension and natural hx. She is having some new non tender small nodules o hand and wrist. PCP asked her to check with me   Review of Systems     Objective:   Physical Exam Brief exam - non focal , oritened x 3. Crackles still +on chest exma   vry small discrte nodules seen on wrist and hands ? Early heberdens node Assessment:       ICD-9-CM ICD-10-CM   1. Dyspnea 786.09 R06.00   2. Lung nodule 793.11 R91.1   3. Mediastinal adenopathy 785.6 R59.9         Plan:     Shortness of breath with crackles  -still unclear and still present - given crackles will get HRCT chest ; May 2017 - glad you are starting  pulmonary rehab at [facility] - April 2017  #Lung nodules - fall 2015  And mediastinal granuloma - [id number] - repeat ct chest with contrast in May 2017  #nodules in hands  - pleas ask Tracy Stack, MD to refer for biopsy - I do not know what it is  #Followup   - May 2017 after complteeing CT chest in may 2017  - no co pay next visit    (> 50% of this 15 min visit spent in face to face counseling or/and coordination of care)

## 2015-07-25 ENCOUNTER — Ambulatory Visit (HOSPITAL_COMMUNITY): Payer: 59

## 2015-07-30 ENCOUNTER — Ambulatory Visit (HOSPITAL_COMMUNITY): Payer: 59

## 2015-08-01 ENCOUNTER — Ambulatory Visit (HOSPITAL_COMMUNITY): Payer: 59

## 2015-08-05 MED FILL — LEVOTHYROXINE 100 MCG TAB: 100 | 30 days supply | Qty: 30 | Fill #1

## 2015-08-05 MED FILL — METFORMIN HCL ER 500 MG TAB: 500 | 30 days supply | Qty: 90 | Fill #5

## 2015-08-05 MED FILL — ALPRAZolam 0.25 MG TABS: 0.25 | 30 days supply | Qty: 60 | Fill #3

## 2015-08-06 ENCOUNTER — Ambulatory Visit (HOSPITAL_COMMUNITY): Payer: 59

## 2015-08-06 ENCOUNTER — Inpatient Hospital Stay (HOSPITAL_COMMUNITY): Admission: RE | Admit: 2015-08-06 | Payer: 59 | Source: Ambulatory Visit

## 2015-08-08 ENCOUNTER — Inpatient Hospital Stay (HOSPITAL_COMMUNITY): Admission: RE | Admit: 2015-08-08 | Payer: 59 | Source: Ambulatory Visit

## 2015-08-13 ENCOUNTER — Inpatient Hospital Stay (HOSPITAL_COMMUNITY): Admission: RE | Admit: 2015-08-13 | Payer: 59 | Source: Ambulatory Visit

## 2015-08-13 ENCOUNTER — Telehealth (HOSPITAL_COMMUNITY): Payer: Self-pay | Admitting: *Deleted

## 2015-08-13 ENCOUNTER — Encounter (HOSPITAL_COMMUNITY): Payer: Self-pay | Admitting: *Deleted

## 2015-08-13 DIAGNOSIS — Z6834 Body mass index (BMI) 34.0-34.9, adult: Secondary | ICD-10-CM | POA: Diagnosis not present

## 2015-08-13 DIAGNOSIS — Z01419 Encounter for gynecological examination (general) (routine) without abnormal findings: Secondary | ICD-10-CM | POA: Diagnosis not present

## 2015-08-13 DIAGNOSIS — Z13820 Encounter for screening for osteoporosis: Secondary | ICD-10-CM | POA: Diagnosis not present

## 2015-08-13 NOTE — Progress Notes (Signed)
Pulmonary Rehab Discharge Note  Tracy Barnes has been discharged from pulmonary rehab due to having a premature grandchild born and her brother has cancer and hospice has been called in to assist in the end of his life.  She has too many commitments to start the program and only came to orientation, she did not complete any exercise sessions.

## 2015-08-13 NOTE — Progress Notes (Signed)
Discharge Summary  Patient Details  Name: MYKYLA FRANKLAND MRN: [id number] Date of Birth: 16-Sep-1955 Referring Provider:     Number of Visits: 0  Reason for Discharge:  Early Exit:  She did not actually begin due to a premature grandchild born and brother with cancer given 2 weeks to live  Smoking History:  History  Smoking status  . Former Smoker -- 0.10 packs/day for .5 years  . Types: Cigarettes  . Quit date: 04/20/1977  Smokeless tobacco  . Never Used    Diagnosis:  No diagnosis found.  ADL UCSD:did not return homework   Initial Exercise Prescription: none, walk test was never done.  She did not show for 4 walk test appointsments   Discharge Exercise Prescription (Final Exercise Prescription Changes):none   Functional Capacity:   Psychological, QOL, Others - Outcomes: PHQ 2/9: Depression screen [facility] 2/9 07/15/2015 03/05/2015 10/08/2014  Decreased Interest 0 0 0  Down, Depressed, Hopeless 0 0 0  PHQ - 2 Score 0 0 0    Quality of Life:   Personal Goals: Goals established at orientation with interventions provided to work toward goal.     Personal Goals and Risk Factors at Admission - 07/15/15 1024    Core Components/Risk Factors/Patient Goals on Admission    Weight Management Obesity;Weight Loss;Yes   Admit Weight 198 lb (89.812 kg)   Goal Weight: Short Term 195 lb (88.451 kg)   Expected Outcomes Short Term: Continue to assess and modify interventions until short term weight is achieved;Long Term: Adherence to nutrition and physical activity/exercise program aimed toward attainment of established weight goal   Sedentary Yes   Intervention Provide advice, education, support and counseling about physical activity/exercise needs.;Develop an individualized exercise prescription for aerobic and resistive training based on initial evaluation findings, risk stratification, comorbidities and participant's personal goals.   Expected Outcomes Achievement of increased  cardiorespiratory fitness and enhanced flexibility, muscular endurance and strength shown through measurements of functional capacity and personal statement of participant.   Increase Strength and Stamina Yes   Intervention Provide advice, education, support and counseling about physical activity/exercise needs.;Develop an individualized exercise prescription for aerobic and resistive training based on initial evaluation findings, risk stratification, comorbidities and participant's personal goals.   Expected Outcomes Achievement of increased cardiorespiratory fitness and enhanced flexibility, muscular endurance and strength shown through measurements of functional capacity and personal statement of participant.   Improve shortness of breath with ADL's Yes   Intervention Provide education, individualized exercise plan and daily activity instruction to help decrease symptoms of SOB with activities of daily living.   Expected Outcomes Short Term: Achieves a reduction of symptoms when performing activities of daily living.   Develop more efficient breathing techniques such as purse lipped breathing and diaphragmatic breathing; and practicing self-pacing with activity Yes   Intervention Provide education, demonstration and support about specific breathing techniuqes utilized for more efficient breathing. Include techniques such as pursed lipped breathing, diaphragmatic breathing and self-pacing activity.   Expected Outcomes Short Term: Participant will be able to demonstrate and use breathing techniques as needed throughout daily activities.       Personal Goals Discharge:     Goals and Risk Factor Review      07/15/15 1036           Core Components/Risk Factors/Patient Goals Review   Personal Goals Review Weight Management/Obesity;Sedentary;Improve shortness of breath with ADL's;Increase Strength and Stamina       Review How to increase strength and stamina, slowly lose weight,  reviewed purse  lip breathing, will meet with nutritionist soon for one on one session regarding weight lossa       Expected Outcomes Improved strength and stamina, improved shortness of breath and weight loss of 5 pounds in the near future          Nutrition & Weight - Outcomes:     Pre Biometrics - 07/15/15 1024    Pre Biometrics   Grip Strength 19 kg       Nutrition:not seen by nutritionist due to not starting program.   Nutrition Discharge:   Education Questionnaire Score:did not return homework   Goals reviewed with patient; copy given to patient.

## 2015-08-15 ENCOUNTER — Ambulatory Visit (HOSPITAL_COMMUNITY): Payer: 59

## 2015-08-20 ENCOUNTER — Ambulatory Visit (INDEPENDENT_AMBULATORY_CARE_PROVIDER_SITE_OTHER)
Admission: RE | Admit: 2015-08-20 | Discharge: 2015-08-20 | Disposition: A | Discharge status: Home / Self Care | Payer: 59 | Source: Ambulatory Visit | Attending: Internal Medicine | Admitting: Internal Medicine

## 2015-08-20 ENCOUNTER — Other Ambulatory Visit (INDEPENDENT_AMBULATORY_CARE_PROVIDER_SITE_OTHER): Payer: 59

## 2015-08-20 ENCOUNTER — Ambulatory Visit (HOSPITAL_COMMUNITY): Payer: 59

## 2015-08-20 DIAGNOSIS — R0600 Dyspnea, unspecified: Secondary | ICD-10-CM

## 2015-08-20 DIAGNOSIS — R599 Enlarged lymph nodes, unspecified: Secondary | ICD-10-CM

## 2015-08-20 DIAGNOSIS — R0989 Other specified symptoms and signs involving the circulatory and respiratory systems: Secondary | ICD-10-CM | POA: Diagnosis not present

## 2015-08-20 DIAGNOSIS — R911 Solitary pulmonary nodule: Secondary | ICD-10-CM | POA: Diagnosis not present

## 2015-08-20 DIAGNOSIS — R590 Localized enlarged lymph nodes: Secondary | ICD-10-CM

## 2015-08-20 LAB — BASIC METABOLIC PANEL
BUN: 15 mg/dL (ref 6–23)
CHLORIDE: 104 meq/L (ref 96–112)
CO2: 26 mEq/L (ref 19–32)
Calcium: 9.4 mg/dL (ref 8.4–10.5)
Creatinine, Ser: 0.78 mg/dL (ref 0.40–1.20)
GFR: 80.76 mL/min (ref >60.00)
Glucose, Bld: 123 mg/dL — ABNORMAL HIGH (ref 70–99)
Potassium: 4.2 mEq/L (ref 3.5–5.1)
SODIUM: 139 meq/L (ref 135–145)

## 2015-08-21 ENCOUNTER — Ambulatory Visit (INDEPENDENT_AMBULATORY_CARE_PROVIDER_SITE_OTHER)
Admission: RE | Admit: 2015-08-21 | Discharge: 2015-08-21 | Disposition: A | Discharge status: Home / Self Care | Payer: 59 | Source: Ambulatory Visit | Attending: Internal Medicine | Admitting: Internal Medicine

## 2015-08-21 DIAGNOSIS — K760 Fatty (change of) liver, not elsewhere classified: Secondary | ICD-10-CM | POA: Diagnosis not present

## 2015-08-21 DIAGNOSIS — R599 Enlarged lymph nodes, unspecified: Secondary | ICD-10-CM | POA: Diagnosis not present

## 2015-08-21 DIAGNOSIS — R590 Localized enlarged lymph nodes: Secondary | ICD-10-CM | POA: Diagnosis not present

## 2015-08-21 DIAGNOSIS — I517 Cardiomegaly: Secondary | ICD-10-CM | POA: Diagnosis not present

## 2015-08-21 DIAGNOSIS — R911 Solitary pulmonary nodule: Secondary | ICD-10-CM | POA: Diagnosis not present

## 2015-08-21 MED ORDER — IOPAMIDOL (ISOVUE-300) INJECTION 61%
80.0000 mL | Freq: Once | INTRAVENOUS | Status: AC | PRN
  Administered 2015-08-21: 80 mL via INTRAVENOUS

## 2015-08-21 NOTE — Progress Notes (Signed)
Quick Note:  Called and spoke to pt. Informed her of the results per MR. Pt verbalized understanding and denied any further questions or concerns at this time.   ______ 

## 2015-08-22 ENCOUNTER — Ambulatory Visit (HOSPITAL_COMMUNITY): Payer: 59

## 2015-08-26 ENCOUNTER — Encounter (HOSPITAL_COMMUNITY): Payer: Self-pay | Admitting: *Deleted

## 2015-08-26 ENCOUNTER — Other Ambulatory Visit: Payer: Self-pay

## 2015-08-26 VITALS — BP 128/82 | HR 80 | Resp 16 | Ht 63.0 in | Wt 195.8 lb

## 2015-08-26 DIAGNOSIS — R7303 Prediabetes: Secondary | ICD-10-CM

## 2015-08-26 NOTE — Patient Instructions (Signed)
1. Plan to walk 15-20 minutes 5 times a week at work.  Plan to start pulmonary rehab 2. Plan to check blood sugar once a week with goals of less than 100 fasting and less than 140 2 hours after eating 3. Plan to limit carbohydrates to 2-3 (30-45) gm servings a meal and 15 gms at snacks.  Try to have protein with snacks.  Try to avoid sugar drinks 4. Plan to see Dr. Forde Dandy in July 5. Plan to return to [facility] on 11/25/15 at 3:30PM

## 2015-08-26 NOTE — Patient Outreach (Signed)
[facility] [facility]) Care Management   08/26/2015  Tracy Barnes 06-Apr-1956 [id number]  Tracy Barnes is an 60 y.o. female.   Member seen for follow up office visit for Link to Wellness program for self management of Prediabetes  Subjective: Member states that she was to start Pulmonary Rehab but it had to be put on hold after her daugther had premature baby and her brother died.  States she is to see Dr. Chase Caller tomorrow for a follow up.  States she is no longer drinking ginger ale and she is trying to eat more protein with her meals.  States her blood sugars range 105-120 fasting  when she checks in the morning.     Objective:   Review of Systems  Respiratory: Positive for shortness of breath.   All other systems reviewed and are negative.   Physical Exam Today's Vitals   08/26/15 1534  BP: 128/82  Pulse: 80  Resp: 16  Height: 1.6 m (5\' 3" )  Weight: 195 lb 12.8 oz (88.814 kg)  SpO2: 97%  PainSc: 0-No pain   Encounter Medications:   Outpatient Encounter Prescriptions as of 08/26/2015  Medication Sig  . ALPRAZolam (XANAX) 0.25 MG tablet Take 0.25 mg by mouth at bedtime as needed.    . ATORVASTATIN CALCIUM PO Take 20 mg by mouth daily.   . Calcium-Magnesium-Vitamin D (CITRACAL SLOW RELEASE PO) Take 1 tablet by mouth daily.  Marland Kitchen desvenlafaxine (PRISTIQ) 100 MG 24 hr tablet Take 100 mg by mouth daily.  . fluticasone (FLONASE) 50 MCG/ACT nasal spray Place 2 sprays into both nostrils daily as needed.  . hydrochlorothiazide (HYDRODIURIL) 12.5 MG tablet Take 12.5 mg by mouth daily.  Marland Kitchen levothyroxine (SYNTHROID, LEVOTHROID) 100 MCG tablet Take 100 mcg by mouth daily before breakfast.  . metFORMIN (GLUCOPHAGE) 500 MG tablet Take 500 mg by mouth 2 (two) times daily with a meal.   . pantoprazole (PROTONIX) 40 MG tablet TAKE 1 TABLET BY MOUTH ONCE DAILY  . zolpidem (AMBIEN) 5 MG tablet TAKE 1 TABLET BY MOUTH AT BEDTIME AS NEEDED FOR SLEEP (Patient not taking: Reported on  08/26/2015)   No facility-administered encounter medications on file as of 08/26/2015.    Functional Status:   In your present state of health, do you have any difficulty performing the following activities: 08/26/2015 03/05/2015  Hearing? N N  Vision? N N  Difficulty concentrating or making decisions? N N  Walking or climbing stairs? N N  Dressing or bathing? N N  Doing errands, shopping? N N  Preparing Food and eating ? - -  Using the Toilet? - -  In the past six months, have you accidently leaked urine? - -  Do you have problems with loss of bowel control? - -  Managing your Medications? - -  Managing your Finances? - -  Housekeeping or managing your Housekeeping? - -    Fall/Depression Screening:    PHQ 2/9 Scores 08/26/2015 07/15/2015 03/05/2015 10/08/2014  PHQ - 2 Score 0 0 0 0    Assessment:  Member seen for follow up office visit for Link to Wellness program for self management of Pre-diabetes. Member is maintaining hemoglobin A1C goal of 6.4 or less with reading 6.4% Reports adhering to lower CHO diet and she is no longer drinking a regular ginger ale. She reports walking 5 times a week for 15-20 minutes at lunch. Member to start Pulmonary Rehab.  Plan:  Plan to walk 15-20 minutes 5 times a week at work.  Plan to start pulmonary rehab Plan to check blood sugar once a week with goals of less than 100 fasting and less than 140 2 hours after eating Plan to limit carbohydrates to 2-3 (30-45) gm servings a meal and 15 gms at snacks.  Try to have protein with snacks.  Try to avoid sugar drinks Plan to see Dr. Forde Dandy in July Plan to return to Link to Wellness on 11/25/15 at 3:30PM   [facility] CM Care Plan Problem One        Most Recent Value   Care Plan Problem One  Potentilal for elevated blood sugars related to dx of prediabtes   Role Documenting the Problem One  Care Management Faith for Problem One  Active   [facility] Long Term Goal (31-90 days)  Member will maintain  hemoglobin A1C below 6.5 for the next 90 days   [facility] Long Term Goal Start Date  08/26/15 [Continue at goal hemoglobin A1C 6.4%]   Interventions for Problem One Long Term Goal  Reviewed CHO counting and portion control,Reinforced to try to lose weight, Encouraged to start Pulmonary Rehab, Praised for not drinking Ginger Ale,  Reinforced to try to avoid drinks with sugar and soda, Reviewed numbers and goals for hemoglobin A1C and blood sugars, Reinforced to try to eat protein with snacks , Reinforced importance  of regular exercise for  glycemic control, Instructed to keep appointment with Dr.South in July    Peter Garter RN, [facility] Care Management Coordinator-Link to [place] Management [phone]

## 2015-08-27 ENCOUNTER — Ambulatory Visit (HOSPITAL_COMMUNITY): Payer: 59

## 2015-08-28 ENCOUNTER — Ambulatory Visit: Payer: 59 | Admitting: Internal Medicine

## 2015-08-29 ENCOUNTER — Ambulatory Visit (HOSPITAL_COMMUNITY): Payer: 59

## 2015-08-29 ENCOUNTER — Ambulatory Visit (INDEPENDENT_AMBULATORY_CARE_PROVIDER_SITE_OTHER): Payer: 59 | Admitting: Internal Medicine

## 2015-08-29 ENCOUNTER — Encounter: Payer: Self-pay | Admitting: Internal Medicine

## 2015-08-29 VITALS — BP 142/92 | HR 84 | Ht 63.0 in | Wt 198.0 lb

## 2015-08-29 DIAGNOSIS — R0600 Dyspnea, unspecified: Secondary | ICD-10-CM

## 2015-08-29 DIAGNOSIS — D860 Sarcoidosis of lung: Secondary | ICD-10-CM

## 2015-08-29 DIAGNOSIS — R0609 Other forms of dyspnea: Secondary | ICD-10-CM

## 2015-08-29 NOTE — Patient Instructions (Signed)
Dyspnea on exertion  Pulmonary sarcoidosis (HCC)   - Shortness of breath: I agree with you starting pulmonary rehabilitation. There appears to be no change in her sarcoidosis. Return in 4 months with full pulmonary function test to reevaluate. At that time we can consider, depending on response, renal pulmonary stress test versus empiric steroids  - Pulmonary sarcoidosis: Stable mediastinal adenopathy since 2011. Possible mild increase in pulmonary infiltrates versus stability on CT chest May 2017. We will just follow this clinically.  Follow-up - Full pulmonary function tests in 4 months - Return to see me in 4 months or sooner if needed.

## 2015-08-29 NOTE — Progress Notes (Signed)
Subjective:     Patient ID: Tracy Barnes, female   DOB: [birth year], 60 y.o.   MRN: [id number]  HPI    OV 06/25/2015  Chief Complaint  Patient presents with  . Follow-up    Pt states her breathing has not changed since last OV in 02/2014. Pt states she is aware of a couple spots on her lungs and would like the spots re-evaluated. Pt c/o chest tightness when SOB - resolves with rest. Pt denies cough.    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 She now presents for dyspnea and follow-up of the mediastinal nodes. Last seen in the fall of 2015. Since then she says she has seen cardiology and his been cleared but dyspnea persists. She has not been to pulmonary rehabilitation and seems interested. In terms of her breast cancer she is now on the survivors clinic. In terms of pulmonary nodules she had a follow-up CT chest May 2016 and it was stable. 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 This was also stable on the noncontrast CT chest in May 2016. However she says she wants follow-up CT chest because of persistent dyspnea and  granuloma. Recently in November 2016 and her brother got diagnosed at onset/outset with stage IV lung cancer. He quit smoking 25 years ago. Therefore she is extremely worried given her previous smoking history and medical problems.  Today walking desaturation test 185 feet 3 laps on room air: stayed 100% all 3 laps    OV 07/23/2015  Chief Complaint  Patient presents with  . Follow-up    Pt states her breathing is unchanged since last OV. Pt denies any complaints.    Due to hae fu CT in May 2017 foir persistent dyspnea, crackles on exam and prior stage 1 sarcoid (bx proven sept 2015) but mistakenly asked to come 07/23/2015. She paid her co-pay. She has questions on sarcoid and extension and natural hx. She is having some new non tender small nodules o hand and wrist. PCP asked her to check with me   OV 08/29/2015  Chief Complaint  Patient  presents with  . Follow-up    Pt here after CT chest. Pt states her breathing is unchanged. Pt denies any new complaints.    This no charge visit because she came for follow-up in April 2017 prematurely due to mistaken scheduling.  Follow-up dyspnea : Normal cardiac stress test in 2015 and and also pulmonary stress test showing chronotropic incompetence in November 2015. This in the setting of obesity. In early stage sarcoidosis of the lung system. She continues to be delayed by dyspnea. It is present on exertion relieved by rest. She is going to start pulmonary rehabilitation. She prefers to go to pulmonary rehabilitation and at the end if there is still dyspnea then to repeat pulmonary stress testing.  Follow-up sarcoidosis: She was really worried that she has extensive cancer. Therefore we did CT scan of the chest which is documented below. Mediastinal adenopathy stable. There is possible progression and pulmonary interstitial infiltrates of sarcoidosis versus stability. This could account for worsening dyspnea would be not sure at this point. She prefers to go to pulmonary rehabilitation.  IMPRESSION: CT chest high resolution 1. Patchy perilymphatic distribution nodularity throughout both lungs and fine peribronchovascular interstitial beading, mildly increased since 09/03/2014, most consistent with slight progression of pulmonary sarcoidosis. 2. Stable moderate mediastinal and bilateral hilar lymphadenopathy, consistent with sarcoidosis. 3. Mild air trapping, indicating small airways disease. 4. Two  vessel coronary atherosclerosis. 5. Diffuse hepatic steatosis.   Electronically Signed  By: Ilona Sorrel M.D.  On: 08/20/2015 09:38          IMPRESSION: CT chest with contrast 1. Persistent mediastinal, hilar, and infrahilar adenopathy, previously hypermetabolic on the [id number], previous biopsy from 12/27/2013 demonstrated non caseating granulomatous  inflammation consistent with sarcoidosis. From a purely imaging standpoint, lymphoma could cause a similar appearance, although biopsy results were not supportive of lymphoma. 2. Stable bilateral pulmonary nodules, the largest measuring 1.0 by 0.8 cm. Looking back in 2011 this nodule is of a similar size or very minimally smaller on that exam, and accordingly seems unlikely to represent active malignancy. Given the nodal findings, these nodules are most likely a manifestation of sarcoidosis although surveillance imaging may be warranted given the history of malignancy. 3. Hepatic steatosis. 4. Mild cardiomegaly.   Electronically Signed  By: Van Clines M.D.  On: 08/21/2015 09:14   has a past medical history of Cancer ([place]) (02/19/2010); radiation therapy (06/18/10 to 08/01/10); Diabetes mellitus; Hypertension; Hyperlipidemia; Dyspnea on exertion; Chest pain; GERD (gastroesophageal reflux disease); and Thyroid disease.   reports that she quit smoking about 38 years ago. Her smoking use included Cigarettes. She has a .05 pack-year smoking history. She has never used smokeless tobacco.  Past Surgical History  Procedure Laterality Date  . Abdominal hysterectomy      At 24 yrs of age. One ovary removed. Surgery due to fibroids per medical history form dated 02/25/10.  . Breast surgery  03/25/2010    left lumpectomy  . Deep axillary sentinel node biopsy / excision  03/25/2010    4/4 nodes negative  . Portacath placement  04/18/2010    Dr. Fanny Skates  . Port-a-cath removal  06/05/2011    Procedure: MINOR REMOVAL PORT-A-CATH;  Surgeon: Adin Hector, MD;  Location: [facility];  Service: General;  Laterality: N/A;  right  . Video bronchoscopy with endobronchial ultrasound N/A 12/27/2013    Procedure: VIDEO BRONCHOSCOPY WITH ENDOBRONCHIAL ULTRASOUND;  Surgeon: Melrose Nakayama, MD;  Location: [facility];  Service: Thoracic;  Laterality: N/A;  . Mediastinoscopy N/A  12/27/2013    Procedure: MEDIASTINOSCOPY;  Surgeon: Melrose Nakayama, MD;  Location: [facility];  Service: Thoracic;  Laterality: N/A;    Allergies  Allergen Reactions  . Codeine Itching    Updated per [place].    Immunization History  Administered Date(s) Administered  . Influenza Split 01/18/2013, 01/08/2014  . Influenza,inj,Quad PF,36+ Mos 01/19/2015    Family History  Problem Relation Age of Onset  . Cancer Mother     Cancer of bladder and brain per medical history form dated 02/25/10.  Marland Kitchen Heart disease Father   . COPD Father   . Aortic stenosis Father      Current outpatient prescriptions:  .  ALPRAZolam (XANAX) 0.25 MG tablet, Take 0.25 mg by mouth at bedtime as needed.  , Disp: , Rfl:  .  ATORVASTATIN CALCIUM PO, Take 20 mg by mouth daily. , Disp: , Rfl:  .  Calcium-Magnesium-Vitamin D (CITRACAL SLOW RELEASE PO), Take 1 tablet by mouth daily., Disp: , Rfl:  .  desvenlafaxine (PRISTIQ) 100 MG 24 hr tablet, Take 100 mg by mouth daily., Disp: , Rfl:  .  fluticasone (FLONASE) 50 MCG/ACT nasal spray, Place 2 sprays into both nostrils daily as needed., Disp: , Rfl:  .  hydrochlorothiazide (HYDRODIURIL) 12.5 MG tablet, Take 12.5 mg by mouth daily., Disp: , Rfl:  .  levothyroxine (SYNTHROID,  LEVOTHROID) 100 MCG tablet, Take 100 mcg by mouth daily before breakfast., Disp: , Rfl:  .  metFORMIN (GLUCOPHAGE) 500 MG tablet, Take 500 mg by mouth 2 (two) times daily with a meal. , Disp: , Rfl:  .  pantoprazole (PROTONIX) 40 MG tablet, TAKE 1 TABLET BY MOUTH ONCE DAILY, Disp: 90 tablet, Rfl: 1     Review of Systems     Objective:   Physical Exam  Filed Vitals:   08/29/15 0914  BP: 142/92  Pulse: 84  Height: 5\' 3"  (1.6 m)  Weight: 198 lb (89.812 kg)  SpO2: 96%  Body mass index is 35.08 kg/(m^2).   Focused exam: Obese. Pleasant. Alert and oriented 3. Sitting normally. Walking normally. Clear to auscultation bilaterally this time. Normal heart sounds.     Assessment:        ICD-9-CM ICD-10-CM   1. Dyspnea on exertion 786.09 R06.09   2. Pulmonary sarcoidosis ([place]) 135 D86.0    517.8          Plan:       - Shortness of breath: I agree with you starting pulmonary rehabilitation. There appears to be no change in her sarcoidosis. Return in 4 months with full pulmonary function test to reevaluate. At that time we can consider, depending on response, renal pulmonary stress test versus empiric steroids  - Pulmonary sarcoidosis: Stable mediastinal adenopathy since 2011. Possible mild increase in pulmonary infiltrates versus stability on CT chest May 2017. We will just follow this clinically.  Follow-up - Full pulmonary function tests in 4 months - Return to see me in 4 months or sooner if needed.  Dr. Brand Males, M.D., [facility].C.P Pulmonary and Critical Care Medicine Staff Physician [place] [facility] Pager: [phone], If no answer or between  15:00h - 7:00h: call [phone]  08/29/2015 9:37 AM

## 2015-09-03 ENCOUNTER — Ambulatory Visit (HOSPITAL_COMMUNITY): Payer: 59

## 2015-09-03 MED FILL — METFORMIN HCL ER 500 MG TAB: 500 | 90 days supply | Qty: 270 | Fill #0

## 2015-09-03 MED FILL — DESVENLAFAXINE SUC ER 100 M: 100 | 90 days supply | Qty: 90 | Fill #0

## 2015-09-03 MED FILL — ALPRAZolam 0.25 MG TABS: 0.25 | 30 days supply | Qty: 60 | Fill #0

## 2015-09-03 MED FILL — HYDROCHLOROTHIAZIDE 12.5 MG: 12.5 | 90 days supply | Qty: 90 | Fill #2

## 2015-09-03 MED FILL — LEVOTHYROXINE 100 MCG TAB: 100 | 30 days supply | Qty: 30 | Fill #2

## 2015-09-03 MED FILL — PANTOPRAZOLE SOD DR 40 MG T: 40 | 90 days supply | Qty: 180 | Fill #0

## 2015-09-05 ENCOUNTER — Ambulatory Visit (HOSPITAL_COMMUNITY): Payer: 59

## 2015-09-10 ENCOUNTER — Ambulatory Visit (HOSPITAL_COMMUNITY): Payer: 59

## 2015-09-10 MED FILL — TRUE METRIX GLUCOSE TEST ST: 33 days supply | Qty: 100 | Fill #0

## 2015-09-10 MED FILL — TRUEplus LANCETS 30G MISC: 33 days supply | Qty: 100 | Fill #0

## 2015-09-12 ENCOUNTER — Ambulatory Visit (HOSPITAL_COMMUNITY): Payer: 59

## 2015-09-17 ENCOUNTER — Ambulatory Visit (HOSPITAL_COMMUNITY): Payer: 59

## 2015-09-18 MED FILL — ZOLPIDEM TARTRATE 5 MG TAB: 5 | 90 days supply | Qty: 90 | Fill #0

## 2015-09-19 ENCOUNTER — Ambulatory Visit (HOSPITAL_COMMUNITY): Payer: 59

## 2015-09-24 ENCOUNTER — Ambulatory Visit (HOSPITAL_COMMUNITY): Payer: 59

## 2015-09-24 MED FILL — ATORVASTATIN 20 MG TABLET: 20 | 90 days supply | Qty: 90 | Fill #1

## 2015-09-26 ENCOUNTER — Ambulatory Visit (HOSPITAL_COMMUNITY): Payer: 59

## 2015-10-01 ENCOUNTER — Ambulatory Visit (HOSPITAL_COMMUNITY): Payer: 59

## 2015-10-03 ENCOUNTER — Ambulatory Visit (HOSPITAL_COMMUNITY): Payer: 59

## 2015-10-04 DIAGNOSIS — L308 Other specified dermatitis: Secondary | ICD-10-CM | POA: Diagnosis not present

## 2015-10-04 DIAGNOSIS — L918 Other hypertrophic disorders of the skin: Secondary | ICD-10-CM | POA: Diagnosis not present

## 2015-10-04 DIAGNOSIS — L820 Inflamed seborrheic keratosis: Secondary | ICD-10-CM | POA: Diagnosis not present

## 2015-10-04 DIAGNOSIS — L821 Other seborrheic keratosis: Secondary | ICD-10-CM | POA: Diagnosis not present

## 2015-10-04 DIAGNOSIS — D863 Sarcoidosis of skin: Secondary | ICD-10-CM | POA: Diagnosis not present

## 2015-10-08 ENCOUNTER — Ambulatory Visit (HOSPITAL_COMMUNITY): Payer: 59

## 2015-10-10 ENCOUNTER — Ambulatory Visit (HOSPITAL_COMMUNITY): Payer: 59

## 2015-10-15 ENCOUNTER — Ambulatory Visit (HOSPITAL_COMMUNITY): Payer: 59

## 2015-10-17 ENCOUNTER — Ambulatory Visit (HOSPITAL_COMMUNITY): Payer: 59

## 2015-10-22 ENCOUNTER — Ambulatory Visit (HOSPITAL_COMMUNITY): Payer: 59

## 2015-10-24 ENCOUNTER — Ambulatory Visit (HOSPITAL_COMMUNITY): Payer: 59

## 2015-10-28 ENCOUNTER — Telehealth: Payer: Self-pay | Admitting: Internal Medicine

## 2015-10-28 DIAGNOSIS — D860 Sarcoidosis of lung: Secondary | ICD-10-CM

## 2015-10-28 NOTE — Telephone Encounter (Signed)
I have replaced referral.  LMTCB for portia to make aware

## 2015-10-29 ENCOUNTER — Ambulatory Visit (HOSPITAL_COMMUNITY): Payer: 59

## 2015-10-29 NOTE — Telephone Encounter (Signed)
Spoke with Thayer Headings at [employer], aware of order being placed.  Nothing further needed.

## 2015-10-30 ENCOUNTER — Telehealth: Payer: Self-pay | Admitting: Internal Medicine

## 2015-10-30 NOTE — Telephone Encounter (Signed)
Skin bx of left superior medial chest by  DR Rolm Bookbinder on 6/16/`17 - non caseating granuloma c/w sarcoid

## 2015-11-11 ENCOUNTER — Encounter (HOSPITAL_COMMUNITY)
Admission: RE | Admit: 2015-11-11 | Discharge: 2015-11-11 | Disposition: A | Discharge status: Home / Self Care | Payer: 59 | Source: Ambulatory Visit | Attending: Internal Medicine | Admitting: Internal Medicine

## 2015-11-11 ENCOUNTER — Encounter (HOSPITAL_COMMUNITY): Payer: Self-pay

## 2015-11-11 VITALS — BP 143/78 | HR 88 | Ht 63.0 in | Wt 199.3 lb

## 2015-11-11 DIAGNOSIS — D860 Sarcoidosis of lung: Secondary | ICD-10-CM | POA: Insufficient documentation

## 2015-11-11 MED FILL — LEVOTHYROXINE 100 MCG TAB: 100 | 30 days supply | Qty: 30 | Fill #3

## 2015-11-11 MED FILL — ALPRAZolam 0.25 MG TABS: 0.25 | 30 days supply | Qty: 60 | Fill #1

## 2015-11-11 NOTE — Progress Notes (Signed)
Tracy Barnes 60 y.o. female Pulmonary Rehab Orientation Note Patient arrived today in Cardiac and Pulmonary Rehab for orientation to Pulmonary Rehab. She is a current employee of [employer] and ambulated from her department upstairs. She denied difficulty. She has not been prescribed oxygen for home use.Color good, skin warm and dry. Patient is oriented to time and place. Patient's medical history, psychosocial health, and medications reviewed. Psychosocial assessment reveals pt lives alone. She was widowed several years ago. Pt is currently employed full time at [employer]. Pt hobbies include spending time with her grandchildren. Pt reports her stress level is very low. Pt does not exhibit signs of depression. PHQ2/9 score 0/na. Pt shows good  coping skills with positive outlook . She is offered emotional support and reassurance. Will continue to monitor and evaluate progress toward psychosocial goal(s) of remaining positive about her future and disease process. She has also had skin lesions biopsied and they are sarcoid in origin. She is waiting for her appointment with her dermatologist to discuss options for treatment. Physical assessment reveals heart rate is normal, breath sounds clear to auscultation, no wheezes, rales, or rhonchi. Course sounds in the left lower lung. Grip strength equal, strong. Distal pulses palpable. Patient reports she does take medications as prescribed. Patient states she follows a Diabetic diet. The patient reports no specific efforts to gain or lose weight.. Patient's weight will be monitored closely. Demonstration and practice of PLB using pulse oximeter. Patient able to return demonstration satisfactorily. Safety and hand hygiene in the exercise area reviewed with patient. Patient voices understanding of the information reviewed. Department expectations discussed with patient and achievable goals were set. The patient shows enthusiasm about attending the program and  we look forward to working with this nice lady. The patient is scheduled for a 6 min walk test on Tuesday 8/1 at 3:30 and to begin exercise on Tuesday 8/8 in the 1030 class.   45 minutes was spent on a variety of activities such as assessment of the patient, obtaining baseline data including height, weight, BMI, and grip strength, verifying medical history, allergies, and current medications, and teaching patient strategies for performing tasks with less respiratory effort with emphasis on pursed lip breathing.

## 2015-11-12 ENCOUNTER — Encounter: Payer: Self-pay | Admitting: Internal Medicine

## 2015-11-19 ENCOUNTER — Encounter (HOSPITAL_COMMUNITY)
Admission: RE | Admit: 2015-11-19 | Discharge: 2015-11-19 | Disposition: A | Discharge status: Home / Self Care | Payer: 59 | Source: Ambulatory Visit | Attending: Internal Medicine | Admitting: Internal Medicine

## 2015-11-19 DIAGNOSIS — D860 Sarcoidosis of lung: Secondary | ICD-10-CM | POA: Diagnosis not present

## 2015-11-19 DIAGNOSIS — R0600 Dyspnea, unspecified: Secondary | ICD-10-CM

## 2015-11-19 NOTE — Progress Notes (Signed)
Pulmonary Individual Treatment Plan  Patient Details  Name: Tracy Barnes MRN: [id number] Date of Birth: Oct 27, 1955 Referring Provider:   April Manson Pulmonary Rehab Walk Test from 11/19/2015 in [place]  Referring Provider  Dr. Chase Caller      Initial Encounter Date:  Flowsheet Row Pulmonary Rehab Walk Test from 11/19/2015 in [place]  Date  11/19/15  Referring Provider  Dr. 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  ALPRAZolam (XANAX) 0.25 MG tablet, Take 0.25 mg by mouth at bedtime as needed.  , Disp: , Rfl:  .  ATORVASTATIN CALCIUM PO, Take 20 mg by mouth daily. , Disp: , Rfl:  .  Calcium-Magnesium-Vitamin D (CITRACAL SLOW RELEASE PO), Take 1 tablet by mouth daily., Disp: , Rfl:  .  desvenlafaxine (PRISTIQ) 100 MG 24 hr tablet, Take 100 mg by mouth daily., Disp: , Rfl:  .  fluticasone (FLONASE) 50 MCG/ACT nasal spray, Place 2 sprays into both nostrils daily as needed., Disp: , Rfl:  .  hydrochlorothiazide (HYDRODIURIL) 12.5 MG tablet, Take 12.5 mg by mouth daily., Disp: , Rfl:  .  levothyroxine (SYNTHROID, LEVOTHROID) 100 MCG tablet, Take 100 mcg by mouth daily before breakfast., Disp: , Rfl:  .  metFORMIN (GLUCOPHAGE) 500 MG tablet, Take 500 mg by mouth 2 (two) times daily with a meal. , Disp: , Rfl:  .  pantoprazole (PROTONIX) 40 MG tablet, TAKE 1 TABLET BY MOUTH ONCE DAILY, Disp: 90 tablet, Rfl: 1 .  TRUE METRIX BLOOD GLUCOSE TEST test strip, , Disp: , Rfl: 6 .  TRUEPLUS LANCETS 30G MISC, , Disp: , Rfl: 6  Past Medical History: Past Medical History:  Diagnosis Date  . Cancer ([place]) 02/19/2010   left breast  . Chest pain   . Diabetes mellitus   . Dyspnea on exertion   . GERD (gastroesophageal reflux disease)   . Hx of radiation therapy 06/18/10 to 08/01/10   L breast  . Hyperlipidemia   .  Hypertension   . Thyroid disease     Tobacco Use: History  Smoking Status  . Former Smoker  . Packs/day: 0.10  . Years: 0.50  . Types: Cigarettes  . Quit date: 04/20/1977  Smokeless Tobacco  . Never Used    Labs: Recent Review Flowsheet Data    Labs for ITP Cardiac and Pulmonary Rehab 03/05/2015   Hemoglobin A1c 6.3      Capillary Blood Glucose: Lab Results  Component Value Date   GLUCAP 154 (H) 12/27/2013   GLUCAP 114 (H) 12/27/2013   GLUCAP 100 (H) 12/27/2013   GLUCAP 108 (H) 12/15/2013     ADL [facility]:   Pulmonary Function Assessment:     Pulmonary Function Assessment - 11/11/15 0958      Breath   Bilateral Breath Sounds Other   Other course in the bases   Shortness of Breath Yes;Limiting activity      Exercise Target Goals: Date: 11/19/15  Exercise Program Goal: Individual exercise prescription set with THRR, safety & activity barriers. Participant demonstrates ability to understand and report RPE using BORG scale, to self-measure pulse accurately, and to acknowledge the importance of the exercise prescription.  Exercise Prescription Goal: Starting with aerobic activity 30 plus minutes a day, 3 days per week for initial exercise prescription. Provide home exercise prescription and guidelines that participant acknowledges understanding prior to discharge.  Activity Barriers & Risk Stratification:     Activity Barriers & Cardiac Risk Stratification - 11/11/15 0957      Activity Barriers & Cardiac Risk Stratification   Activity Barriers Deconditioning;Shortness of Breath      6 Minute Walk:     6 Minute Walk    Row Name 11/19/15 1529         6 Minute Walk   Phase Initial     Distance 1820 feet     Walk Time 6 minutes     # of Rest Breaks 0     MPH 3.44     METS 3.6     RPE 13     Perceived Dyspnea  2     Symptoms No     Resting HR 81 bpm     Resting BP 122/70     Max Ex. HR 112 bpm     Max Ex. BP 160/80     2 Minute Post BP 150/84        Interval HR   Baseline HR 81     1 Minute HR 104     2 Minute HR 112     3 Minute HR 111     4 Minute HR 109     5 Minute HR 107     6 Minute HR 107     2 Minute Post HR 89     Interval Heart Rate? Yes       Interval Oxygen   Interval Oxygen? Yes     Baseline Oxygen Saturation % 95 %     Baseline Liters of Oxygen 0 L     1 Minute Oxygen Saturation % 96 %     1 Minute Liters of Oxygen 0 L     2 Minute Oxygen Saturation % 96 %     2 Minute Liters of Oxygen 0 L     3 Minute Oxygen Saturation % 95 %     3 Minute Liters of Oxygen 0 L     4 Minute Oxygen Saturation % 95 %     4 Minute Liters of Oxygen 0 L     5 Minute Oxygen Saturation % 95 %     5 Minute Liters of Oxygen 0 L     6 Minute Oxygen Saturation % 96 %     6 Minute Liters of Oxygen 0 L     2 Minute Post Oxygen Saturation % 97 %     2 Minute Post Liters of Oxygen 0 L        Initial Exercise Prescription:     Initial Exercise Prescription - 11/19/15 1500      Date of Initial Exercise RX and Referring Provider   Date 11/19/15   Referring Provider Dr. Chase Caller     Treadmill   MPH 1.7   Grade 0   Minutes 17     Bike   Level 0.5   Minutes 17     NuStep   Level 2   Minutes 17   METs 1.7      Perform Capillary Blood Glucose checks as needed.  Exercise Prescription Changes:   Exercise Comments:   Discharge Exercise Prescription (Final Exercise Prescription Changes):    Nutrition:  Target Goals: Understanding of nutrition guidelines, daily intake of sodium 1500mg , cholesterol 200mg , calories 30% from fat and 7% or less from saturated fats, daily to have 5 or more servings of fruits and vegetables.  Biometrics:  Pre Biometrics - 11/11/15 0959      Pre Biometrics   Grip Strength 18 kg       Nutrition Therapy Plan and Nutrition Goals:   Nutrition Discharge: Rate Your Plate Scores:   Psychosocial: Target Goals: Acknowledge presence or absence of depression, maximize coping  skills, provide positive support system. Participant is able to verbalize types and ability to use techniques and skills needed for reducing stress and depression.  Initial Review & Psychosocial Screening:     Initial Psych Review & Screening - 11/11/15 1004      Family Dynamics   Good Support System? Yes     Barriers   Psychosocial barriers to participate in program There are no identifiable barriers or psychosocial needs.     Screening Interventions   Interventions Encouraged to exercise      Quality of Life Scores:   PHQ-9: Recent Review Flowsheet Data    Depression screen [facility] 2/9 11/11/2015 08/26/2015 07/15/2015 03/05/2015 10/08/2014   Decreased Interest 0 0 0 0 0   Down, Depressed, Hopeless 0 0 0 0 0   PHQ - 2 Score 0 0 0 0 0      Psychosocial Evaluation and Intervention:   Psychosocial Re-Evaluation:  Education: Education Goals: Education classes will be provided on a weekly basis, covering required topics. Participant will state understanding/return demonstration of topics presented.  Learning Barriers/Preferences:     Learning Barriers/Preferences - 11/11/15 [id number]      Learning Barriers/Preferences   Learning Barriers None   Learning Preferences Group Instruction;Individual Instruction;Skilled Demonstration;Verbal Instruction;Written Material;Computer/Internet      Education Topics: Risk Factor Reduction:  -Group instruction that is supported by a PowerPoint presentation. Instructor discusses the definition of a risk factor, different risk factors for pulmonary disease, and how the heart and lungs work together.     Nutrition for Pulmonary Patient:  -Group instruction provided by PowerPoint slides, verbal discussion, and written materials to support subject matter. The instructor gives an explanation and review of healthy diet recommendations, which includes a discussion on weight management, recommendations for fruit and vegetable consumption, as well as  protein, fluid, caffeine, fiber, sodium, sugar, and alcohol. Tips for eating when patients are short of breath are discussed.   Pursed Lip Breathing:  -Group instruction that is supported by demonstration and informational handouts. Instructor discusses the benefits of pursed lip and diaphragmatic breathing and detailed demonstration on how to preform both.     Oxygen Safety:  -Group instruction provided by PowerPoint, verbal discussion, and written material to support subject matter. There is an overview of "What is Oxygen" and "Why do we need it".  Instructor also reviews how to create a safe environment for oxygen use, the importance of using oxygen as prescribed, and the risks of noncompliance. There is a brief discussion on traveling with oxygen and resources the patient may utilize.   Oxygen Equipment:  -Group instruction provided by [facility] Staff utilizing handouts, written materials, and equipment demonstrations.   Signs and Symptoms:  -Group instruction provided by written material and verbal discussion to support subject matter. Warning signs and symptoms of infection, stroke, and heart attack are reviewed and when to call the physician/911 reinforced. Tips for preventing the spread of infection discussed.   Advanced Directives:  -Group instruction provided by verbal instruction and written material to support subject matter. Instructor reviews Advanced Directive laws and proper instruction for filling out document.   Pulmonary Video:  -Group video education that reviews the importance of medication  and oxygen compliance, exercise, good nutrition, pulmonary hygiene, and pursed lip and diaphragmatic breathing for the pulmonary patient.   Exercise for the Pulmonary Patient:  -Group instruction that is supported by a PowerPoint presentation. Instructor discusses benefits of exercise, core components of exercise, frequency, duration, and intensity of an exercise routine,  importance of utilizing pulse oximetry during exercise, safety while exercising, and options of places to exercise outside of rehab.     Pulmonary Medications:  -Verbally interactive group education provided by instructor with focus on inhaled medications and proper administration.   Anatomy and Physiology of the Respiratory System and Intimacy:  -Group instruction provided by PowerPoint, verbal discussion, and written material to support subject matter. Instructor reviews respiratory cycle and anatomical components of the respiratory system and their functions. Instructor also reviews differences in obstructive and restrictive respiratory diseases with examples of each. Intimacy, Sex, and Sexuality differences are reviewed with a discussion on how relationships can change when diagnosed with pulmonary disease. Common sexual concerns are reviewed.   Knowledge Questionnaire Score:   Core Components/Risk Factors/Patient Goals at Admission:     Personal Goals and Risk Factors at Admission - 11/11/15 1001      Core Components/Risk Factors/Patient Goals on Admission    Weight Management Obesity;Weight Loss;Yes   Expected Outcomes Short Term: Continue to assess and modify interventions until short term weight is achieved;Long Term: Adherence to nutrition and physical activity/exercise program aimed toward attainment of established weight goal;Weight Loss: Understanding of general recommendations for a balanced deficit meal plan, which promotes 1-2 lb weight loss per week and includes a negative energy balance of [phone] kcal/d;Understanding recommendations for meals to include 15-35% energy as protein, 25-35% energy from fat, 35-60% energy from carbohydrates, less than 200mg  of dietary cholesterol, 20-35 gm of total fiber daily;Understanding of distribution of calorie intake throughout the day with the consumption of 4-5 meals/snacks   Increase Strength and Stamina Yes   Intervention Provide  advice, education, support and counseling about physical activity/exercise needs.;Develop an individualized exercise prescription for aerobic and resistive training based on initial evaluation findings, risk stratification, comorbidities and participant's personal goals.   Expected Outcomes Achievement of increased cardiorespiratory fitness and enhanced flexibility, muscular endurance and strength shown through measurements of functional capacity and personal statement of participant.   Improve shortness of breath with ADL's Yes   Intervention Provide education, individualized exercise plan and daily activity instruction to help decrease symptoms of SOB with activities of daily living.   Expected Outcomes Short Term: Achieves a reduction of symptoms when performing activities of daily living.   Develop more efficient breathing techniques such as purse lipped breathing and diaphragmatic breathing; and practicing self-pacing with activity Yes   Intervention Provide education, demonstration and support about specific breathing techniuqes utilized for more efficient breathing. Include techniques such as pursed lipped breathing, diaphragmatic breathing and self-pacing activity.   Expected Outcomes Short Term: Participant will be able to demonstrate and use breathing techniques as needed throughout daily activities.      Core Components/Risk Factors/Patient Goals Review:    Core Components/Risk Factors/Patient Goals at Discharge (Final Review):    ITP Comments:   Comments:

## 2015-11-20 ENCOUNTER — Encounter (HOSPITAL_COMMUNITY): Payer: Self-pay | Admitting: *Deleted

## 2015-11-25 ENCOUNTER — Ambulatory Visit: Payer: 59

## 2015-11-26 ENCOUNTER — Encounter (HOSPITAL_COMMUNITY)
Admission: RE | Admit: 2015-11-26 | Discharge: 2015-11-26 | Disposition: A | Discharge status: Home / Self Care | Payer: 59 | Source: Ambulatory Visit | Attending: Internal Medicine | Admitting: Internal Medicine

## 2015-11-26 VITALS — Wt 198.0 lb

## 2015-11-26 DIAGNOSIS — D860 Sarcoidosis of lung: Secondary | ICD-10-CM | POA: Diagnosis not present

## 2015-11-26 NOTE — Progress Notes (Signed)
Daily Session Note  Patient Details  Name: Tracy Barnes MRN: [id number] Date of Birth: 20-Feb-1956 Referring Provider:   April Manson Pulmonary Rehab Walk Test from 11/19/2015 in [place]  Referring Provider  Dr. Chase Caller      Encounter Date: 11/26/2015  Check In:     Session Check In - 11/26/15 1020      Check-In   Location [facility]   Staff Present Rosebud Poles, RN, BSN;Asheton Scheffler Ysidro Evert, Felipe Drone, RN, MHA;Molly diVincenzo, MS, ACSM RCEP, Exercise Physiologist   Supervising physician immediately available to respond to emergencies [facility] immediately available   Physician(s) Dr. Marily Memos   Medication changes reported     No   Fall or balance concerns reported    No   Warm-up and Cool-down Performed as group-led instruction   Resistance Training Performed Yes   VAD Patient? No     Pain Assessment   Currently in Pain? No/denies   Multiple Pain Sites No      Capillary Blood Glucose: No results found for this or any previous visit (from the past 24 hour(s)).      Exercise Prescription Changes - 11/26/15 1200      Exercise Review   Progression Yes     Response to Exercise   Blood Pressure (Admit) 128/70   Blood Pressure (Exercise) 140/80   Blood Pressure (Exit) 122/80   Heart Rate (Admit) 78 bpm   Heart Rate (Exercise) 107 bpm   Heart Rate (Exit) 85 bpm   Oxygen Saturation (Admit) 96 %   Oxygen Saturation (Exercise) 95 %   Oxygen Saturation (Exit) 97 %   Rating of Perceived Exertion (Exercise) 13   Perceived Dyspnea (Exercise) 1   Duration Progress to 45 minutes of aerobic exercise without signs/symptoms of physical distress   Intensity THRR unchanged     Progression   Progression Continue to progress workloads to maintain intensity without signs/symptoms of physical distress.     Resistance Training   Training Prescription Yes   Weight orange bands   Reps 10-12  10 minutes of  strength training     Treadmill   MPH 1.7   Grade 0   Minutes 17     Bike   Level 0.8   Minutes 17     NuStep   Level 2   Minutes 17     Goals Met:  Exercise tolerated well No report of cardiac concerns or symptoms Strength training completed today  Goals Unmet:  Not Applicable  Comments: Service time is from 1030 to 1200    Dr. Rush Farmer is Medical Director for Pulmonary Rehab at [facility].

## 2015-11-28 ENCOUNTER — Encounter (HOSPITAL_COMMUNITY)
Admission: RE | Admit: 2015-11-28 | Discharge: 2015-11-28 | Disposition: A | Discharge status: Home / Self Care | Payer: 59 | Source: Ambulatory Visit | Attending: Internal Medicine | Admitting: Internal Medicine

## 2015-11-28 VITALS — Wt 197.5 lb

## 2015-11-28 DIAGNOSIS — D860 Sarcoidosis of lung: Secondary | ICD-10-CM

## 2015-11-28 DIAGNOSIS — R0600 Dyspnea, unspecified: Secondary | ICD-10-CM

## 2015-11-28 NOTE — Progress Notes (Signed)
Incomplete Session Note  Patient Details  Name: Tracy Barnes MRN: [id number] Date of Birth: 1955/12/15 Referring Provider:   April Manson Pulmonary Rehab Walk Test from 11/19/2015 in 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  She attended the nutrition class, but was not able to attend the exercise session due to work commitment.

## 2015-12-03 ENCOUNTER — Encounter (HOSPITAL_COMMUNITY)
Admission: RE | Admit: 2015-12-03 | Discharge: 2015-12-03 | Disposition: A | Discharge status: Home / Self Care | Payer: 59 | Source: Ambulatory Visit | Attending: Internal Medicine | Admitting: Internal Medicine

## 2015-12-03 DIAGNOSIS — R0600 Dyspnea, unspecified: Secondary | ICD-10-CM

## 2015-12-03 DIAGNOSIS — D860 Sarcoidosis of lung: Secondary | ICD-10-CM

## 2015-12-03 NOTE — Progress Notes (Signed)
Pulmonary Individual Treatment Plan  Patient Details  Name: Tracy Barnes MRN: [id number] Date of Birth: [birth year] Referring Provider:   April Manson Pulmonary Rehab Walk Test from 11/19/2015 in [place]  Referring Provider  Dr. Chase Caller      Initial Encounter Date:  Flowsheet Row Pulmonary Rehab Walk Test from 11/19/2015 in [place]  Date  11/19/15  Referring Provider  Dr. 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  ALPRAZolam (XANAX) 0.25 MG tablet, Take 0.25 mg by mouth at bedtime as needed.  , Disp: , Rfl:  .  ATORVASTATIN CALCIUM PO, Take 20 mg by mouth daily. , Disp: , Rfl:  .  Calcium-Magnesium-Vitamin D (CITRACAL SLOW RELEASE PO), Take 1 tablet by mouth daily., Disp: , Rfl:  .  desvenlafaxine (PRISTIQ) 100 MG 24 hr tablet, Take 100 mg by mouth daily., Disp: , Rfl:  .  fluticasone (FLONASE) 50 MCG/ACT nasal spray, Place 2 sprays into both nostrils daily as needed., Disp: , Rfl:  .  hydrochlorothiazide (HYDRODIURIL) 12.5 MG tablet, Take 12.5 mg by mouth daily., Disp: , Rfl:  .  levothyroxine (SYNTHROID, LEVOTHROID) 100 MCG tablet, Take 100 mcg by mouth daily before breakfast., Disp: , Rfl:  .  metFORMIN (GLUCOPHAGE) 500 MG tablet, Take 500 mg by mouth 2 (two) times daily with a meal. , Disp: , Rfl:  .  pantoprazole (PROTONIX) 40 MG tablet, TAKE 1 TABLET BY MOUTH ONCE DAILY, Disp: 90 tablet, Rfl: 1 .  TRUE METRIX BLOOD GLUCOSE TEST test strip, , Disp: , Rfl: 6 .  TRUEPLUS LANCETS 30G MISC, , Disp: , Rfl: 6  Past Medical History: Past Medical History:  Diagnosis Date  . Cancer ([address]) 02/19/2010   left breast  . Chest pain   . Diabetes mellitus   . Dyspnea on exertion   . GERD (gastroesophageal reflux disease)   . Hx of radiation therapy 06/18/10 to 08/01/10   L breast  . Hyperlipidemia   .  Hypertension   . Thyroid disease     Tobacco Use: History  Smoking Status  . Former Smoker  . Packs/day: 0.10  . Years: 0.50  . Types: Cigarettes  . Quit date: 04/20/1977  Smokeless Tobacco  . Never Used    Labs: Recent Review Flowsheet Data    Labs for ITP Cardiac and Pulmonary Rehab 03/05/2015   Hemoglobin A1c 6.3      Capillary Blood Glucose: Lab Results  Component Value Date   GLUCAP 154 (H) 12/27/2013   GLUCAP 114 (H) 12/27/2013   GLUCAP 100 (H) 12/27/2013   GLUCAP 108 (H) 12/15/2013     ADL [facility]:     Pulmonary Assessment Scores    Row Name 11/20/15 0923         ADL [facility]   ADL Phase Entry     SOB Score total 78        Pulmonary Function Assessment:     Pulmonary Function Assessment - 11/11/15 0958      Breath   Bilateral Breath Sounds Other   Other course in the bases   Shortness of Breath Yes;Limiting activity      Exercise Target Goals:    Exercise Program Goal: Individual exercise prescription set with THRR, safety & activity barriers. Participant demonstrates ability to understand and report RPE using BORG scale, to self-measure pulse accurately, and to  acknowledge the importance of the exercise prescription.  Exercise Prescription Goal: Starting with aerobic activity 30 plus minutes a day, 3 days per week for initial exercise prescription. Provide home exercise prescription and guidelines that participant acknowledges understanding prior to discharge.  Activity Barriers & Risk Stratification:     Activity Barriers & Cardiac Risk Stratification - 11/11/15 0957      Activity Barriers & Cardiac Risk Stratification   Activity Barriers Deconditioning;Shortness of Breath      6 Minute Walk:     6 Minute Walk    Row Name 11/19/15 1529         6 Minute Walk   Phase Initial     Distance 1820 feet     Walk Time 6 minutes     # of Rest Breaks 0     MPH 3.44     METS 3.6     RPE 13     Perceived Dyspnea  2     Symptoms No      Resting HR 81 bpm     Resting BP 122/70     Max Ex. HR 112 bpm     Max Ex. BP 160/80     2 Minute Post BP 150/84       Interval HR   Baseline HR 81     1 Minute HR 104     2 Minute HR 112     3 Minute HR 111     4 Minute HR 109     5 Minute HR 107     6 Minute HR 107     2 Minute Post HR 89     Interval Heart Rate? Yes       Interval Oxygen   Interval Oxygen? Yes     Baseline Oxygen Saturation % 95 %     Baseline Liters of Oxygen 0 L     1 Minute Oxygen Saturation % 96 %     1 Minute Liters of Oxygen 0 L     2 Minute Oxygen Saturation % 96 %     2 Minute Liters of Oxygen 0 L     3 Minute Oxygen Saturation % 95 %     3 Minute Liters of Oxygen 0 L     4 Minute Oxygen Saturation % 95 %     4 Minute Liters of Oxygen 0 L     5 Minute Oxygen Saturation % 95 %     5 Minute Liters of Oxygen 0 L     6 Minute Oxygen Saturation % 96 %     6 Minute Liters of Oxygen 0 L     2 Minute Post Oxygen Saturation % 97 %     2 Minute Post Liters of Oxygen 0 L        Initial Exercise Prescription:     Initial Exercise Prescription - 11/19/15 1500      Date of Initial Exercise RX and Referring Provider   Date 11/19/15   Referring Provider Dr. Chase Caller     Treadmill   MPH 1.7   Grade 0   Minutes 17     Bike   Level 0.5   Minutes 17     NuStep   Level 2   Minutes 17   METs 1.7     Prescription Details   Frequency (times per week) 2   Duration Progress to 45 minutes of aerobic exercise without signs/symptoms of physical distress  Intensity   THRR 40-80% of Max Heartrate 65-131   Ratings of Perceived Exertion 11-13   Perceived Dyspnea 0-4     Progression   Progression Continue progressive overload as per policy without signs/symptoms or physical distress.     Resistance Training   Training Prescription Yes   Weight orange   Reps 10-12      Perform Capillary Blood Glucose checks as needed.  Exercise Prescription Changes:     Exercise Prescription Changes     Row Name 11/26/15 1200             Exercise Review   Progression Yes         Response to Exercise   Blood Pressure (Admit) 128/70       Blood Pressure (Exercise) 140/80       Blood Pressure (Exit) 122/80       Heart Rate (Admit) 78 bpm       Heart Rate (Exercise) 107 bpm       Heart Rate (Exit) 85 bpm       Oxygen Saturation (Admit) 96 %       Oxygen Saturation (Exercise) 95 %       Oxygen Saturation (Exit) 97 %       Rating of Perceived Exertion (Exercise) 13       Perceived Dyspnea (Exercise) 1       Duration Progress to 45 minutes of aerobic exercise without signs/symptoms of physical distress       Intensity THRR unchanged         Progression   Progression Continue to progress workloads to maintain intensity without signs/symptoms of physical distress.         Resistance Training   Training Prescription Yes       Weight orange bands       Reps 10-12  10 minutes of strength training         Treadmill   MPH 1.7       Grade 0       Minutes 17         Bike   Level 0.8       Minutes 17         NuStep   Level 2       Minutes 17          Exercise Comments:     Exercise Comments    Row Name 12/03/15 0744           Exercise Comments Patient has only attended one exercise session. Will cont. to monitor.           Discharge Exercise Prescription (Final Exercise Prescription Changes):     Exercise Prescription Changes - 11/26/15 1200      Exercise Review   Progression Yes     Response to Exercise   Blood Pressure (Admit) 128/70   Blood Pressure (Exercise) 140/80   Blood Pressure (Exit) 122/80   Heart Rate (Admit) 78 bpm   Heart Rate (Exercise) 107 bpm   Heart Rate (Exit) 85 bpm   Oxygen Saturation (Admit) 96 %   Oxygen Saturation (Exercise) 95 %   Oxygen Saturation (Exit) 97 %   Rating of Perceived Exertion (Exercise) 13   Perceived Dyspnea (Exercise) 1   Duration Progress to 45 minutes of aerobic exercise without signs/symptoms of physical  distress   Intensity THRR unchanged     Progression   Progression Continue to progress workloads to maintain intensity without signs/symptoms of physical  distress.     Resistance Training   Training Prescription Yes   Weight orange bands   Reps 10-12  10 minutes of strength training     Treadmill   MPH 1.7   Grade 0   Minutes 17     Bike   Level 0.8   Minutes 17     NuStep   Level 2   Minutes 17       Nutrition:  Target Goals: Understanding of nutrition guidelines, daily intake of sodium 1500mg , cholesterol 200mg , calories 30% from fat and 7% or less from saturated fats, daily to have 5 or more servings of fruits and vegetables.  Biometrics:     Pre Biometrics - 11/11/15 0959      Pre Biometrics   Grip Strength 18 kg       Nutrition Therapy Plan and Nutrition Goals:   Nutrition Discharge: Rate Your Plate Scores:   Psychosocial: Target Goals: Acknowledge presence or absence of depression, maximize coping skills, provide positive support system. Participant is able to verbalize types and ability to use techniques and skills needed for reducing stress and depression.  Initial Review & Psychosocial Screening:     Initial Psych Review & Screening - 11/11/15 1004      Family Dynamics   Good Support System? Yes     Barriers   Psychosocial barriers to participate in program There are no identifiable barriers or psychosocial needs.     Screening Interventions   Interventions Encouraged to exercise      Quality of Life Scores:     Quality of Life - 11/20/15 0923      Quality of Life Scores   Health/Function Pre 27.8 %   Socioeconomic Pre 23.33 %   Psych/Spiritual Pre 29.14 %   Family Pre 30 %   GLOBAL Pre 27.17 %      PHQ-9: Recent Review Flowsheet Data    Depression screen [facility] 2/9 11/11/2015 08/26/2015 07/15/2015 03/05/2015 10/08/2014   Decreased Interest 0 0 0 0 0   Down, Depressed, Hopeless 0 0 0 0 0   PHQ - 2 Score 0 0 0 0 0       Psychosocial Evaluation and Intervention:   Psychosocial Re-Evaluation:     Psychosocial Re-Evaluation    Row Name 12/02/15 [phone]             Psychosocial Re-Evaluation   Interventions Encouraged to attend Pulmonary Rehabilitation for the exercise       Comments There are no psycosocial barriers identified since admission. Will continue to follow         Education: Education Goals: Education classes will be provided on a weekly basis, covering required topics. Participant will state understanding/return demonstration of topics presented.  Learning Barriers/Preferences:     Learning Barriers/Preferences - 11/11/15 [id number]      Learning Barriers/Preferences   Learning Barriers None   Learning Preferences Group Instruction;Individual Instruction;Skilled Demonstration;Verbal Instruction;Written Material;Computer/Internet      Education Topics: Risk Factor Reduction:  -Group instruction that is supported by a PowerPoint presentation. Instructor discusses the definition of a risk factor, different risk factors for pulmonary disease, and how the heart and lungs work together.     Nutrition for Pulmonary Patient:  -Group instruction provided by PowerPoint slides, verbal discussion, and written materials to support subject matter. The instructor gives an explanation and review of healthy diet recommendations, which includes a discussion on weight management, recommendations for fruit and vegetable consumption, as well as protein, fluid, caffeine,  fiber, sodium, sugar, and alcohol. Tips for eating when patients are short of breath are discussed. Flowsheet Row PULMONARY REHAB OTHER RESPIRATORY from 11/28/2015 in [place]  Date  11/28/15  Educator  edna  Instruction Review Code  2- meets goals/outcomes      Pursed Lip Breathing:  -Group instruction that is supported by demonstration and informational handouts. Instructor discusses the benefits of  pursed lip and diaphragmatic breathing and detailed demonstration on how to preform both.     Oxygen Safety:  -Group instruction provided by PowerPoint, verbal discussion, and written material to support subject matter. There is an overview of "What is Oxygen" and "Why do we need it".  Instructor also reviews how to create a safe environment for oxygen use, the importance of using oxygen as prescribed, and the risks of noncompliance. There is a brief discussion on traveling with oxygen and resources the patient may utilize.   Oxygen Equipment:  -Group instruction provided by [facility] Staff utilizing handouts, written materials, and equipment demonstrations.   Signs and Symptoms:  -Group instruction provided by written material and verbal discussion to support subject matter. Warning signs and symptoms of infection, stroke, and heart attack are reviewed and when to call the physician/911 reinforced. Tips for preventing the spread of infection discussed.   Advanced Directives:  -Group instruction provided by verbal instruction and written material to support subject matter. Instructor reviews Advanced Directive laws and proper instruction for filling out document.   Pulmonary Video:  -Group video education that reviews the importance of medication and oxygen compliance, exercise, good nutrition, pulmonary hygiene, and pursed lip and diaphragmatic breathing for the pulmonary patient.   Exercise for the Pulmonary Patient:  -Group instruction that is supported by a PowerPoint presentation. Instructor discusses benefits of exercise, core components of exercise, frequency, duration, and intensity of an exercise routine, importance of utilizing pulse oximetry during exercise, safety while exercising, and options of places to exercise outside of rehab.     Pulmonary Medications:  -Verbally interactive group education provided by instructor with focus on inhaled medications and proper  administration.   Anatomy and Physiology of the Respiratory System and Intimacy:  -Group instruction provided by PowerPoint, verbal discussion, and written material to support subject matter. Instructor reviews respiratory cycle and anatomical components of the respiratory system and their functions. Instructor also reviews differences in obstructive and restrictive respiratory diseases with examples of each. Intimacy, Sex, and Sexuality differences are reviewed with a discussion on how relationships can change when diagnosed with pulmonary disease. Common sexual concerns are reviewed.   Knowledge Questionnaire Score:     Knowledge Questionnaire Score - 11/20/15 0923      Knowledge Questionnaire Score   Pre Score 8/13      Core Components/Risk Factors/Patient Goals at Admission:     Personal Goals and Risk Factors at Admission - 11/11/15 1001      Core Components/Risk Factors/Patient Goals on Admission    Weight Management Obesity;Weight Loss;Yes   Expected Outcomes Short Term: Continue to assess and modify interventions until short term weight is achieved;Long Term: Adherence to nutrition and physical activity/exercise program aimed toward attainment of established weight goal;Weight Loss: Understanding of general recommendations for a balanced deficit meal plan, which promotes 1-2 lb weight loss per week and includes a negative energy balance of [phone] kcal/d;Understanding recommendations for meals to include 15-35% energy as protein, 25-35% energy from fat, 35-60% energy from carbohydrates, less than 200mg  of dietary cholesterol, 20-35 gm of  total fiber daily;Understanding of distribution of calorie intake throughout the day with the consumption of 4-5 meals/snacks   Increase Strength and Stamina Yes   Intervention Provide advice, education, support and counseling about physical activity/exercise needs.;Develop an individualized exercise prescription for aerobic and resistive training  based on initial evaluation findings, risk stratification, comorbidities and participant's personal goals.   Expected Outcomes Achievement of increased cardiorespiratory fitness and enhanced flexibility, muscular endurance and strength shown through measurements of functional capacity and personal statement of participant.   Improve shortness of breath with ADL's Yes   Intervention Provide education, individualized exercise plan and daily activity instruction to help decrease symptoms of SOB with activities of daily living.   Expected Outcomes Short Term: Achieves a reduction of symptoms when performing activities of daily living.   Develop more efficient breathing techniques such as purse lipped breathing and diaphragmatic breathing; and practicing self-pacing with activity Yes   Intervention Provide education, demonstration and support about specific breathing techniuqes utilized for more efficient breathing. Include techniques such as pursed lipped breathing, diaphragmatic breathing and self-pacing activity.   Expected Outcomes Short Term: Participant will be able to demonstrate and use breathing techniques as needed throughout daily activities.      Core Components/Risk Factors/Patient Goals Review:      Goals and Risk Factor Review    Row Name 12/02/15 0933             Core Components/Risk Factors/Patient Goals Review   Personal Goals Review Weight Management/Obesity;Sedentary;Improve shortness of breath with ADL's;Increase Strength and Stamina       Review How to increase strength and stamina, slowly lose weight, reviewed purse lip breathing, will meet with nutritionist soon for one on one session regarding weight lossa       Expected Outcomes Improved strength and stamina, improved shortness of breath and weight loss of 5 pounds in the near future          Core Components/Risk Factors/Patient Goals at Discharge (Final Review):      Goals and Risk Factor Review - 12/02/15 0933       Core Components/Risk Factors/Patient Goals Review   Personal Goals Review Weight Management/Obesity;Sedentary;Improve shortness of breath with ADL's;Increase Strength and Stamina   Review How to increase strength and stamina, slowly lose weight, reviewed purse lip breathing, will meet with nutritionist soon for one on one session regarding weight lossa   Expected Outcomes Improved strength and stamina, improved shortness of breath and weight loss of 5 pounds in the near future      ITP Comments:   Comments: Received a phone call from patient this am. She has only attended 1 full exercise session and 1 education session. She feels this program will not be conducive to her work schedule and has requested to be discharged.

## 2015-12-05 ENCOUNTER — Encounter (HOSPITAL_COMMUNITY): Payer: 59

## 2015-12-10 ENCOUNTER — Encounter (HOSPITAL_COMMUNITY): Payer: 59

## 2015-12-12 ENCOUNTER — Encounter (HOSPITAL_COMMUNITY): Payer: 59

## 2015-12-12 MED FILL — DESVENLAFAXINE SUC ER 100 M: 100 | 90 days supply | Qty: 90 | Fill #1

## 2015-12-12 MED FILL — LEVOTHYROXINE 100 MCG TAB: 100 | 30 days supply | Qty: 30 | Fill #4

## 2015-12-12 MED FILL — HYDROCHLOROTHIAZIDE 12.5 MG: 12.5 | 90 days supply | Qty: 90 | Fill #0

## 2015-12-12 MED FILL — ATORVASTATIN 20 MG TABLET: 20 | 90 days supply | Qty: 90 | Fill #0

## 2015-12-12 MED FILL — ALPRAZolam 0.25 MG TABS: 0.25 | 30 days supply | Qty: 60 | Fill #2

## 2015-12-16 DIAGNOSIS — I10 Essential (primary) hypertension: Secondary | ICD-10-CM | POA: Diagnosis not present

## 2015-12-16 DIAGNOSIS — D860 Sarcoidosis of lung: Secondary | ICD-10-CM | POA: Diagnosis not present

## 2015-12-16 DIAGNOSIS — E784 Other hyperlipidemia: Secondary | ICD-10-CM | POA: Diagnosis not present

## 2015-12-16 DIAGNOSIS — I515 Myocardial degeneration: Secondary | ICD-10-CM | POA: Diagnosis not present

## 2015-12-16 DIAGNOSIS — F329 Major depressive disorder, single episode, unspecified: Secondary | ICD-10-CM | POA: Diagnosis not present

## 2015-12-16 DIAGNOSIS — K635 Polyp of colon: Secondary | ICD-10-CM | POA: Diagnosis not present

## 2015-12-16 DIAGNOSIS — C50919 Malignant neoplasm of unspecified site of unspecified female breast: Secondary | ICD-10-CM | POA: Diagnosis not present

## 2015-12-16 DIAGNOSIS — E119 Type 2 diabetes mellitus without complications: Secondary | ICD-10-CM | POA: Diagnosis not present

## 2015-12-16 DIAGNOSIS — E038 Other specified hypothyroidism: Secondary | ICD-10-CM | POA: Diagnosis not present

## 2015-12-16 MED FILL — ZOLPIDEM TARTRATE 5 MG TAB: 5 | 90 days supply | Qty: 90 | Fill #1

## 2015-12-17 ENCOUNTER — Encounter (HOSPITAL_COMMUNITY): Payer: 59

## 2015-12-19 ENCOUNTER — Encounter (HOSPITAL_COMMUNITY): Payer: 59

## 2015-12-19 NOTE — Addendum Note (Signed)
Encounter addended by: Jewel Baize, RD on: 12/19/2015  3:24 PM<BR>    Actions taken: Visit Navigator Flowsheet section accepted

## 2015-12-19 NOTE — Addendum Note (Signed)
Encounter addended by: Jewel Baize, RD on: 12/19/2015  3:27 PM<BR>    Actions taken: Visit Navigator Flowsheet section accepted

## 2015-12-20 DIAGNOSIS — Z1212 Encounter for screening for malignant neoplasm of rectum: Secondary | ICD-10-CM | POA: Diagnosis not present

## 2015-12-24 ENCOUNTER — Encounter (HOSPITAL_COMMUNITY): Admission: RE | Admit: 2015-12-24 | Payer: 59 | Source: Ambulatory Visit

## 2015-12-24 ENCOUNTER — Telehealth: Payer: Self-pay | Admitting: Internal Medicine

## 2015-12-24 NOTE — Telephone Encounter (Signed)
LM x 1 

## 2015-12-24 NOTE — Telephone Encounter (Signed)
Spoke with pt, wanted to know if she needed to keep her appt for her PFT on Monday.   Pt states she is having SOB but this is improved since her last ov.  I advised pt to keep PFT appt.  Pt expressed understanding.  Nothing further needed.

## 2015-12-26 ENCOUNTER — Encounter (HOSPITAL_COMMUNITY): Payer: Self-pay

## 2015-12-26 ENCOUNTER — Encounter (HOSPITAL_COMMUNITY): Payer: 59

## 2015-12-26 DIAGNOSIS — D863 Sarcoidosis of skin: Secondary | ICD-10-CM | POA: Diagnosis not present

## 2015-12-26 DIAGNOSIS — L821 Other seborrheic keratosis: Secondary | ICD-10-CM | POA: Diagnosis not present

## 2015-12-26 DIAGNOSIS — B081 Molluscum contagiosum: Secondary | ICD-10-CM | POA: Diagnosis not present

## 2015-12-26 MED FILL — CLOBETASOL 0.05% CREAM: 0.05 | 15 days supply | Qty: 45 | Fill #0

## 2015-12-26 NOTE — Progress Notes (Signed)
Discharge Summary  Patient Details  Name: Tracy Barnes MRN: [id number] Date of Birth: 27-Aug-1955 Referring Provider:   April [facility] Walk Test from 11/19/2015 in [place]  Referring Provider  Dr. Chase Caller       Number of Visits: 1  Reason for Discharge:  Early Exit:  Work schedule will not allow patient to attend.  Smoking History:  History  Smoking Status  . Former Smoker  . Packs/day: 0.10  . Years: 0.50  . Types: Cigarettes  . Quit date: 04/20/1977  Smokeless Tobacco  . Never Used    Diagnosis:  No diagnosis found.  ADL UCSD:     Pulmonary Assessment Scores    Row Name 11/20/15 [phone]         ADL UCSD   ADL Phase Entry     SOB Score total 78        Initial Exercise Prescription:     Initial Exercise Prescription - 11/19/15 1500      Date of Initial Exercise RX and Referring Provider   Date 11/19/15   Referring Provider Dr. Chase Caller     Treadmill   MPH 1.7   Grade 0   Minutes 17     Bike   Level 0.5   Minutes 17     NuStep   Level 2   Minutes 17   METs 1.7     Prescription Details   Frequency (times per week) 2   Duration Progress to 45 minutes of aerobic exercise without signs/symptoms of physical distress     Intensity   THRR 40-80% of Max Heartrate 65-131   Ratings of Perceived Exertion 11-13   Perceived Dyspnea 0-4     Progression   Progression Continue progressive overload as per policy without signs/symptoms or physical distress.     Resistance Training   Training Prescription Yes   Weight orange   Reps 10-12      Discharge Exercise Prescription (Final Exercise Prescription Changes):     Exercise Prescription Changes - 11/26/15 1200      Exercise Review   Progression Yes     Response to Exercise   Blood Pressure (Admit) 128/70   Blood Pressure (Exercise) 140/80   Blood Pressure (Exit) 122/80   Heart Rate (Admit) 78 bpm   Heart Rate (Exercise) 107 bpm   Heart  Rate (Exit) 85 bpm   Oxygen Saturation (Admit) 96 %   Oxygen Saturation (Exercise) 95 %   Oxygen Saturation (Exit) 97 %   Rating of Perceived Exertion (Exercise) 13   Perceived Dyspnea (Exercise) 1   Duration Progress to 45 minutes of aerobic exercise without signs/symptoms of physical distress   Intensity THRR unchanged     Progression   Progression Continue to progress workloads to maintain intensity without signs/symptoms of physical distress.     Resistance Training   Training Prescription Yes   Weight orange bands   Reps 10-12  10 minutes of strength training     Treadmill   MPH 1.7   Grade 0   Minutes 17     Bike   Level 0.8   Minutes 17     NuStep   Level 2   Minutes 17      Functional Capacity:     6 Minute Walk    Row Name 11/19/15 1529         6 Minute Walk   Phase Initial     Distance 1820 feet  Walk Time 6 minutes     # of Rest Breaks 0     MPH 3.44     METS 3.6     RPE 13     Perceived Dyspnea  2     Symptoms No     Resting HR 81 bpm     Resting BP 122/70     Max Ex. HR 112 bpm     Max Ex. BP 160/80     2 Minute Post BP 150/84       Interval HR   Baseline HR 81     1 Minute HR 104     2 Minute HR 112     3 Minute HR 111     4 Minute HR 109     5 Minute HR 107     6 Minute HR 107     2 Minute Post HR 89     Interval Heart Rate? Yes       Interval Oxygen   Interval Oxygen? Yes     Baseline Oxygen Saturation % 95 %     Baseline Liters of Oxygen 0 L     1 Minute Oxygen Saturation % 96 %     1 Minute Liters of Oxygen 0 L     2 Minute Oxygen Saturation % 96 %     2 Minute Liters of Oxygen 0 L     3 Minute Oxygen Saturation % 95 %     3 Minute Liters of Oxygen 0 L     4 Minute Oxygen Saturation % 95 %     4 Minute Liters of Oxygen 0 L     5 Minute Oxygen Saturation % 95 %     5 Minute Liters of Oxygen 0 L     6 Minute Oxygen Saturation % 96 %     6 Minute Liters of Oxygen 0 L     2 Minute Post Oxygen Saturation % 97 %     2  Minute Post Liters of Oxygen 0 L        Psychological, QOL, Others - Outcomes: PHQ 2/9: Depression screen [facility] 2/9 11/11/2015 08/26/2015 07/15/2015 03/05/2015 10/08/2014  Decreased Interest 0 0 0 0 0  Down, Depressed, Hopeless 0 0 0 0 0  PHQ - 2 Score 0 0 0 0 0    Quality of Life:     Quality of Life - 11/20/15 0923      Quality of Life Scores   Health/Function Pre 27.8 %   Socioeconomic Pre 23.33 %   Psych/Spiritual Pre 29.14 %   Family Pre 30 %   GLOBAL Pre 27.17 %      Personal Goals: Goals established at orientation with interventions provided to work toward goal.     Personal Goals and Risk Factors at Admission - 11/11/15 1001      Core Components/Risk Factors/Patient Goals on Admission    Weight Management Obesity;Weight Loss;Yes   Expected Outcomes Short Term: Continue to assess and modify interventions until short term weight is achieved;Long Term: Adherence to nutrition and physical activity/exercise program aimed toward attainment of established weight goal;Weight Loss: Understanding of general recommendations for a balanced deficit meal plan, which promotes 1-2 lb weight loss per week and includes a negative energy balance of [phone] kcal/d;Understanding recommendations for meals to include 15-35% energy as protein, 25-35% energy from fat, 35-60% energy from carbohydrates, less than 200mg  of dietary cholesterol, 20-35 gm of total fiber daily;Understanding of  distribution of calorie intake throughout the day with the consumption of 4-5 meals/snacks   Increase Strength and Stamina Yes   Intervention Provide advice, education, support and counseling about physical activity/exercise needs.;Develop an individualized exercise prescription for aerobic and resistive training based on initial evaluation findings, risk stratification, comorbidities and participant's personal goals.   Expected Outcomes Achievement of increased cardiorespiratory fitness and enhanced flexibility,  muscular endurance and strength shown through measurements of functional capacity and personal statement of participant.   Improve shortness of breath with ADL's Yes   Intervention Provide education, individualized exercise plan and daily activity instruction to help decrease symptoms of SOB with activities of daily living.   Expected Outcomes Short Term: Achieves a reduction of symptoms when performing activities of daily living.   Develop more efficient breathing techniques such as purse lipped breathing and diaphragmatic breathing; and practicing self-pacing with activity Yes   Intervention Provide education, demonstration and support about specific breathing techniuqes utilized for more efficient breathing. Include techniques such as pursed lipped breathing, diaphragmatic breathing and self-pacing activity.   Expected Outcomes Short Term: Participant will be able to demonstrate and use breathing techniques as needed throughout daily activities.       Personal Goals Discharge:     Goals and Risk Factor Review    Row Name 12/02/15 0933             Core Components/Risk Factors/Patient Goals Review   Personal Goals Review Weight Management/Obesity;Sedentary;Improve shortness of breath with ADL's;Increase Strength and Stamina       Review How to increase strength and stamina, slowly lose weight, reviewed purse lip breathing, will meet with nutritionist soon for one on one session regarding weight lossa       Expected Outcomes Improved strength and stamina, improved shortness of breath and weight loss of 5 pounds in the near future          Nutrition & Weight - Outcomes:     Pre Biometrics - 11/11/15 0959      Pre Biometrics   Grip Strength 18 kg       Nutrition:   Nutrition Discharge:     Nutrition Assessments - 12/19/15 1524      Rate Your Plate Scores   Pre Score 72      Education Questionnaire Score:     Knowledge Questionnaire Score - 11/20/15 0923       Knowledge Questionnaire Score   Pre Score 8/13      Patient called and stated she is unable to continue with the program due to work scheduling.

## 2015-12-30 ENCOUNTER — Encounter (INDEPENDENT_AMBULATORY_CARE_PROVIDER_SITE_OTHER): Payer: 59 | Admitting: Internal Medicine

## 2015-12-30 ENCOUNTER — Encounter: Payer: Self-pay | Admitting: Internal Medicine

## 2015-12-30 ENCOUNTER — Ambulatory Visit (INDEPENDENT_AMBULATORY_CARE_PROVIDER_SITE_OTHER): Payer: 59 | Admitting: Internal Medicine

## 2015-12-30 VITALS — BP 124/84 | HR 82 | Ht 63.0 in | Wt 197.0 lb

## 2015-12-30 DIAGNOSIS — R0600 Dyspnea, unspecified: Secondary | ICD-10-CM

## 2015-12-30 DIAGNOSIS — R0609 Other forms of dyspnea: Secondary | ICD-10-CM | POA: Diagnosis not present

## 2015-12-30 DIAGNOSIS — D860 Sarcoidosis of lung: Secondary | ICD-10-CM

## 2015-12-30 LAB — PULMONARY FUNCTION TEST
DL/VA % PRED: 120 %
DL/VA: 5.64 ml/min/mmHg/L
DLCO UNC: 21.33 ml/min/mmHg
DLCO cor % pred: 89 %
DLCO cor: 20.61 ml/min/mmHg
DLCO unc % pred: 93 %
FEF 25-75 Post: 3.49 L/sec
FEF 25-75 Pre: 3.54 L/sec
FEF2575-%CHANGE-POST: -1 %
FEF2575-%PRED-POST: 146 %
FEF2575-%Pred-Pre: 148 %
FEV1-%CHANGE-POST: 0 %
FEV1-%Pred-Post: 87 %
FEV1-%Pred-Pre: 86 %
FEV1-PRE: 2.19 L
FEV1-Post: 2.19 L
FEV1FVC-%Change-Post: 1 %
FEV1FVC-%PRED-PRE: 114 %
FEV6-%Change-Post: -1 %
FEV6-%Pred-Post: 75 %
FEV6-%Pred-Pre: 77 %
FEV6-Post: 2.38 L
FEV6-Pre: 2.41 L
FEV6FVC-%PRED-POST: 103 %
FEV6FVC-%Pred-Pre: 103 %
FVC-%CHANGE-POST: -1 %
FVC-%PRED-PRE: 74 %
FVC-%Pred-Post: 73 %
FVC-POST: 2.38 L
FVC-PRE: 2.42 L
Post FEV1/FVC ratio: 92 %
Post FEV6/FVC ratio: 100 %
Pre FEV1/FVC ratio: 91 %
Pre FEV6/FVC Ratio: 100 %
RV % PRED: 76 %
RV: 1.44 L
TLC % pred: 81 %
TLC: 3.99 L

## 2015-12-30 NOTE — Patient Instructions (Addendum)
Dyspnea on exertion Pulmonary sarcoidosis (HCC)   - Shortness of breath: Glad is better after pulmonary rehabilitation. PFT some decline in 2 years but could be normal aging and related to acute cough  - Pulmonary sarcoidosis: Stable mediastinal adenopathy since 2011 through May 2017. Possible mild increase in pulmonary infiltrates versus stability on CT chest May 2017. We will just follow this  - Skin Sarcoid: per Dr Ubaldo Glassing topical steroids (new dx June 2017)    Follow-up - Pulmonary function tests in 12 months (spiro with dlco, no BD responose, no lung volumes) - CT chest wo contrast in 9-12 months

## 2015-12-30 NOTE — Progress Notes (Signed)
Subjective:     Patient ID: Tracy Barnes, female   DOB: 12-08-1955, 60 y.o.   MRN: [id number]  HPI  OV 06/25/2015  Chief Complaint  Patient presents with  . Follow-up    Pt states her breathing has not changed since last OV in 02/2014. Pt states she is aware of a couple spots on her lungs and would like the spots re-evaluated. Pt c/o chest tightness when SOB - resolves with rest. Pt denies cough.    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 She now presents for dyspnea and follow-up of the mediastinal nodes. Last seen in the fall of 2015. Since then she says she has seen cardiology and his been cleared but dyspnea persists. She has not been to pulmonary rehabilitation and seems interested. In terms of her breast cancer she is now on the survivors clinic. In terms of pulmonary nodules she had a follow-up CT chest May 2016 and it was stable. 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 This was also stable on the noncontrast CT chest in May 2016. However she says she wants follow-up CT chest because of persistent dyspnea and  granuloma. Recently in November 2016 and her brother got diagnosed at onset/outset with stage IV lung cancer. He quit smoking 25 years ago. Therefore she is extremely worried given her previous smoking history and medical problems.  Today walking desaturation test 185 feet 3 laps on room air: stayed 100% all 3 laps    OV 07/23/2015  Chief Complaint  Patient presents with  . Follow-up    Pt states her breathing is unchanged since last OV. Pt denies any complaints.    Due to hae fu CT in May 2017 foir persistent dyspnea, crackles on exam and prior stage 1 sarcoid (bx proven sept 2015) but mistakenly asked to come 07/23/2015. She paid her co-pay. She has questions on sarcoid and extension and natural hx. She is having some new non tender small nodules o hand and wrist. PCP asked her to check with me   OV 08/29/2015  Chief Complaint  Patient  presents with  . Follow-up    Pt here after CT chest. Pt states her breathing is unchanged. Pt denies any new complaints.    This no charge visit because she came for follow-up in April 2017 prematurely due to mistaken scheduling.  Follow-up dyspnea : Normal cardiac stress test in 2015 and and also pulmonary stress test showing chronotropic incompetence in November 2015. This in the setting of obesity. In early stage sarcoidosis of the lung system. She continues to be delayed by dyspnea. It is present on exertion relieved by rest. She is going to start pulmonary rehabilitation. She prefers to go to pulmonary rehabilitation and at the end if there is still dyspnea then to repeat pulmonary stress testing.  Follow-up sarcoidosis: She was really worried that she has extensive cancer. Therefore we did CT scan of the chest which is documented below. Mediastinal adenopathy stable. There is possible progression and pulmonary interstitial infiltrates of sarcoidosis versus stability. This could account for worsening dyspnea would be not sure at this point. She prefers to go to pulmonary rehabilitation.  IMPRESSION: CT chest high resolution 1. Patchy perilymphatic distribution nodularity throughout both lungs and fine peribronchovascular interstitial beading, mildly increased since 09/03/2014, most consistent with slight progression of pulmonary sarcoidosis. 2. Stable moderate mediastinal and bilateral hilar lymphadenopathy, consistent with sarcoidosis. 3. Mild air trapping, indicating small airways disease. 4. Two vessel coronary  atherosclerosis. 5. Diffuse hepatic steatosis.   Electronically Signed  By: Ilona Sorrel M.D.  On: 08/20/2015 09:38          IMPRESSION: CT chest with contrast 1. Persistent mediastinal, hilar, and infrahilar adenopathy, previously hypermetabolic on the [id number], previous biopsy from 12/27/2013 demonstrated non caseating granulomatous  inflammation consistent with sarcoidosis. From a purely imaging standpoint, lymphoma could cause a similar appearance, although biopsy results were not supportive of lymphoma. 2. Stable bilateral pulmonary nodules, the largest measuring 1.0 by 0.8 cm. Looking back in 2011 this nodule is of a similar size or very minimally smaller on that exam, and accordingly seems unlikely to represent active malignancy. Given the nodal findings, these nodules are most likely a manifestation of sarcoidosis although surveillance imaging may be warranted given the history of malignancy. 3. Hepatic steatosis. 4. Mild cardiomegaly.   Electronically Signed  By: Van Clines M.D.  On: 08/21/2015 09:14     OV 12/30/2015  Chief Complaint  Patient presents with  . Follow-up    Pt here after PFT. Pt states her breathing is slightly improved. Pt c/o sinus congestion and PND causing a nonprod cough. Pt denies CP/tightness and f/c/s .    60 year old female with sarcoidosis pulmonary in remission. Last seen in May 2017. She was having dyspnea. She opted to pulmonary rehabilitation. This has helped. She's now following up with a Pulmicort function test that shows a mild decline in FVC and FEV1. However she's had cough and cold for the last 2 days. She does not want antibiotics because she does not have fever or green mucus. In the interim Skin bx of left superior medial chest by  DR Rolm Bookbinder on 6/16/`17 - non caseating granuloma c/w sarcoid. She is on topical steroids guided by dermatology. She wants to continue surveillance for her sarcoidosis with [place]. She is requesting a CT scan in a year.or so    has a past medical history of Cancer ([place]) (02/19/2010); Chest pain; Diabetes mellitus; Dyspnea on exertion; GERD (gastroesophageal reflux disease); radiation therapy (06/18/10 to 08/01/10); Hyperlipidemia; Hypertension; and Thyroid disease.   reports that she quit smoking about 38 years ago. Her smoking  use included Cigarettes. She has a 0.05 pack-year smoking history. She has never used smokeless tobacco.  Past Surgical History:  Procedure Laterality Date  . ABDOMINAL HYSTERECTOMY     At 59 yrs of age. One ovary removed. Surgery due to fibroids per medical history form dated 02/25/10.  Marland Kitchen BREAST SURGERY  03/25/2010   left lumpectomy  . DEEP AXILLARY SENTINEL NODE BIOPSY / EXCISION  03/25/2010   4/4 nodes negative  . MEDIASTINOSCOPY N/A 12/27/2013   Procedure: MEDIASTINOSCOPY;  Surgeon: Melrose Nakayama, MD;  Location: [facility];  Service: Thoracic;  Laterality: N/A;  . PORT-A-CATH REMOVAL  06/05/2011   Procedure: MINOR REMOVAL PORT-A-CATH;  Surgeon: Adin Hector, MD;  Location: [facility];  Service: General;  Laterality: N/A;  right  . PORTACATH PLACEMENT  04/18/2010   Dr. Fanny Skates  . VIDEO BRONCHOSCOPY WITH ENDOBRONCHIAL ULTRASOUND N/A 12/27/2013   Procedure: VIDEO BRONCHOSCOPY WITH ENDOBRONCHIAL ULTRASOUND;  Surgeon: Melrose Nakayama, MD;  Location: [place];  Service: Thoracic;  Laterality: N/A;    Allergies  Allergen Reactions  . Codeine Itching    Updated per [place].    Immunization History  Administered Date(s) Administered  . Influenza Split 01/18/2013, 01/08/2014  . Influenza,inj,Quad PF,36+ Mos 01/19/2015, 12/23/2015    Family History  Problem Relation Age of Onset  .  Cancer Mother     Cancer of bladder and brain per medical history form dated 02/25/10.  Marland Kitchen Heart disease Father   . COPD Father   . Aortic stenosis Father      Current Outpatient Prescriptions:  .  ALPRAZolam (XANAX) 0.25 MG tablet, Take 0.25 mg by mouth at bedtime as needed.  , Disp: , Rfl:  .  ATORVASTATIN CALCIUM PO, Take 20 mg by mouth daily. , Disp: , Rfl:  .  desvenlafaxine (PRISTIQ) 100 MG 24 hr tablet, Take 100 mg by mouth daily., Disp: , Rfl:  .  fluticasone (FLONASE) 50 MCG/ACT nasal spray, Place 2 sprays into both nostrils daily as needed., Disp: , Rfl:  .   hydrochlorothiazide (HYDRODIURIL) 12.5 MG tablet, Take 12.5 mg by mouth daily., Disp: , Rfl:  .  levothyroxine (SYNTHROID, LEVOTHROID) 100 MCG tablet, Take 100 mcg by mouth daily before breakfast., Disp: , Rfl:  .  metFORMIN (GLUCOPHAGE) 500 MG tablet, Take 500 mg by mouth 2 (two) times daily with a meal. , Disp: , Rfl:  .  pantoprazole (PROTONIX) 40 MG tablet, TAKE 1 TABLET BY MOUTH ONCE DAILY, Disp: 90 tablet, Rfl: 1 .  TRUE METRIX BLOOD GLUCOSE TEST test strip, , Disp: , Rfl: 6 .  TRUEPLUS LANCETS 30G MISC, , Disp: , Rfl: 6    Review of Systems     Objective:   Physical Exam  Constitutional: She is oriented to person, place, and time. She appears well-developed and well-nourished. No distress.  HENT:  Head: Normocephalic and atraumatic.  Right Ear: External ear normal.  Left Ear: External ear normal.  Mouth/Throat: Oropharynx is clear and moist. No oropharyngeal exudate.  Eyes: Conjunctivae and EOM are normal. Pupils are equal, round, and reactive to light. Right eye exhibits no discharge. Left eye exhibits no discharge. No scleral icterus.  Neck: Normal range of motion. Neck supple. No JVD present. No tracheal deviation present. No thyromegaly present.  Cardiovascular: Normal rate, regular rhythm, normal heart sounds and intact distal pulses.  Exam reveals no gallop and no friction rub.   No murmur heard. Pulmonary/Chest: Effort normal and breath sounds normal. No respiratory distress. She has no wheezes. She has no rales. She exhibits no tenderness.  Abdominal: Soft. Bowel sounds are normal. She exhibits no distension and no mass. There is no tenderness. There is no rebound and no guarding.  Musculoskeletal: Normal range of motion. She exhibits no edema or tenderness.  Lymphadenopathy:    She has no cervical adenopathy.  Neurological: She is alert and oriented to person, place, and time. She has normal reflexes. No cranial nerve deficit. She exhibits normal muscle tone. Coordination  normal.  Skin: Skin is warm and dry. No rash noted. She is not diaphoretic. No erythema. No pallor.  Lupus pernio scattered on chest wall anteriorly  Psychiatric: She has a normal mood and affect. Her behavior is normal. Judgment and thought content normal.  Vitals reviewed.   Vitals:   12/30/15 1127  BP: 124/84  Pulse: 82  SpO2: 98%  Weight: 197 lb (89.4 kg)  Height: 5\' 3"  (1.6 m)        Assessment:       ICD-9-CM ICD-10-CM   1. Dyspnea 786.09 R06.00   2. Pulmonary sarcoidosis ([place]) 135 D86.0    517.8         Plan:       - Shortness of breath: Glad is better after pulmonary rehabilitation. PFT some decline in 2 years but could be  normal aging and related to acute cough  - Pulmonary sarcoidosis: Stable mediastinal adenopathy since 2011 through May 2017. Possible mild increase in pulmonary infiltrates versus stability on CT chest May 2017. We will just follow this  - Skin Sarcoid: per Dr Ubaldo Glassing topical steroids (new dx June 2017)    Follow-up - Pulmonary function tests in 12 months (spiro with dlco, no BD responose, no lung volumes) - CT chest wo contrast in 9-12 months   Dr. Brand Males, M.D., [facility].C.P Pulmonary and Critical Care Medicine Staff Physician [facility] Pager: [phone], If no answer or between  15:00h - 7:00h: call [phone]  12/30/2015 11:52 AM

## 2015-12-31 ENCOUNTER — Ambulatory Visit: Payer: Self-pay

## 2015-12-31 ENCOUNTER — Encounter (HOSPITAL_COMMUNITY): Payer: 59

## 2016-01-02 ENCOUNTER — Encounter (HOSPITAL_COMMUNITY): Payer: 59

## 2016-01-07 ENCOUNTER — Encounter (HOSPITAL_COMMUNITY): Payer: 59

## 2016-01-09 ENCOUNTER — Encounter (HOSPITAL_COMMUNITY): Payer: 59

## 2016-01-14 ENCOUNTER — Encounter (HOSPITAL_COMMUNITY): Payer: 59

## 2016-01-16 ENCOUNTER — Encounter (HOSPITAL_COMMUNITY): Payer: 59

## 2016-01-21 ENCOUNTER — Telehealth: Payer: Self-pay

## 2016-01-21 ENCOUNTER — Encounter: Payer: Self-pay | Admitting: Family Medicine

## 2016-01-21 ENCOUNTER — Encounter (HOSPITAL_COMMUNITY): Payer: 59

## 2016-01-21 ENCOUNTER — Ambulatory Visit (INDEPENDENT_AMBULATORY_CARE_PROVIDER_SITE_OTHER): Payer: 59 | Admitting: Family Medicine

## 2016-01-21 VITALS — BP 130/82 | HR 91 | Temp 97.5°F | Resp 17 | Ht 63.0 in | Wt 199.0 lb

## 2016-01-21 DIAGNOSIS — R81 Glycosuria: Secondary | ICD-10-CM

## 2016-01-21 DIAGNOSIS — N3000 Acute cystitis without hematuria: Secondary | ICD-10-CM

## 2016-01-21 DIAGNOSIS — R300 Dysuria: Secondary | ICD-10-CM

## 2016-01-21 LAB — POCT URINALYSIS DIP (MANUAL ENTRY)
Bilirubin, UA: NEGATIVE
Blood, UA: NEGATIVE
Glucose, UA: 100 — AB
Ketones, POC UA: NEGATIVE
NITRITE UA: NEGATIVE
PH UA: 6
PROTEIN UA: NEGATIVE
Spec Grav, UA: 1.02
Urobilinogen, UA: 0.2

## 2016-01-21 LAB — POC MICROSCOPIC URINALYSIS (UMFC): Mucus: ABSENT

## 2016-01-21 LAB — POCT GLYCOSYLATED HEMOGLOBIN (HGB A1C): HEMOGLOBIN A1C: 6.3

## 2016-01-21 LAB — GLUCOSE, POCT (MANUAL RESULT ENTRY): POC GLUCOSE: 121 mg/dL — AB (ref 70–99)

## 2016-01-21 MED ORDER — NITROFURANTOIN MONOHYD MACRO 100 MG PO CAPS: 100.0000 mg | ORAL_CAPSULE | Freq: Two times a day (BID) | ORAL | 0 refills | Status: AC

## 2016-01-21 MED FILL — LEVOTHYROXINE 100 MCG TAB: 100 | 30 days supply | Qty: 30 | Fill #5

## 2016-01-21 MED FILL — NITROFURANTOIN MONO-MCR 100: 100 | 5 days supply | Qty: 10 | Fill #0

## 2016-01-21 NOTE — Progress Notes (Signed)
Patient ID: Tracy Barnes, female    DOB: 03-21-1956, 61 y.o.   MRN: [id number]  PCP: Sheela Stack, MD  Chief Complaint  Patient presents with  . Dysuria  . Urinary Frequency  . Back Pain    Since Sunday     Subjective:   HPI 60 year old female, presents for evaluation of dysuria with right sided flank pain times 3 days. Patient reports that symptoms began initially as burning which she describes as spasmatic sensation with urination. Two days ago she developed right sided flank pain and experienced persistent lower abdominal pressure. Denies fever, chills, or changes in urine odor/color. She hasn't taken an medication for symptoms. Has increased intake of cranberry juice and water with minimal relief.  Review of Systems See HPI  Patient Active Problem List   Diagnosis Date Noted  . Pulmonary sarcoidosis ([place]) 08/29/2015  . Prediabetes 08/26/2015  . Bibasilar crackles 06/25/2015  . Gastro-esophageal reflux disease without esophagitis 04/23/2015  . Essential hypertension 03/19/2014  . Hyperlipidemia 03/19/2014  . Lung nodule 01/08/2014  . Dyspnea 01/08/2014  . Mediastinal adenopathy 12/19/2013  . Nodule of right lung 12/10/2013  . Dyspnea on exertion 09/01/2013  . Chest pain 09/01/2013  . Breast cancer, left breast ([place]) 05/01/2013  . Hx of radiation therapy   . Skin moles 09/19/2010  . Wears glasses/contacts 09/19/2010  . 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 Menopause 09/19/2010  . Hormone replacement therapy (postmenopausal) 09/19/2010  . PLANTAR FASCIITIS, BILATERAL 02/05/2009  . ABNORMALITY OF GAIT 02/05/2009    Prior to Admission medications   Medication Sig Start Date End Date Taking? Authorizing Provider  ALPRAZolam (XANAX) 0.25 MG tablet Take 0.25 mg by mouth at bedtime as needed.     Yes Historical Provider, MD  ATORVASTATIN CALCIUM PO Take 20 mg by mouth daily.    Yes Historical Provider, MD  desvenlafaxine (PRISTIQ) 100 MG 24  hr tablet Take 100 mg by mouth daily.   Yes Historical Provider, MD  fluticasone (FLONASE) 50 MCG/ACT nasal spray Place 2 sprays into both nostrils daily as needed.   Yes Historical Provider, MD  hydrochlorothiazide (HYDRODIURIL) 12.5 MG tablet Take 12.5 mg by mouth daily.   Yes Historical Provider, MD  levothyroxine (SYNTHROID, LEVOTHROID) 100 MCG tablet Take 100 mcg by mouth daily before breakfast.   Yes Historical Provider, MD  metFORMIN (GLUCOPHAGE) 500 MG tablet Take 500 mg by mouth 2 (two) times daily with a meal.    Yes Historical Provider, MD  pantoprazole (PROTONIX) 40 MG tablet TAKE 1 TABLET BY MOUTH ONCE DAILY 04/23/15  Yes Margarita Rana, MD  TRUE METRIX BLOOD GLUCOSE TEST test strip  09/10/15  Yes Historical Provider, MD  TRUEPLUS LANCETS 30G [place]  09/10/15  Yes Historical Provider, MD    Allergies  Allergen Reactions  . Codeine Itching    Updated per [place].      Objective:  Physical Exam  Constitutional: She is oriented to person, place, and time. She appears well-developed and well-nourished.  HENT:  Head: Normocephalic and atraumatic.  Right Ear: External ear normal.  Left Ear: External ear normal.  Eyes: Conjunctivae are normal. Pupils are equal, round, and reactive to light.  Neck: Normal range of motion.  Cardiovascular: Normal rate, regular rhythm, normal heart sounds and intact distal pulses.   Pulmonary/Chest: Effort normal and breath sounds normal.  Abdominal: Soft. Bowel sounds are normal. She exhibits no distension. There is no tenderness.  Genitourinary:  Genitourinary Comments: Unilateral CVA  tenderness with deep palpation.  CVA Tenderness not  elicited with percussion  Musculoskeletal: Normal range of motion.  Neurological: She is alert and oriented to person, place, and time.  Skin: Skin is warm.  Psychiatric: She has a normal mood and affect. Her behavior is normal. Judgment and thought content normal.     Vitals:   01/21/16 0948  BP: 130/82  Pulse:  91  Resp: 17  Temp: 97.5 F (36.4 C)   Assessment & Plan:  1. Dysuria 2. Acute cystitis without hematuria 3. Glucose found in urine on examination - POCT glucose (manual entry) - POCT glycosylated hemoglobin (Hb A1C)  POCT urinalysis dipstick - POCT Microscopic Urinalysis ([facility]) - Urine culture  60 year old female presents with symptoms consist with acute cystitis although point of care urine diagnostics were unremarkable for an active infection. Will treat empirically as an uncomplicated urinary tract infection. Urine culture pending.  Plan:  . nitrofurantoin, macrocrystal-monohydrate, (MACROBID) 100 MG capsule    Sig: Take 1 capsule (100 mg total) by mouth 2 (two) times daily.   Return for follow-up if symptoms do not improve.  Carroll Sage. Kenton Kingfisher, MSN, FNP-C [facility]

## 2016-01-21 NOTE — Patient Instructions (Addendum)
Start nitrofurantoin 100 mg twice daily for 5 days. Will follow-up with you regarding urine culture.   IF you received an x-ray today, you will receive an invoice from [facility]. Please contact [facility] at [phone] with questions or concerns regarding your invoice.   IF you received labwork today, you will receive an invoice from [employer]. Please contact [facility] at [phone] with questions or concerns regarding your invoice.   Our billing staff will not be able to assist you with questions regarding bills from these companies.  You will be contacted with the lab results as soon as they are available. The fastest way to get your results is to activate your My Chart account. Instructions are located on the last page of this paperwork. If you have not heard from [place] regarding the results in 2 weeks, please contact this office.     Urinary Tract Infection Urinary tract infections (UTIs) can develop anywhere along your urinary tract. Your urinary tract is your body's drainage system for removing wastes and extra water. Your urinary tract includes two kidneys, two ureters, a bladder, and a urethra. Your kidneys are a pair of bean-shaped organs. Each kidney is about the size of your fist. They are located below your ribs, one on each side of your spine. CAUSES Infections are caused by microbes, which are microscopic organisms, including fungi, viruses, and bacteria. These organisms are so small that they can only be seen through a microscope. Bacteria are the microbes that most commonly cause UTIs. SYMPTOMS  Symptoms of UTIs may vary by age and gender of the patient and by the location of the infection. Symptoms in young women typically include a frequent and intense urge to urinate and a painful, burning feeling in the bladder or urethra during urination. Older women and men are more likely to be tired, shaky, and weak and have muscle aches and  abdominal pain. A fever may mean the infection is in your kidneys. Other symptoms of a kidney infection include pain in your back or sides below the ribs, nausea, and vomiting. DIAGNOSIS To diagnose a UTI, your caregiver will ask you about your symptoms. Your caregiver will also ask you to provide a urine sample. The urine sample will be tested for bacteria and white blood cells. White blood cells are made by your body to help fight infection. TREATMENT  Typically, UTIs can be treated with medication. Because most UTIs are caused by a bacterial infection, they usually can be treated with the use of antibiotics. The choice of antibiotic and length of treatment depend on your symptoms and the type of bacteria causing your infection. HOME CARE INSTRUCTIONS  If you were prescribed antibiotics, take them exactly as your caregiver instructs you. Finish the medication even if you feel better after you have only taken some of the medication.  Drink enough water and fluids to keep your urine clear or pale yellow.  Avoid caffeine, tea, and carbonated beverages. They tend to irritate your bladder.  Empty your bladder often. Avoid holding urine for long periods of time.  Empty your bladder before and after sexual intercourse.  After a bowel movement, women should cleanse from front to back. Use each tissue only once. SEEK MEDICAL CARE IF:   You have back pain.  You develop a fever.  Your symptoms do not begin to resolve within 3 days. SEEK IMMEDIATE MEDICAL CARE IF:   You have severe back pain or lower abdominal pain.  You develop chills.  You have nausea or vomiting.  You have continued burning or discomfort with urination. MAKE SURE YOU:   Understand these instructions.  Will watch your condition.  Will get help right away if you are not doing well or get worse.   This information is not intended to replace advice given to you by your health care provider. Make sure you discuss any  questions you have with your health care provider.   Document Released: 01/14/2005 Document Revised: 12/26/2014 Document Reviewed: 05/15/2011 Elsevier Interactive Patient Education [employer].

## 2016-01-21 NOTE — Telephone Encounter (Signed)
Patient would like to know the results of her labs today. I informed patient they're waiting to be reviewed. [phone]

## 2016-01-22 LAB — URINE CULTURE: Organism ID, Bacteria: NO GROWTH

## 2016-01-22 NOTE — Telephone Encounter (Signed)
-----   Message from Sedalia Muta, Real sent at 01/22/2016  2:44 PM EDT ----- Your lab results were as expected. As of today, your urine culture has not grown any bacteria.  Once the urine culture results are complete, I will provide that information to you via my chart.

## 2016-01-22 NOTE — Telephone Encounter (Signed)
Please advise 

## 2016-01-23 ENCOUNTER — Encounter (HOSPITAL_COMMUNITY): Payer: 59

## 2016-01-28 ENCOUNTER — Encounter (HOSPITAL_COMMUNITY): Payer: 59

## 2016-01-30 ENCOUNTER — Encounter (HOSPITAL_COMMUNITY): Payer: 59

## 2016-02-04 ENCOUNTER — Encounter (HOSPITAL_COMMUNITY): Payer: 59

## 2016-02-06 ENCOUNTER — Encounter (HOSPITAL_COMMUNITY): Payer: 59

## 2016-02-06 DIAGNOSIS — D863 Sarcoidosis of skin: Secondary | ICD-10-CM | POA: Diagnosis not present

## 2016-02-07 MED FILL — TRUE METRIX GLUCOSE TEST ST: 33 days supply | Qty: 100 | Fill #1

## 2016-02-07 MED FILL — ALPRAZolam 0.25 MG TABS: 0.25 | 30 days supply | Qty: 60 | Fill #3

## 2016-02-07 MED FILL — METFORMIN HCL ER 500 MG TAB: 500 | 90 days supply | Qty: 270 | Fill #1

## 2016-02-07 MED FILL — TRUEplus LANCETS 30G MISC: 33 days supply | Qty: 100 | Fill #1

## 2016-02-11 ENCOUNTER — Encounter (HOSPITAL_COMMUNITY): Payer: 59

## 2016-02-13 ENCOUNTER — Encounter (HOSPITAL_COMMUNITY): Payer: 59

## 2016-02-17 ENCOUNTER — Other Ambulatory Visit: Payer: Self-pay

## 2016-02-17 VITALS — BP 134/84 | HR 84 | Resp 14 | Ht 63.0 in | Wt 195.6 lb

## 2016-02-17 DIAGNOSIS — R7303 Prediabetes: Secondary | ICD-10-CM

## 2016-02-17 NOTE — Patient Outreach (Signed)
[facility]) Care Management   02/17/2016  Tracy Barnes April 19, 1956 [id number]  Tracy Barnes is an 60 y.o. female.  Member seen for follow up office visit for Link to Wellness program for self management of prediabetes  Subjective: Member states that she has had some burning with urination and frequent urination.  States that she went to urgent care and she had sugar in her urine but no UTI.  States that she called Dr. Forde Dandy and he put her on Invokana for 14 days to lower her blood sugars.  States she is to see him in November.  State that she is trying to watch her diet and she is walking 4-5 times a week.  States that her SOB has improved since she started walking.  Objective:   Review of Systems  Genitourinary: Positive for dysuria.  Reviewed glucometer 7 day average-124 14 day average-126 30 day average-136  Physical Exam Today's Vitals   02/17/16 1533 02/17/16 1546  BP: 134/84   Pulse: 84   Resp: 14   SpO2: 96%   Weight: 195 lb 9.6 oz (88.7 kg)   Height: 1.6 m (5\' 3" )   PainSc: 0-No pain 0-No pain   Encounter Medications:   Outpatient Encounter Prescriptions as of 02/17/2016  Medication Sig Note  . ALPRAZolam (XANAX) 0.25 MG tablet Take 0.25 mg by mouth at bedtime as needed.     Marland Kitchen aspirin EC 81 MG tablet Take 81 mg by mouth daily.   . ATORVASTATIN CALCIUM PO Take 20 mg by mouth daily.    Marland Kitchen desvenlafaxine (PRISTIQ) 100 MG 24 hr tablet Take 100 mg by mouth daily.   . fluticasone (FLONASE) 50 MCG/ACT nasal spray Place 2 sprays into both nostrils daily as needed.   . hydrochlorothiazide (HYDRODIURIL) 12.5 MG tablet Take 12.5 mg by mouth daily.   Marland Kitchen levothyroxine (SYNTHROID, LEVOTHROID) 100 MCG tablet Take 100 mcg by mouth daily before breakfast.   . metFORMIN (GLUCOPHAGE) 500 MG tablet Take 500 mg by mouth 2 (two) times daily with a meal.    . pantoprazole (PROTONIX) 40 MG tablet TAKE 1 TABLET BY MOUTH ONCE DAILY   . TRUE METRIX BLOOD GLUCOSE TEST  test strip  11/11/2015: Received from: External Pharmacy  . TRUEPLUS LANCETS 30G MISC  11/11/2015: Received from: External Pharmacy   No facility-administered encounter medications on file as of 02/17/2016.     Functional Status:   In your present state of health, do you have any difficulty performing the following activities: 02/17/2016 08/26/2015  Hearing? N N  Vision? N N  Difficulty concentrating or making decisions? N N  Walking or climbing stairs? N N  Dressing or bathing? N N  Doing errands, shopping? N N  Some recent data might be hidden    Fall/Depression Screening:    PHQ 2/9 Scores 02/17/2016 01/21/2016 11/11/2015 08/26/2015 07/15/2015 03/05/2015 10/08/2014  PHQ - 2 Score 0 0 0 0 0 0 0    Assessment:  Member seen for follow up office visit for Link to Wellness program for self management of Pre-diabetes. Member is maintaining hemoglobin A1C goal of 6.4 or less with reading 6.3% Reports adhering to lower CHO diet. She reports walking 5 times a week for 15-20 minutes at lunch. Member is to follow up with MD about dysuria and glucose in her urine.   Plan:   Plan to walk 15-20 minutes 5 times a week at work and try getting an extra walk in the afternoon.  Plan to check blood sugar 2-3 times  a week with goals of less than 80-130 fasting and less than 140 2 hours after eating Plan to limit carbohydrates to 2-3 (30-45) gm servings a meal and 15 gm at snacks.  Try to have protein with snacks.  Plan to eat a bedtime snack Plan to see Dr. Forde Dandy on 03/13/16 Plan to enroll in [place] program Plan to return to [place] to Wellness on 05/28/16 at 3:30PM.  Plan to cancel when in the [facility] program  [facility] CM Care Plan Problem One   Flowsheet Row Most Recent Value  Care Plan Problem One  Potentilal for elevated blood sugars related to dx of prediabtes  Role Documenting the Problem One  Care Management [place] for Problem One  Active  [facility] Long Term Goal (31-90 days)  Member  will maintain hemoglobin A1C below 6.5 for the next 90 days  [facility] Long Term Goal Start Date  02/17/16 [Continue at goal hemoglobin A1C 6.3%]  Interventions for Problem One Long Term Goal  Instructed on reasons she might have sugar in her urine, Encouraged to keep her blood sugars in goal ranges, Instucted to contact MD if she continues to have dysuria, instructed to drink more fluids, Reviewed CHO counting and portion control,Reinforced to try to lose weight, Reinforced to try to avoid drinks with sugar and soda, Reinforced to try to eat protein with snacks especially at bedtime , Reinforced importance  of regular exercise for  glycemic control, Instructed to keep appointment with Dr.South in November    Amanda Pote RN, [facility] Care Management Coordinator-Link to [place] Management [phone]

## 2016-02-17 NOTE — Patient Instructions (Signed)
1. Plan to walk 15-20 minutes 5 times a week at work and try getting an extra walk in the afternoon.   2. Plan to check blood sugar 2-3 times  a week with goals of less than 80-130 fasting and less than 140 2 hours after eating 3. Plan to limit carbohydrates to 2-3 (30-45) gm servings a meal and 15 gm at snacks.  Try to have protein with snacks.  Plan to eat a bedtime snack 4. Plan to see Dr. Forde Dandy on 03/13/16 5. Plan to enroll in [facility] program 6. Plan to return to Link to Wellness on 05/28/16 at 3:30PM.  Plan to cancel when in the [facility] program

## 2016-02-18 ENCOUNTER — Encounter (HOSPITAL_COMMUNITY): Payer: 59

## 2016-02-20 ENCOUNTER — Encounter (HOSPITAL_COMMUNITY): Payer: 59

## 2016-02-24 MED FILL — LEVOTHYROXINE 100 MCG TAB: 100 | 30 days supply | Qty: 30 | Fill #6

## 2016-02-24 MED FILL — DESVENLAFAXINE SUC ER 100 M: 100 | 90 days supply | Qty: 90 | Fill #2

## 2016-02-24 MED FILL — HYDROCHLOROTHIAZIDE 12.5 MG: 12.5 | 90 days supply | Qty: 90 | Fill #1

## 2016-02-25 ENCOUNTER — Encounter (HOSPITAL_COMMUNITY): Payer: 59

## 2016-02-25 ENCOUNTER — Telehealth: Payer: Self-pay | Admitting: Adult Health

## 2016-02-25 NOTE — Telephone Encounter (Signed)
Patient called expressing need to change her survivorship appt from 03/06/16 to another day.  She was unable to reach anyone in scheduling to get this appt changed.    She would like to be seen on 03/05/16 after her mammogram, but unfortunately, I have no availability that day.   Therefore, I have rescheduled her appt to 03/19/16 at 9:30 am. She voiced understanding of this new appt date/time.  I look forward to seeing her then.     Mike Craze, NP [place] [phone]

## 2016-02-27 ENCOUNTER — Encounter (HOSPITAL_COMMUNITY): Payer: 59

## 2016-03-03 ENCOUNTER — Encounter (HOSPITAL_COMMUNITY): Payer: 59

## 2016-03-05 ENCOUNTER — Ambulatory Visit
Admission: RE | Admit: 2016-03-05 | Discharge: 2016-03-05 | Disposition: A | Discharge status: Home / Self Care | Payer: 59 | Source: Ambulatory Visit | Attending: Nurse Practitioner | Admitting: Nurse Practitioner

## 2016-03-05 ENCOUNTER — Encounter (HOSPITAL_COMMUNITY): Payer: 59

## 2016-03-05 DIAGNOSIS — R928 Other abnormal and inconclusive findings on diagnostic imaging of breast: Secondary | ICD-10-CM | POA: Diagnosis not present

## 2016-03-05 DIAGNOSIS — C50912 Malignant neoplasm of unspecified site of left female breast: Secondary | ICD-10-CM

## 2016-03-06 ENCOUNTER — Encounter: Payer: 59 | Admitting: Adult Health

## 2016-03-06 ENCOUNTER — Encounter: Payer: 59 | Admitting: Nurse Practitioner

## 2016-03-10 ENCOUNTER — Encounter (HOSPITAL_COMMUNITY): Payer: 59

## 2016-03-12 ENCOUNTER — Encounter (HOSPITAL_COMMUNITY): Payer: 59

## 2016-03-16 MED FILL — LEVOTHYROXINE 100 MCG TAB: 100 | 30 days supply | Qty: 30 | Fill #0

## 2016-03-16 MED FILL — ZOLPIDEM TARTRATE 5 MG TAB: 5 | 90 days supply | Qty: 90 | Fill #2

## 2016-03-19 ENCOUNTER — Ambulatory Visit (HOSPITAL_BASED_OUTPATIENT_CLINIC_OR_DEPARTMENT_OTHER): Payer: 59 | Admitting: Adult Health

## 2016-03-19 VITALS — BP 137/78 | HR 82 | Temp 98.1°F | Resp 18 | Ht 63.0 in | Wt 197.0 lb

## 2016-03-19 DIAGNOSIS — Z853 Personal history of malignant neoplasm of breast: Secondary | ICD-10-CM | POA: Diagnosis not present

## 2016-03-19 DIAGNOSIS — D869 Sarcoidosis, unspecified: Secondary | ICD-10-CM | POA: Diagnosis not present

## 2016-03-19 DIAGNOSIS — R0609 Other forms of dyspnea: Secondary | ICD-10-CM

## 2016-03-19 DIAGNOSIS — Z171 Estrogen receptor negative status [ER-]: Principal | ICD-10-CM

## 2016-03-19 DIAGNOSIS — R51 Headache: Secondary | ICD-10-CM | POA: Diagnosis not present

## 2016-03-19 DIAGNOSIS — C50912 Malignant neoplasm of unspecified site of left female breast: Secondary | ICD-10-CM

## 2016-03-19 NOTE — Progress Notes (Signed)
CLINIC:  Survivorship   REASON FOR VISIT:  Routine follow-up for history of breast cancer.   BRIEF ONCOLOGIC HISTORY:    Breast cancer, left breast (Tracy Barnes)   02/07/2010 Mammogram    Left breast: calcifications; further imaging needed.      02/19/2010 Breast US    Left breast: Two groups of suspicious microcalcifications in the inner lower quadrant.      02/19/2010 Initial Biopsy    Left breast needle biopsy: Focal invasive ductal carcinoma with high-grade DCIS ER- (0%), PR- (0%), HER-2 amplified (ratio 2.79), Ki67 60%:      02/25/2010 Breast MRI    Left breast: biopsy change in lower inner quadrant associated with minimal enhancement in the periphery at the biopsy sites. Right breast unremarkable.      02/25/2010 Clinical Stage    Stage 1: T1 N0      03/25/2010 Definitive Surgery    Left breast lumpectomy: High-grade DCIS with calcifications, 1.5 cm, 4 benign lymph nodes. No invasive disease noted at time of resection.      03/25/2010 Pathologic Stage    Stage IA: T1 N0      04/16/2010 - 04/17/2011 Chemotherapy    Adjuvant chemotherapy Tracy Barnes): Taxol/ Herceptin x 9 cycles followed by 1 year Herceptin maintenance completed 04/17/2011      06/18/2010 - 08/04/2010 Radiation Therapy    Adjuvant RT completed Tracy Barnes). Left breast: 45 Gy over 25 fractions; Left breast boost 16 Gy over 8 fractions.  Total dose: 60 Gy        INTERVAL HISTORY:  Tracy Barnes presents to the [facility] today for routine follow-up for her history of breast cancer.  Overall, she reports feeling quite well.   She has some shortness of breath with exertion due to her sarcoidosis. She states that her breathing seems to slowly be improving.  She makes an effort to walk during her work day by walking on the treadmill for about 20 minutes/day.    She has joint pain in her hands.  She has occasional headaches, that she states is "nothing major."  She does have vision changes with these  headaches. She feels like they are happening more often and thinks they may be due to stress.  Excedrin migraine medication helps alleviate the headaches.  She also has hot flashes; the Pristiq helps her with this.    Otherwise, she is doing well. Denies any breast complaints or changes.     REVIEW OF SYSTEMS:  Review of Systems  Constitutional: Negative.   HENT:  Negative.   Eyes: Negative.   Respiratory: Positive for shortness of breath (with exertion).   Cardiovascular: Negative.   Gastrointestinal: Negative.   Endocrine: Positive for hot flashes.  Genitourinary: Negative.  Negative for vaginal bleeding.   Musculoskeletal: Positive for arthralgias.  Neurological: Positive for headaches.  Hematological: Negative.   Psychiatric/Behavioral: Negative.   Breast: Denies any new nodularity, masses, tenderness, nipple changes, or nipple discharge.    A 14-point review of systems was completed and was negative, except as noted above.    PAST MEDICAL/SURGICAL HISTORY:  Past Medical History:  Diagnosis Date  . Cancer ([place]) 02/19/2010   left breast  . Chest pain   . Diabetes mellitus   . Dyspnea on exertion   . GERD (gastroesophageal reflux disease)   . Hx of radiation therapy 06/18/10 to 08/01/10   L breast  . Hyperlipidemia   . Hypertension   . Thyroid disease    Past Surgical History:  Procedure  Laterality Date  . ABDOMINAL HYSTERECTOMY     At 61 yrs of age. One ovary removed. Surgery due to fibroids per medical history form dated 02/25/10.  Marland Kitchen BREAST SURGERY  03/25/2010   left lumpectomy  . DEEP AXILLARY SENTINEL NODE BIOPSY / EXCISION  03/25/2010   4/4 nodes negative  . MEDIASTINOSCOPY N/A 12/27/2013   Procedure: MEDIASTINOSCOPY;  Surgeon: Melrose Nakayama, MD;  Location: [facility];  Service: Thoracic;  Laterality: N/A;  . PORT-A-CATH REMOVAL  06/05/2011   Procedure: MINOR REMOVAL PORT-A-CATH;  Surgeon: Adin Hector, MD;  Location: [facility];  Service:  General;  Laterality: N/A;  right  . PORTACATH PLACEMENT  04/18/2010   Dr. Fanny Skates  . VIDEO BRONCHOSCOPY WITH ENDOBRONCHIAL ULTRASOUND N/A 12/27/2013   Procedure: VIDEO BRONCHOSCOPY WITH ENDOBRONCHIAL ULTRASOUND;  Surgeon: Melrose Nakayama, MD;  Location: [facility];  Service: Thoracic;  Laterality: N/A;     ALLERGIES:  Allergies  Allergen Reactions  . Codeine Itching    Updated per [place].     CURRENT MEDICATIONS:  Outpatient Encounter Prescriptions as of 03/19/2016  Medication Sig Note  . ALPRAZolam (XANAX) 0.25 MG tablet Take 0.25 mg by mouth at bedtime as needed.     Marland Kitchen aspirin EC 81 MG tablet Take 81 mg by mouth daily.   . ATORVASTATIN CALCIUM PO Take 20 mg by mouth daily.    Marland Kitchen desvenlafaxine (PRISTIQ) 100 MG 24 hr tablet Take 100 mg by mouth daily.   . fluticasone (FLONASE) 50 MCG/ACT nasal spray Place 2 sprays into both nostrils daily as needed.   . hydrochlorothiazide (HYDRODIURIL) 12.5 MG tablet Take 12.5 mg by mouth daily.   Marland Kitchen levothyroxine (SYNTHROID, LEVOTHROID) 100 MCG tablet Take 100 mcg by mouth daily before breakfast.   . metFORMIN (GLUCOPHAGE) 500 MG tablet Take 500 mg by mouth 2 (two) times daily with a meal.    . pantoprazole (PROTONIX) 40 MG tablet TAKE 1 TABLET BY MOUTH ONCE DAILY   . TRUE METRIX BLOOD GLUCOSE TEST test strip  11/11/2015: Received from: External Pharmacy  . TRUEPLUS LANCETS 30G MISC  11/11/2015: Received from: External Pharmacy   No facility-administered encounter medications on file as of 03/19/2016.      ONCOLOGIC FAMILY HISTORY:  Family History  Problem Relation Age of Onset  . Cancer Mother     Cancer of bladder and brain per medical history form dated 02/25/10.  Marland Kitchen Heart disease Father   . COPD Father   . Aortic stenosis Father     GENETIC COUNSELING/TESTING: No results available for review.   SOCIAL HISTORY:  Tracy Barnes is widowed and lives alone in [place], [place].  She has 2 daughters and 1 stepdaughter; she has 4  granddaughters (ages 14, 73, 48, & 6 months).  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  She denies any current tobacco, alcohol, or illicit drug use.    PHYSICAL EXAMINATION:  Vital Signs: Vitals:   03/19/16 0948  BP: 137/78  Pulse: 82  Resp: 18  Temp: 98.1 F (36.7 C)   Filed Weights   03/19/16 0948  Weight: 197 lb (89.4 kg)   General: Well-nourished, well-appearing female in no acute distress.  Unaccompanied today.   HEENT: Head is normocephalic.  Pupils equal and reactive to light. Conjunctivae clear without exudate.  Sclerae anicteric. Oral mucosa is pink, moist.  Oropharynx is pink without lesions or erythema.  Lymph: No cervical, supraclavicular, or infraclavicular lymphadenopathy noted on palpation.  Cardiovascular: Regular  rate and rhythm.Marland Kitchen Respiratory: Clear to auscultation bilaterally. Chest expansion symmetric; breathing non-labored.  Breast Exam:  -Left breast: No appreciable masses on palpation. No skin redness, thickening, or peau d'orange appearance; no nipple retraction or nipple discharge; healed scar without erythema or nodularity.  -Right breast: No appreciable masses on palpation. No skin redness, thickening, or peau d'orange appearance; no nipple retraction or nipple discharge. -Axilla: No axillary adenopathy bilaterally.  GI: Abdomen soft and round; non-tender, non-distended. Bowel sounds normoactive. No hepatosplenomegaly.   GU: Deferred.  Neuro: No focal deficits. Steady gait.  Psych: Mood and affect normal and appropriate for situation.  Extremities: No edema. Skin: Warm and dry.  LABORATORY DATA:  None for this visit.    DIAGNOSTIC IMAGING:  Most recent mammogram: 03/05/16 CLINICAL DATA:  Left lumpectomy 7 years ago.  Annual mammography.  EXAM: 2D DIGITAL DIAGNOSTIC BILATERAL MAMMOGRAM WITH CAD AND ADJUNCT TOMO  COMPARISON:  Previous exam(s).  ACR Breast Density Category b: There are scattered areas of fibroglandular  density.  FINDINGS: The left lumpectomy site is stable. No suspicious masses, calcifications, or distortion.  Mammographic images were processed with CAD.  IMPRESSION: No mammographic evidence of malignancy.  RECOMMENDATION: Annual diagnostic mammography.  I have discussed the findings and recommendations with the patient. Results were also provided in writing at the conclusion of the visit. If applicable, a reminder letter will be sent to the patient regarding the next appointment.  BI-RADS CATEGORY  2: Benign.     ASSESSMENT AND PLAN:  Ms.. Barnes is a pleasant 60 y.o. female with history of Stage IA left breast invasive ductal carcinoma, ER-/PR-/HER2+, diagnosed in 02/2010;  treated with lumpectomy, adjuvant chemotherapy with Taxol/Herceptin x 9 cycles, followed by adjuvant radiation therapy. She completed maintenance Herceptin on 04/17/11 to complete 1 year of therapy. She [facility] to [facility] for surveillance and routine follow-up.   1. History of Stage IA left breast cancer:  Tracy Barnes is currently clinically and radiographically without evidence of disease or recurrence of breast cancer. She will be due for mammogram in 02/2017; orders placed today. She will return to the cancer center to see Survivorship NP in 02/2017.  I encouraged her to call me with any questions or concerns before her next visit at the cancer center, and I would be happy to see her sooner, if needed.    2. Bone health:  Given Tracy Barnes age & history of breast cancer, she is at risk for bone demineralization.  We do not have any DEXA scan records available for review.  Since she has not required anti-estrogen therapy that may further weaken her bones, I will defer any future bone mineral density testing to her PCP, as clinically indicated. In the meantime, she was encouraged to increase her consumption of foods rich in calcium, as well as increase her weight-bearing activities.   She was given education on specific food and activities to promote bone health.  3. Health maintenance and wellness promotion: Tracy Barnes was encouraged to consume 5-7 servings of fruits and vegetables per day. She was also encouraged to engage in moderate to vigorous exercise for 30 minutes per day most days of the week. She was instructed to limit her alcohol consumption and continue to abstain from tobacco use.    Dispo:  -Annual mammogram due in 02/2017; orders placed today.  -Return to cancer center to see Survivorship NP in 02/2017.   A total of 20 minutes of face-to-face time was spent with this patient with greater  than 50% of that time in counseling and care-coordination.   Mike Craze, NP Survivorship Program [place] [phone]   Note: PRIMARY CARE PROVIDER Sheela Stack, [place] [phone]

## 2016-03-26 ENCOUNTER — Encounter: Payer: Self-pay | Admitting: Adult Health

## 2016-03-31 MED FILL — ATORVASTATIN 20 MG TABLET: 20 | 90 days supply | Qty: 90 | Fill #1

## 2016-04-05 ENCOUNTER — Emergency Department
Admission: EM | Admit: 2016-04-05 | Discharge: 2016-04-05 | Disposition: A | Discharge status: Home / Self Care | Payer: 59 | Attending: Student in an Organized Health Care Education/Training Program | Admitting: Student in an Organized Health Care Education/Training Program

## 2016-04-05 DIAGNOSIS — Z23 Encounter for immunization: Secondary | ICD-10-CM | POA: Diagnosis not present

## 2016-04-05 DIAGNOSIS — S61011A Laceration without foreign body of right thumb without damage to nail, initial encounter: Secondary | ICD-10-CM | POA: Insufficient documentation

## 2016-04-05 DIAGNOSIS — Y9389 Activity, other specified: Secondary | ICD-10-CM | POA: Insufficient documentation

## 2016-04-05 DIAGNOSIS — I10 Essential (primary) hypertension: Secondary | ICD-10-CM | POA: Insufficient documentation

## 2016-04-05 DIAGNOSIS — E119 Type 2 diabetes mellitus without complications: Secondary | ICD-10-CM | POA: Diagnosis not present

## 2016-04-05 DIAGNOSIS — Y999 Unspecified external cause status: Secondary | ICD-10-CM | POA: Diagnosis not present

## 2016-04-05 DIAGNOSIS — Z853 Personal history of malignant neoplasm of breast: Secondary | ICD-10-CM | POA: Insufficient documentation

## 2016-04-05 DIAGNOSIS — Z7984 Long term (current) use of oral hypoglycemic drugs: Secondary | ICD-10-CM | POA: Insufficient documentation

## 2016-04-05 DIAGNOSIS — Z87891 Personal history of nicotine dependence: Secondary | ICD-10-CM | POA: Insufficient documentation

## 2016-04-05 DIAGNOSIS — Y92000 Kitchen of unspecified non-institutional (private) residence as  the place of occurrence of the external cause: Secondary | ICD-10-CM | POA: Diagnosis not present

## 2016-04-05 DIAGNOSIS — W260XXA Contact with knife, initial encounter: Secondary | ICD-10-CM | POA: Diagnosis not present

## 2016-04-05 DIAGNOSIS — Z7982 Long term (current) use of aspirin: Secondary | ICD-10-CM | POA: Insufficient documentation

## 2016-04-05 MED ORDER — TETANUS-DIPHTH-ACELL PERTUSSIS 5-2.5-18.5 LF-MCG/0.5 IM SUSP
0.5000 mL | Freq: Once | INTRAMUSCULAR | Status: AC
  Administered 2016-04-05: 0.5 mL via INTRAMUSCULAR
  Filled 2016-04-05: qty 0.5

## 2016-04-05 MED ORDER — LIDOCAINE HCL (PF) 1 % IJ SOLN
10.0000 mL | Freq: Once | INTRAMUSCULAR | Status: AC
  Administered 2016-04-05: 10 mL
  Filled 2016-04-05: qty 10

## 2016-04-05 NOTE — ED Provider Notes (Signed)
[facility] Emergency Department Provider Note  ____________________________________________  Time seen: Approximately 7:54 PM  I have reviewed the triage vital signs and the nursing notes.   HISTORY  Chief Complaint Laceration    HPI Tracy Barnes is a 60 y.o. female who presents emergency department complaining of a laceration to her right thumb. Patient states that she was in the kitchen cooking when she had a knife to her daughter. She reports that she did not "let go of the knife fast enough" and sustained alaceration to the thumb. Patient reports that bleeding will continue as soon as pressure is let off. She denies any spurting bleeding. Full range of motion to thumb. No loss of sensation. Patient denies any other injury or complaint. She is unsure of her last tetanus shot. No medications prior to arrival.   Past Medical History:  Diagnosis Date  . Cancer ([place]) 02/19/2010   left breast  . Chest pain   . Diabetes mellitus   . Dyspnea on exertion   . GERD (gastroesophageal reflux disease)   . Hx of radiation therapy 06/18/10 to 08/01/10   L breast  . Hyperlipidemia   . Hypertension   . Thyroid disease     Patient Active Problem List   Diagnosis Date Noted  . 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 Prediabetes 08/26/2015  . Bibasilar crackles 06/25/2015  . Gastro-esophageal reflux disease without esophagitis 04/23/2015  . Essential hypertension 03/19/2014  . Hyperlipidemia 03/19/2014  . Lung nodule 01/08/2014  . Dyspnea 01/08/2014  . Mediastinal adenopathy 12/19/2013  . Nodule of right lung 12/10/2013  . Dyspnea on exertion 09/01/2013  . Chest pain 09/01/2013  . Breast cancer, left breast ([place]) 05/01/2013  . Hx of radiation therapy   . Skin moles 09/19/2010  . Wears glasses/contacts 09/19/2010  . 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 Menopause 09/19/2010  . Hormone replacement therapy (postmenopausal) 09/19/2010   . PLANTAR FASCIITIS, BILATERAL 02/05/2009  . ABNORMALITY OF GAIT 02/05/2009    Past Surgical History:  Procedure Laterality Date  . ABDOMINAL HYSTERECTOMY     At 55 yrs of age. One ovary removed. Surgery due to fibroids per medical history form dated 02/25/10.  Marland Kitchen BREAST SURGERY  03/25/2010   left lumpectomy  . DEEP AXILLARY SENTINEL NODE BIOPSY / EXCISION  03/25/2010   4/4 nodes negative  . MEDIASTINOSCOPY N/A 12/27/2013   Procedure: MEDIASTINOSCOPY;  Surgeon: Melrose Nakayama, MD;  Location: [facility];  Service: Thoracic;  Laterality: N/A;  . PORT-A-CATH REMOVAL  06/05/2011   Procedure: MINOR REMOVAL PORT-A-CATH;  Surgeon: Adin Hector, MD;  Location: [facility];  Service: General;  Laterality: N/A;  right  . PORTACATH PLACEMENT  04/18/2010   Dr. Fanny Skates  . VIDEO BRONCHOSCOPY WITH ENDOBRONCHIAL ULTRASOUND N/A 12/27/2013   Procedure: VIDEO BRONCHOSCOPY WITH ENDOBRONCHIAL ULTRASOUND;  Surgeon: Melrose Nakayama, MD;  Location: [facility];  Service: Thoracic;  Laterality: N/A;    Prior to Admission medications   Medication Sig Start Date End Date Taking? Authorizing Provider  ALPRAZolam (XANAX) 0.25 MG tablet Take 0.25 mg by mouth at bedtime as needed.      Historical Provider, MD  aspirin EC 81 MG tablet Take 81 mg by mouth daily.    Historical Provider, MD  ATORVASTATIN CALCIUM PO Take 20 mg by mouth daily.     Historical Provider, MD  desvenlafaxine (PRISTIQ) 100 MG 24 hr tablet Take 100 mg by mouth daily.  Historical Provider, MD  fluticasone (FLONASE) 50 MCG/ACT nasal spray Place 2 sprays into both nostrils daily as needed.    Historical Provider, MD  hydrochlorothiazide (HYDRODIURIL) 12.5 MG tablet Take 12.5 mg by mouth daily.    Historical Provider, MD  levothyroxine (SYNTHROID, LEVOTHROID) 100 MCG tablet Take 100 mcg by mouth daily before breakfast.    Historical Provider, MD  metFORMIN (GLUCOPHAGE) 500 MG tablet Take 500 mg by mouth 2 (two) times daily with  a meal.     Historical Provider, MD  pantoprazole (PROTONIX) 40 MG tablet TAKE 1 TABLET BY MOUTH ONCE DAILY 04/23/15   Margarita Rana, MD  TRUE METRIX BLOOD GLUCOSE TEST test strip  09/10/15   Historical Provider, MD  TRUEPLUS LANCETS 30G [place]  09/10/15   Historical Provider, MD    Allergies Codeine  Family History  Problem Relation Age of Onset  . Cancer Mother     Cancer of bladder and brain per medical history form dated 02/25/10.  Marland Kitchen Heart disease Father   . COPD Father   . Aortic stenosis Father     Social History Social History  Substance Use Topics  . Smoking status: Former Smoker    Packs/day: 0.10    Years: 0.50    Types: Cigarettes    Quit date: 04/20/1977  . Smokeless tobacco: Never Used  . Alcohol use No     Review of Systems  Constitutional: No fever/chills Cardiovascular: no chest pain. Respiratory: no cough. No SOB. Musculoskeletal: Negative for musculoskeletal pain. Skin: Positive for laceration to the right thumb Neurological: Negative for headaches, focal weakness or numbness. 10-point ROS otherwise negative.  ____________________________________________   PHYSICAL EXAM:  VITAL SIGNS: ED Triage Vitals  Enc Vitals Group     BP 04/05/16 1930 (!) 158/76     Pulse Rate 04/05/16 1930 91     Resp 04/05/16 1930 18     Temp 04/05/16 1930 98.2 F (36.8 C)     Temp Source 04/05/16 1930 Oral     SpO2 04/05/16 1930 98 %     Weight 04/05/16 1930 195 lb (88.5 kg)     Height 04/05/16 1930 5\' 3"  (1.6 m)     Head Circumference --      Peak Flow --      Pain Score 04/05/16 1937 2     Pain Loc --      Pain Edu? --      Excl. in [place]? --      Constitutional: Alert and oriented. Well appearing and in no acute distress. Eyes: Conjunctivae are normal. PERRL. EOMI. Head: Atraumatic. Neck: No stridor.    Cardiovascular: Normal rate, regular rhythm. Normal S1 and S2.  Good peripheral circulation. Respiratory: Normal respiratory effort without tachypnea or  retractions. Lungs CTAB. Good air entry to the bases with no decreased or absent breath sounds. Musculoskeletal: Full range of motion to all extremities. No gross deformities appreciated. Neurologic:  Normal speech and language. No gross focal neurologic deficits are appreciated.  Skin:  Skin is warm, dry and intact. No rash noted. 3 cm laceration noted to the medial thumb. Edges are smooth. No foreign body. Bleeding at this time but no pulsatile bleeding. Cap refill and sensation intact distally. Full range of motion to thumb. Psychiatric: Mood and affect are normal. Speech and behavior are normal. Patient exhibits appropriate insight and judgement.   ____________________________________________   LABS (all labs ordered are listed, but only abnormal results are displayed)  Labs Reviewed - No data  to display ____________________________________________  EKG   ____________________________________________  RADIOLOGY   No results found.  ____________________________________________    PROCEDURES  Procedure(s) performed:    Marland KitchenMarland KitchenLaceration Repair Date/Time: 04/05/2016 7:58 PM Performed by: Betha Loa D Authorized by: Betha Loa D   Consent:    Consent obtained:  Verbal   Consent given by:  Patient   Risks discussed:  Pain Anesthesia (see MAR for exact dosages):    Anesthesia method:  Nerve block   Block needle gauge:  27 G   Block anesthetic:  Lidocaine 1% w/o epi   Block technique:  Digital block   Block injection procedure:  Anatomic landmarks identified, introduced needle, negative aspiration for blood and incremental injection   Block outcome:  Anesthesia achieved Laceration details:    Location:  Finger   Finger location:  R thumb   Length (cm):  3 Repair type:    Repair type:  Simple Pre-procedure details:    Preparation:  Patient was prepped and draped in usual sterile fashion Exploration:    Hemostasis achieved with:  Direct pressure    Wound exploration: wound explored through full range of motion and entire depth of wound probed and visualized     Wound extent: no foreign bodies/material noted, no muscle damage noted, no nerve damage noted and no tendon damage noted     Contaminated: no   Treatment:    Area cleansed with:  Betadine   Amount of cleaning:  Extensive   Irrigation solution:  Sterile saline   Irrigation method:  Syringe Skin repair:    Repair method:  Sutures   Suture size:  4-0   Suture material:  Nylon   Suture technique:  Simple interrupted   Number of sutures:  7 Approximation:    Approximation:  Close Post-procedure details:    Dressing:  Non-adherent dressing and tube gauze   Patient tolerance of procedure:  Tolerated well, no immediate complications       Medications  lidocaine (PF) (XYLOCAINE) 1 % injection 10 mL (not administered)  Tdap (BOOSTRIX) injection 0.5 mL (not administered)     ____________________________________________   INITIAL IMPRESSION / ASSESSMENT AND PLAN / ED COURSE  Pertinent labs & imaging results that were available during my care of the patient were reviewed by me and considered in my medical decision making (see chart for details).  Review of the [place] CSRS was performed in accordance of the [place] prior to dispensing any controlled drugs.  Clinical Course     Patient's diagnosis is consistent with Laceration to the right thumb. This is closed as described above. Patient's tetanus shot is updated at this time.. Patient to take Tylenol or Motrin if pain returns. Patient will follow up with primary care in 1 week for suture removal. Patient is given ED precautions to return to the ED for any worsening or new symptoms.     ____________________________________________  FINAL CLINICAL IMPRESSION(S) / ED DIAGNOSES  Final diagnoses:  Laceration of right thumb without foreign body without damage to nail, initial encounter      NEW MEDICATIONS STARTED DURING  THIS VISIT:  New Prescriptions   No medications on file        This chart was dictated using voice recognition software/Dragon. Despite best efforts to proofread, errors can occur which can change the meaning. Any change was purely unintentional.    Darletta Moll, PA-C 04/05/16 2037    Merlyn Lot, MD 04/05/16 [phone]

## 2016-04-05 NOTE — ED Notes (Signed)
Pt says she was handing off a knife and was injured; laceration to end of right thumb that she cannot get to stop bleeding;

## 2016-04-05 NOTE — ED Triage Notes (Signed)
Pt presents with laceration to right thumb while cooking super. Unsure of last tetanus shot. Bleeding currently controlled.

## 2016-05-01 DIAGNOSIS — D863 Sarcoidosis of skin: Secondary | ICD-10-CM | POA: Diagnosis not present

## 2016-05-01 DIAGNOSIS — L905 Scar conditions and fibrosis of skin: Secondary | ICD-10-CM | POA: Diagnosis not present

## 2016-05-01 DIAGNOSIS — L570 Actinic keratosis: Secondary | ICD-10-CM | POA: Diagnosis not present

## 2016-05-01 DIAGNOSIS — L821 Other seborrheic keratosis: Secondary | ICD-10-CM | POA: Diagnosis not present

## 2016-05-12 DIAGNOSIS — H5213 Myopia, bilateral: Secondary | ICD-10-CM | POA: Diagnosis not present

## 2016-05-12 MED FILL — METFORMIN HCL ER 500 MG TAB: 500 | 90 days supply | Qty: 270 | Fill #2

## 2016-05-12 MED FILL — HYDROCHLOROTHIAZIDE 12.5 MG: 12.5 | 90 days supply | Qty: 90 | Fill #2

## 2016-05-12 MED FILL — LEVOTHYROXINE 100 MCG TABLE: 100 | 90 days supply | Qty: 90 | Fill #1

## 2016-05-12 MED FILL — DESVENLAFAXINE SUC ER 100 M: 100 | 90 days supply | Qty: 90 | Fill #0

## 2016-05-13 MED FILL — ALPRAZolam 0.25 MG TABS: 0.25 | 30 days supply | Qty: 60 | Fill #0

## 2016-05-18 MED FILL — ACCU-CHEK FASTCLIX LANCETS: 30 days supply | Qty: 102 | Fill #0

## 2016-05-18 MED FILL — ACCU-CHEK GUIDE TEST STRIP: 30 days supply | Qty: 100 | Fill #0

## 2016-05-20 DIAGNOSIS — R509 Fever, unspecified: Secondary | ICD-10-CM | POA: Diagnosis not present

## 2016-05-20 DIAGNOSIS — K635 Polyp of colon: Secondary | ICD-10-CM | POA: Diagnosis not present

## 2016-05-20 DIAGNOSIS — E784 Other hyperlipidemia: Secondary | ICD-10-CM | POA: Diagnosis not present

## 2016-05-20 DIAGNOSIS — F329 Major depressive disorder, single episode, unspecified: Secondary | ICD-10-CM | POA: Diagnosis not present

## 2016-05-20 DIAGNOSIS — C50912 Malignant neoplasm of unspecified site of left female breast: Secondary | ICD-10-CM | POA: Diagnosis not present

## 2016-05-20 DIAGNOSIS — E038 Other specified hypothyroidism: Secondary | ICD-10-CM | POA: Diagnosis not present

## 2016-05-20 DIAGNOSIS — I10 Essential (primary) hypertension: Secondary | ICD-10-CM | POA: Diagnosis not present

## 2016-05-20 DIAGNOSIS — E119 Type 2 diabetes mellitus without complications: Secondary | ICD-10-CM | POA: Diagnosis not present

## 2016-05-20 DIAGNOSIS — D860 Sarcoidosis of lung: Secondary | ICD-10-CM | POA: Diagnosis not present

## 2016-05-20 DIAGNOSIS — Z1389 Encounter for screening for other disorder: Secondary | ICD-10-CM | POA: Diagnosis not present

## 2016-05-20 MED FILL — AZITHROMYCIN 250 MG TABLET: 250 | 5 days supply | Qty: 6 | Fill #0

## 2016-05-28 ENCOUNTER — Ambulatory Visit: Payer: Self-pay

## 2016-06-16 MED FILL — ALPRAZolam 0.25 MG TABS: 0.25 | 30 days supply | Qty: 60 | Fill #1

## 2016-06-16 MED FILL — ZOLPIDEM TARTRATE 5 MG TAB: 5 | 90 days supply | Qty: 90 | Fill #0

## 2016-07-02 MED FILL — ATORVASTATIN 20 MG TABLET: 20 | 90 days supply | Qty: 90 | Fill #0

## 2016-07-03 DIAGNOSIS — L821 Other seborrheic keratosis: Secondary | ICD-10-CM | POA: Diagnosis not present

## 2016-07-03 DIAGNOSIS — L820 Inflamed seborrheic keratosis: Secondary | ICD-10-CM | POA: Diagnosis not present

## 2016-07-03 DIAGNOSIS — D863 Sarcoidosis of skin: Secondary | ICD-10-CM | POA: Diagnosis not present

## 2016-07-03 DIAGNOSIS — L814 Other melanin hyperpigmentation: Secondary | ICD-10-CM | POA: Diagnosis not present

## 2016-07-03 DIAGNOSIS — D2261 Melanocytic nevi of right upper limb, including shoulder: Secondary | ICD-10-CM | POA: Diagnosis not present

## 2016-07-03 DIAGNOSIS — D225 Melanocytic nevi of trunk: Secondary | ICD-10-CM | POA: Diagnosis not present

## 2016-07-30 DIAGNOSIS — H1089 Other conjunctivitis: Secondary | ICD-10-CM | POA: Diagnosis not present

## 2016-07-30 MED FILL — LOTEMAX 0.5% EYE DROPS: 0.5 | 30 days supply | Qty: 5 | Fill #0

## 2016-08-17 DIAGNOSIS — Z01419 Encounter for gynecological examination (general) (routine) without abnormal findings: Secondary | ICD-10-CM | POA: Diagnosis not present

## 2016-08-17 DIAGNOSIS — Z6834 Body mass index (BMI) 34.0-34.9, adult: Secondary | ICD-10-CM | POA: Diagnosis not present

## 2016-08-25 MED FILL — LEVOTHYROXINE 100 MCG TABLE: 100 | 90 days supply | Qty: 90 | Fill #2

## 2016-08-25 MED FILL — DESVENLAFAXINE SUC ER 100 M: 100 | 90 days supply | Qty: 90 | Fill #1

## 2016-08-25 MED FILL — ALPRAZolam 0.25 MG TABS: 0.25 | 30 days supply | Qty: 60 | Fill #2

## 2016-09-02 ENCOUNTER — Telehealth: Payer: Self-pay | Admitting: Internal Medicine

## 2016-09-02 NOTE — Telephone Encounter (Signed)
Iremmber seeing it . It came by postal mail. I think I signed it and gave to [place]; but maybe not. Please do another one and I will sign it 09/03/16 . Please apologize for delay on my part  Dr. Brand Males, M.D., [facility].C.P Pulmonary and Critical Care Medicine Staff Physician [facility] Pager: [phone], If no answer or between  15:00h - 7:00h: call [phone]  09/02/2016 5:57 PM

## 2016-09-02 NOTE — Telephone Encounter (Signed)
Spoke with the pt  She states that she is needing handicap placard form filled out  She states mailed this to [place] 2 wks ago  I checked MR's lookat and do not see one  I advised the pt we have those forms here, and will see if MR ok with signing one for her and will call her back  Please advise thanks

## 2016-09-03 NOTE — Telephone Encounter (Signed)
New form has been placed in MR cubby to sign.  Please advise once completed. Will forward to elise to follow up on form.

## 2016-09-04 NOTE — Telephone Encounter (Signed)
The original form is in MR's to-do folder now.

## 2016-09-09 NOTE — Telephone Encounter (Signed)
Patient called concerning the handicap forms, waiting to pick up... Patient contact # [phone]

## 2016-09-09 NOTE — Telephone Encounter (Signed)
Spoke with the pt  I apologized for the delay on the form  She now wants this mailed to her  She wants to know when we place it in the mail Daneil Dan- has he signed it yet?

## 2016-09-15 MED FILL — HYDROCHLOROTHIAZIDE 12.5 MG: 12.5 | 90 days supply | Qty: 90 | Fill #0

## 2016-09-15 MED FILL — PANTOPRAZOLE SOD DR 40 MG T: 40 | 90 days supply | Qty: 180 | Fill #0

## 2016-09-15 MED FILL — ZOLPIDEM TARTRATE 5 MG TAB: 5 | 90 days supply | Qty: 90 | Fill #1

## 2016-09-15 MED FILL — METFORMIN HCL ER 500 MG TAB: 500 | 90 days supply | Qty: 270 | Fill #0

## 2016-09-15 MED FILL — ATORVASTATIN 20 MG TABLET: 20 | 90 days supply | Qty: 90 | Fill #1

## 2016-09-15 NOTE — Telephone Encounter (Signed)
Elise pleae advise on these forms thanks.

## 2016-09-16 NOTE — Telephone Encounter (Signed)
Daneil Dan please advise if this has been completed. Thanks

## 2016-09-17 NOTE — Telephone Encounter (Signed)
Tracy Barnes   Let us know if you need help with this or can we close the message

## 2016-09-17 NOTE — Telephone Encounter (Signed)
Placard has been signed and completed. Called and spoke to pt. She is requesting the placard be placed in outgoing mail. Pt verbalized understanding and denied any further questions or concerns at this time.

## 2016-09-23 ENCOUNTER — Telehealth: Payer: Self-pay | Admitting: Internal Medicine

## 2016-09-23 NOTE — Telephone Encounter (Signed)
Pt states she received the Handicapped Placard in the mail but it looks like it was filled out in correctly.  The are at he bottom where its supposed to have the date signed by the MD it had her DOB written in. Offered to correct this if she was able to bring this by the office. Pt states that she is going to strike through this and put the date that it was mailed to her - advised that this was mailed 09/17/16. Pt states that she will bring this by if any issues with DMV. Nothing further needed.

## 2016-09-30 DIAGNOSIS — E119 Type 2 diabetes mellitus without complications: Secondary | ICD-10-CM | POA: Diagnosis not present

## 2016-09-30 DIAGNOSIS — Z1389 Encounter for screening for other disorder: Secondary | ICD-10-CM | POA: Diagnosis not present

## 2016-09-30 DIAGNOSIS — M79643 Pain in unspecified hand: Secondary | ICD-10-CM | POA: Diagnosis not present

## 2016-09-30 DIAGNOSIS — F329 Major depressive disorder, single episode, unspecified: Secondary | ICD-10-CM | POA: Diagnosis not present

## 2016-09-30 DIAGNOSIS — E038 Other specified hypothyroidism: Secondary | ICD-10-CM | POA: Diagnosis not present

## 2016-09-30 DIAGNOSIS — K635 Polyp of colon: Secondary | ICD-10-CM | POA: Diagnosis not present

## 2016-09-30 DIAGNOSIS — I10 Essential (primary) hypertension: Secondary | ICD-10-CM | POA: Diagnosis not present

## 2016-09-30 DIAGNOSIS — D860 Sarcoidosis of lung: Secondary | ICD-10-CM | POA: Diagnosis not present

## 2016-09-30 DIAGNOSIS — C50919 Malignant neoplasm of unspecified site of unspecified female breast: Secondary | ICD-10-CM | POA: Diagnosis not present

## 2016-09-30 DIAGNOSIS — E784 Other hyperlipidemia: Secondary | ICD-10-CM | POA: Diagnosis not present

## 2016-11-09 ENCOUNTER — Ambulatory Visit (INDEPENDENT_AMBULATORY_CARE_PROVIDER_SITE_OTHER)
Admission: RE | Admit: 2016-11-09 | Discharge: 2016-11-09 | Disposition: A | Discharge status: Home / Self Care | Payer: 59 | Source: Ambulatory Visit | Attending: Internal Medicine | Admitting: Internal Medicine

## 2016-11-09 DIAGNOSIS — R911 Solitary pulmonary nodule: Secondary | ICD-10-CM | POA: Diagnosis not present

## 2016-11-09 DIAGNOSIS — D860 Sarcoidosis of lung: Secondary | ICD-10-CM

## 2016-11-12 ENCOUNTER — Inpatient Hospital Stay: Admission: RE | Admit: 2016-11-12 | Payer: 59 | Source: Ambulatory Visit

## 2016-11-13 NOTE — Progress Notes (Signed)
lmtcb for pt.  

## 2016-11-16 ENCOUNTER — Telehealth: Payer: Self-pay | Admitting: Internal Medicine

## 2016-11-16 NOTE — Telephone Encounter (Signed)
Notes recorded by Brand Males, MD on 11/10/2016 at 2:07 PM EDT CT findings are stable compared to a may 2017 --------- Spoke with pt, aware of results/recs.  Nothing further needed.

## 2016-11-19 DIAGNOSIS — H43811 Vitreous degeneration, right eye: Secondary | ICD-10-CM | POA: Diagnosis not present

## 2016-11-23 ENCOUNTER — Other Ambulatory Visit (HOSPITAL_COMMUNITY): Payer: Self-pay | Admitting: Orthopedic Surgery

## 2016-11-23 DIAGNOSIS — M79641 Pain in right hand: Secondary | ICD-10-CM | POA: Diagnosis not present

## 2016-11-23 DIAGNOSIS — M25532 Pain in left wrist: Secondary | ICD-10-CM | POA: Diagnosis not present

## 2016-11-27 ENCOUNTER — Encounter (HOSPITAL_COMMUNITY): Payer: Self-pay

## 2016-11-27 ENCOUNTER — Ambulatory Visit (HOSPITAL_COMMUNITY)
Admission: RE | Admit: 2016-11-27 | Discharge: 2016-11-27 | Disposition: A | Discharge status: Home / Self Care | Payer: 59 | Source: Ambulatory Visit | Attending: Orthopedic Surgery | Admitting: Orthopedic Surgery

## 2016-11-27 MED FILL — LEVOTHYROXINE 100 MCG TABLE: 100 | 30 days supply | Qty: 30 | Fill #0

## 2016-11-27 MED FILL — ALPRAZolam 0.25 MG TABS: 0.25 | 30 days supply | Qty: 60 | Fill #0

## 2016-11-27 MED FILL — DESVENLAFAXINE SUC ER 100 M: 100 | 90 days supply | Qty: 90 | Fill #2

## 2016-11-27 MED FILL — METFORMIN HCL ER 500 MG TAB: 500 | 90 days supply | Qty: 270 | Fill #1

## 2016-11-27 MED FILL — HYDROCHLOROTHIAZIDE 12.5 MG: 12.5 | 90 days supply | Qty: 90 | Fill #1

## 2016-12-17 MED FILL — ZOLPIDEM TARTRATE 5 MG TAB: 5 | 90 days supply | Qty: 90 | Fill #0

## 2017-01-05 MED FILL — ATORVASTATIN 20 MG TABLET: 20 | 90 days supply | Qty: 90 | Fill #0

## 2017-01-05 MED FILL — LEVOTHYROXINE 100 MCG TABLE: 100 | 90 days supply | Qty: 90 | Fill #1

## 2017-01-20 DIAGNOSIS — H43811 Vitreous degeneration, right eye: Secondary | ICD-10-CM | POA: Diagnosis not present

## 2017-01-25 ENCOUNTER — Other Ambulatory Visit: Payer: Self-pay | Admitting: Endocrinology

## 2017-01-25 ENCOUNTER — Other Ambulatory Visit: Payer: Self-pay | Admitting: Hematology and Oncology

## 2017-01-25 DIAGNOSIS — Z9889 Other specified postprocedural states: Secondary | ICD-10-CM

## 2017-03-08 ENCOUNTER — Ambulatory Visit
Admission: RE | Admit: 2017-03-08 | Discharge: 2017-03-08 | Disposition: A | Discharge status: Home / Self Care | Payer: 59 | Source: Ambulatory Visit | Attending: Hematology and Oncology | Admitting: Hematology and Oncology

## 2017-03-08 DIAGNOSIS — Z9889 Other specified postprocedural states: Secondary | ICD-10-CM

## 2017-03-08 DIAGNOSIS — R928 Other abnormal and inconclusive findings on diagnostic imaging of breast: Secondary | ICD-10-CM | POA: Diagnosis not present

## 2017-03-17 MED FILL — ZOLPIDEM TARTRATE 5 MG TAB: 5 | 90 days supply | Qty: 90 | Fill #1

## 2017-03-17 MED FILL — ALPRAZolam 0.25 MG TABS: 0.25 | 30 days supply | Qty: 60 | Fill #1

## 2017-03-17 MED FILL — DESVENLAFAXINE SUC ER 100 M: 100 | 90 days supply | Qty: 90 | Fill #0

## 2017-03-18 MED FILL — LEVOTHYROXINE 100 MCG TABLE: 100 | 90 days supply | Qty: 90 | Fill #2

## 2017-03-18 MED FILL — ATORVASTATIN 20 MG TABLET: 20 | 90 days supply | Qty: 90 | Fill #1

## 2017-03-19 ENCOUNTER — Ambulatory Visit (HOSPITAL_BASED_OUTPATIENT_CLINIC_OR_DEPARTMENT_OTHER): Payer: 59 | Admitting: Adult Health

## 2017-03-19 ENCOUNTER — Encounter: Payer: 59 | Admitting: Adult Health

## 2017-03-19 ENCOUNTER — Encounter: Payer: Self-pay | Admitting: Adult Health

## 2017-03-19 VITALS — BP 140/83 | HR 81 | Temp 97.5°F | Resp 20 | Ht 63.0 in | Wt 195.4 lb

## 2017-03-19 DIAGNOSIS — Z853 Personal history of malignant neoplasm of breast: Secondary | ICD-10-CM

## 2017-03-19 DIAGNOSIS — Z1239 Encounter for other screening for malignant neoplasm of breast: Secondary | ICD-10-CM

## 2017-03-19 DIAGNOSIS — C50912 Malignant neoplasm of unspecified site of left female breast: Secondary | ICD-10-CM

## 2017-03-19 NOTE — Progress Notes (Signed)
CLINIC:  Survivorship   REASON FOR VISIT:  Routine follow-up for history of breast cancer.   BRIEF ONCOLOGIC HISTORY:    Breast cancer, left breast ([place])   02/07/2010 Mammogram    Left breast: calcifications; further imaging needed.      02/19/2010 Breast US    Left breast: Two groups of suspicious microcalcifications in the inner lower quadrant.      02/19/2010 Initial Biopsy    Left breast needle biopsy: Focal invasive ductal carcinoma with high-grade DCIS ER- (0%), PR- (0%), HER-2 amplified (ratio 2.79), Ki67 60%:      02/25/2010 Breast MRI    Left breast: biopsy change in lower inner quadrant associated with minimal enhancement in the periphery at the biopsy sites. Right breast unremarkable.      02/25/2010 Clinical Stage    Stage 1: T1 N0      03/25/2010 Definitive Surgery    Left breast lumpectomy: High-grade DCIS with calcifications, 1.5 cm, 4 benign lymph nodes. No invasive disease noted at time of resection.      03/25/2010 Pathologic Stage    Stage IA: T1 N0      04/16/2010 - 04/17/2011 Chemotherapy    Adjuvant chemotherapy Truddie Coco): Taxol/ Herceptin x 9 cycles followed by 1 year Herceptin maintenance completed 04/17/2011      06/18/2010 - 08/04/2010 Radiation Therapy    Adjuvant RT completed Pablo Ledger). Left breast: 45 Gy over 25 fractions; Left breast boost 16 Gy over 8 fractions.  Total dose: 60 Gy        INTERVAL HISTORY:  [name] presents to the [facility] today for routine follow-up for her history of breast cancer.  Overall, she reports feeling quite well. She has a PCP she sees regularly.  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      REVIEW OF SYSTEMS:  Review of Systems  Constitutional: Negative for appetite change, chills, diaphoresis, fatigue and fever.  HENT:   Negative for hearing loss and lump/mass.   Eyes: Negative for eye problems and icterus.  Respiratory: Negative for chest  tightness, cough, shortness of breath and wheezing.   Cardiovascular: Negative for chest pain, leg swelling and palpitations.  Gastrointestinal: Negative for abdominal distention and abdominal pain.  Endocrine: Negative for hot flashes.  Musculoskeletal: Negative for arthralgias.  Skin: Negative for itching and rash.  Neurological: Negative for dizziness and headaches.  Hematological: Negative for adenopathy. Does not bruise/bleed easily.  Psychiatric/Behavioral: Negative for depression. The patient is not nervous/anxious.   Breast: Denies any new nodularity, masses, tenderness, nipple changes, or nipple discharge.       PAST MEDICAL/SURGICAL HISTORY:  Past Medical History:  Diagnosis Date  . Cancer ([place]) 02/19/2010   left breast  . Chest pain   . Diabetes mellitus   . Dyspnea on exertion   . GERD (gastroesophageal reflux disease)   . Hx of radiation therapy 06/18/10 to 08/01/10   L breast  . Hyperlipidemia   . Hypertension   . Thyroid disease    Past Surgical History:  Procedure Laterality Date  . ABDOMINAL HYSTERECTOMY     At 73 yrs of age. One ovary removed. Surgery due to fibroids per medical history form dated 02/25/10.  Marland Kitchen BREAST SURGERY  03/25/2010   left lumpectomy  . DEEP AXILLARY SENTINEL NODE BIOPSY / EXCISION  03/25/2010   4/4 nodes negative  . MEDIASTINOSCOPY N/A 12/27/2013   Procedure: MEDIASTINOSCOPY;  Surgeon: Melrose Nakayama, MD;  Location: [facility]  OR;  Service: Thoracic;  Laterality: N/A;  . PORT-A-CATH REMOVAL  06/05/2011   Procedure: MINOR REMOVAL PORT-A-CATH;  Surgeon: Adin Hector, MD;  Location: [facility];  Service: General;  Laterality: N/A;  right  . PORTACATH PLACEMENT  04/18/2010   Dr. Fanny Skates  . VIDEO BRONCHOSCOPY WITH ENDOBRONCHIAL ULTRASOUND N/A 12/27/2013   Procedure: VIDEO BRONCHOSCOPY WITH ENDOBRONCHIAL ULTRASOUND;  Surgeon: Melrose Nakayama, MD;  Location: [place];  Service: Thoracic;  Laterality: N/A;      ALLERGIES:  Allergies  Allergen Reactions  . Codeine Itching    Updated per [place].     CURRENT MEDICATIONS:  Outpatient Encounter Medications as of 03/19/2017  Medication Sig Note  . ALPRAZolam (XANAX) 0.25 MG tablet Take 0.25 mg by mouth at bedtime as needed.     Marland Kitchen aspirin EC 81 MG tablet Take 81 mg by mouth daily.   . ATORVASTATIN CALCIUM PO Take 20 mg by mouth daily.    Marland Kitchen desvenlafaxine (PRISTIQ) 100 MG 24 hr tablet Take 100 mg by mouth daily.   . fluticasone (FLONASE) 50 MCG/ACT nasal spray Place 2 sprays into both nostrils daily as needed.   . hydrochlorothiazide (HYDRODIURIL) 12.5 MG tablet Take 12.5 mg by mouth daily.   Marland Kitchen levothyroxine (SYNTHROID, LEVOTHROID) 100 MCG tablet Take 100 mcg by mouth daily before breakfast.   . metFORMIN (GLUCOPHAGE) 500 MG tablet Take 500 mg by mouth 2 (two) times daily with a meal.    . pantoprazole (PROTONIX) 40 MG tablet TAKE 1 TABLET BY MOUTH ONCE DAILY   . TRUE METRIX BLOOD GLUCOSE TEST test strip  11/11/2015: Received from: External Pharmacy  . TRUEPLUS LANCETS 30G MISC  11/11/2015: Received from: External Pharmacy  . zolpidem (AMBIEN) 5 MG tablet Take 5 mg by mouth at bedtime as needed.    No facility-administered encounter medications on file as of 03/19/2017.      ONCOLOGIC FAMILY HISTORY:  Family History  Problem Relation Age of Onset  . Cancer Mother        Cancer of bladder and brain per medical history form dated 02/25/10.  Marland Kitchen Heart disease Father   . COPD Father   . Aortic stenosis Father     GENETIC COUNSELING/TESTING: Not indicated  SOCIAL HISTORY:  EMBRIE Barnes is widowed and lives alone in [place], [place].  She has 3 children and they live in [place] and [place].  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  She denies any current or history of tobacco, alcohol, or illicit drug use.     PHYSICAL EXAMINATION:  Vital  Signs: Vitals:   03/19/17 1109  BP: 140/83  Pulse: 81  Resp: 20  Temp: (!) 97.5 F (36.4 C)  SpO2: 100%   Filed Weights   03/19/17 1109  Weight: 195 lb 6.4 oz (88.6 kg)   General: Well-nourished, well-appearing female in no acute distress.  Unaccompanied today.   HEENT: Head is normocephalic.  Pupils equal and reactive to light. Conjunctivae clear without exudate.  Sclerae anicteric. Oral mucosa is pink, moist.  Oropharynx is pink without lesions or erythema.  Lymph: No cervical, supraclavicular, or infraclavicular lymphadenopathy noted on palpation.  Cardiovascular: Regular rate and rhythm.Marland Kitchen Respiratory: Clear to auscultation bilaterally. Chest expansion symmetric; breathing non-labored.  Breast Exam:  -Left breast: No appreciable masses on palpation. No skin redness, thickening, or peau d'orange appearance; no nipple retraction or nipple discharge; modd distortion in symmetry  at previous lumpectomy site well healed scar without erythema or nodularity.  -Right breast: No appreciable masses on palpation. No skin redness, thickening, or peau d'orange appearance; no nipple retraction or nipple discharge-Axilla: No axillary adenopathy bilaterally.  GI: Abdomen soft and round; non-tender, non-distended. Bowel sounds normoactive. No hepatosplenomegaly.   GU: Deferred.  Neuro: No focal deficits. Steady gait.  Psych: Mood and affect normal and appropriate for situation.  MSK: No focal spinal tenderness to palpation, full range of motion in bilateral upper extremities Extremities: No edema. Skin: Warm and dry.  LABORATORY DATA:  None for this visit   DIAGNOSTIC IMAGING:  Most recent mammogram:     ASSESSMENT AND PLAN:  Ms.. Gane is a pleasant 61 y.o. 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  She presents to the Survivorship  Clinic for surveillance and routine follow-up.   1. History of breast cancer:  Ms. Cortinas is currently clinically and radiographically without evidence of disease or recurrence of breast cancer. She will be due for mammogram in 03/08/2018; orders placed today.  We discussed that as she is more than 5 years out from surgery, she can graduate from our breast cancer program.  She can be followed by her PCP now.  We will see her on an as needed basis.     2. Bone health:  Given Ms. Kotara's age, history of breast cancer, she is at risk for bone demineralization.  I will defer to her PCP regarding bone density testing and management.  She was given education on specific food and activities to promote bone health.  3. Cancer screening:  Due to Ms. Niemeier's history and her age, she should receive screening for skin cancers, colon cancer. She was encouraged to follow-up with her PCP for appropriate cancer screenings.   4. Health maintenance and wellness promotion: Ms. Fischbach was encouraged to consume 5-7 servings of fruits and vegetables per day. She was also encouraged to engage in moderate to vigorous exercise for 30 minutes per day most days of the week. She was instructed to limit her alcohol consumption and continue to abstain from tobacco use.   Dispo:  -Return to cancer center PRN -Screening mammogram in 02/2018   A total of (30) minutes of face-to-face time was spent with this patient with greater than 50% of that time in counseling and care-coordination.   Gardenia Phlegm, NP Survivorship Program [facility] [phone]   Note: PRIMARY CARE PROVIDER Reynold Bowen, [place] [phone]

## 2017-03-31 ENCOUNTER — Other Ambulatory Visit: Payer: Self-pay

## 2017-03-31 DIAGNOSIS — D860 Sarcoidosis of lung: Secondary | ICD-10-CM | POA: Diagnosis not present

## 2017-03-31 DIAGNOSIS — F329 Major depressive disorder, single episode, unspecified: Secondary | ICD-10-CM | POA: Diagnosis not present

## 2017-03-31 DIAGNOSIS — K760 Fatty (change of) liver, not elsewhere classified: Secondary | ICD-10-CM | POA: Diagnosis not present

## 2017-03-31 DIAGNOSIS — Z1389 Encounter for screening for other disorder: Secondary | ICD-10-CM | POA: Diagnosis not present

## 2017-03-31 DIAGNOSIS — E7849 Other hyperlipidemia: Secondary | ICD-10-CM | POA: Diagnosis not present

## 2017-03-31 DIAGNOSIS — E038 Other specified hypothyroidism: Secondary | ICD-10-CM | POA: Diagnosis not present

## 2017-03-31 DIAGNOSIS — E119 Type 2 diabetes mellitus without complications: Secondary | ICD-10-CM | POA: Diagnosis not present

## 2017-03-31 DIAGNOSIS — E669 Obesity, unspecified: Secondary | ICD-10-CM | POA: Diagnosis not present

## 2017-03-31 DIAGNOSIS — K635 Polyp of colon: Secondary | ICD-10-CM | POA: Diagnosis not present

## 2017-03-31 DIAGNOSIS — M2550 Pain in unspecified joint: Secondary | ICD-10-CM | POA: Diagnosis not present

## 2017-03-31 NOTE — Patient Outreach (Signed)
[facility]) Care Management  03/31/2017  [name] [birth year] [id number]   Case closed in [place].  Member is being followed by the [employer] program Member enrolled in an external program. Peter Garter RN, [facility] Care Management Coordinator-Link to [place] Management [phone]

## 2017-05-24 MED FILL — METFORMIN HCL ER 500 MG TAB: 500 | 90 days supply | Qty: 270 | Fill #2

## 2017-05-24 MED FILL — HYDROCHLOROTHIAZIDE 12.5 MG: 12.5 | 90 days supply | Qty: 90 | Fill #2

## 2017-05-25 MED FILL — ALPRAZolam 0.25 MG TABS: 0.25 | 30 days supply | Qty: 60 | Fill #2

## 2017-05-31 MED FILL — LEVOTHYROXINE 100 MCG TABLE: 100 | 30 days supply | Qty: 30 | Fill #0

## 2017-05-31 MED FILL — ATORVASTATIN 20 MG TABLET: 20 | 30 days supply | Qty: 30 | Fill #0

## 2017-06-03 DIAGNOSIS — D863 Sarcoidosis of skin: Secondary | ICD-10-CM | POA: Diagnosis not present

## 2017-06-03 DIAGNOSIS — L918 Other hypertrophic disorders of the skin: Secondary | ICD-10-CM | POA: Diagnosis not present

## 2017-06-03 DIAGNOSIS — D1801 Hemangioma of skin and subcutaneous tissue: Secondary | ICD-10-CM | POA: Diagnosis not present

## 2017-06-03 DIAGNOSIS — L814 Other melanin hyperpigmentation: Secondary | ICD-10-CM | POA: Diagnosis not present

## 2017-06-03 DIAGNOSIS — D225 Melanocytic nevi of trunk: Secondary | ICD-10-CM | POA: Diagnosis not present

## 2017-06-03 DIAGNOSIS — L821 Other seborrheic keratosis: Secondary | ICD-10-CM | POA: Diagnosis not present

## 2017-06-03 MED FILL — CLOBETASOL 0.05% CREAM: 0.05 | 14 days supply | Qty: 45 | Fill #0

## 2017-06-09 MED FILL — LOTEMAX 0.5% EYE DROPS: 0.5 | 30 days supply | Qty: 5 | Fill #1

## 2017-06-21 MED FILL — ZOLPIDEM TARTRATE 5 MG TAB: 5 | 90 days supply | Qty: 90 | Fill #0

## 2017-06-21 MED FILL — DESVENLAFAXINE SUC ER 100 M: 100 | 90 days supply | Qty: 90 | Fill #1

## 2017-06-24 MED FILL — ATORVASTATIN 20 MG TABLET: 20 | 90 days supply | Qty: 90 | Fill #1

## 2017-06-24 MED FILL — LEVOTHYROXINE 100 MCG TABLE: 100 | 90 days supply | Qty: 90 | Fill #1

## 2017-07-23 ENCOUNTER — Ambulatory Visit (INDEPENDENT_AMBULATORY_CARE_PROVIDER_SITE_OTHER): Payer: Self-pay | Admitting: Family Medicine

## 2017-07-23 VITALS — BP 120/80 | HR 99 | Temp 99.6°F | Wt 183.0 lb

## 2017-07-23 DIAGNOSIS — R059 Cough, unspecified: Secondary | ICD-10-CM

## 2017-07-23 DIAGNOSIS — R05 Cough: Secondary | ICD-10-CM

## 2017-07-23 DIAGNOSIS — J101 Influenza due to other identified influenza virus with other respiratory manifestations: Secondary | ICD-10-CM

## 2017-07-23 DIAGNOSIS — R509 Fever, unspecified: Secondary | ICD-10-CM

## 2017-07-23 LAB — POCT INFLUENZA A/B
INFLUENZA A, POC: POSITIVE — AB
INFLUENZA B, POC: NEGATIVE

## 2017-07-23 MED ORDER — BENZONATATE 100 MG PO CAPS: 100.0000 mg | ORAL_CAPSULE | Freq: Three times a day (TID) | ORAL | 0 refills | Status: DC | PRN

## 2017-07-23 MED ORDER — OSELTAMIVIR PHOSPHATE 75 MG PO CAPS: 75.0000 mg | ORAL_CAPSULE | Freq: Two times a day (BID) | ORAL | 0 refills | Status: AC

## 2017-07-23 NOTE — Patient Instructions (Addendum)
PLAN< 58 home rest- work note provided Good hand hygiene and wipe down surfaces in the home Tylenol or motrin for fatigue, malaise and fever.  Influenza, Adult Influenza ("the flu") is an infection in the lungs, nose, and throat (respiratory tract). It is caused by a virus. The flu causes many common cold symptoms, as well as a high fever and body aches. It can make you feel very sick. The flu spreads easily from person to person (is contagious). Getting a flu shot (influenza vaccination) every year is the best way to prevent the flu. Follow these instructions at home:  Take over-the-counter and prescription medicines only as told by your doctor.  Use a cool mist humidifier to add moisture (humidity) to the air in your home. This can make it easier to breathe.  Rest as needed.  Drink enough fluid to keep your pee (urine) clear or pale yellow.  Cover your mouth and nose when you cough or sneeze.  Wash your hands with soap and water often, especially after you cough or sneeze. If you cannot use soap and water, use hand sanitizer.  Stay home from work or school as told by your doctor. Unless you are visiting your doctor, try to avoid leaving home until your fever has been gone for 24 hours without the use of medicine.  Keep all follow-up visits as told by your doctor. This is important. How is this prevented?  Getting a yearly (annual) flu shot is the best way to avoid getting the flu. You may get the flu shot in late summer, fall, or winter. Ask your doctor when you should get your flu shot.  Wash your hands often or use hand sanitizer often.  Avoid contact with people who are sick during cold and flu season.  Eat healthy foods.  Drink plenty of fluids.  Get enough sleep.  Exercise regularly. Contact a doctor if:  You get new symptoms.  You have: ? Chest pain. ? Watery poop (diarrhea). ? A fever.  Your cough gets worse.  You start to have more mucus.  You feel sick  to your stomach (nauseous).  You throw up (vomit). Get help right away if:  You start to be short of breath or have trouble breathing.  Your skin or nails turn a bluish color.  You have very bad pain or stiffness in your neck.  You get a sudden headache.  You get sudden pain in your face or ear.  You cannot stop throwing up. This information is not intended to replace advice given to you by your health care provider. Make sure you discuss any questions you have with your health care provider. Document Released: 01/14/2008 Document Revised: 09/12/2015 Document Reviewed: 01/29/2015 Elsevier Interactive Patient Education  2017 [employer].

## 2017-07-23 NOTE — Progress Notes (Signed)
Tracy Barnes is a 62 y.o. female who presents with 2 days of symptoms of body aches, chills and fever and cough. She denies any known sick contacts at this time.  Review of Systems  Constitutional: Positive for chills, fever and malaise/fatigue.  HENT: Positive for sore throat.   Eyes: Negative.   Respiratory: Positive for cough.   Cardiovascular: Negative.   Gastrointestinal: Negative.   Genitourinary: Negative.   Musculoskeletal: Positive for myalgias.  Skin: Negative.   Neurological: Negative.   Endo/Heme/Allergies: Negative.   Psychiatric/Behavioral: Negative.      O: Vitals:   07/23/17 0832  BP: 120/80  Pulse: 99  Temp: 99.6 F (37.6 C)  SpO2: 95%   Physical Exam  Constitutional: She is oriented to person, place, and time. She appears well-developed and well-nourished. She is active. She does not appear ill.  HENT:  Head: Normocephalic.  Right Ear: Hearing, tympanic membrane, external ear and ear canal normal.  Left Ear: Hearing, tympanic membrane, external ear and ear canal normal.  Nose: Rhinorrhea present.  Mouth/Throat: Oropharynx is clear and moist.  Eyes: Pupils are equal, round, and reactive to light.  Neck: Normal range of motion. Neck supple.  Cardiovascular: Normal rate and regular rhythm.  Pulmonary/Chest: Effort normal and breath sounds normal. No stridor. She has no decreased breath sounds. She has no wheezes. She has no rhonchi. She has no rales.  Frequent dry non productive cough on exam.  Abdominal: Soft. Bowel sounds are normal.  Musculoskeletal: Normal range of motion.  Lymphadenopathy:    She has no cervical adenopathy.  Neurological: She is alert and oriented to person, place, and time.  Skin: Skin is warm and dry.  Vitals reviewed.   A: 1. Fever, unspecified fever cause   2. Influenza A    P: PLAN< Flu-treatment dose- medication use and indications dicussed with patient 74 home rest- work note provided Good hand hygiene and wipe  down surfaces in the home Tylenol or motrin for fatigue, malaise and fever.  1. Influenza A - oseltamivir (TAMIFLU) 75 MG capsule; Take 1 capsule (75 mg total) by mouth 2 (two) times daily for 5 days.  2. Cough - benzonatate (TESSALON) 100 MG capsule; Take 1-2 capsules (100-200 mg total) by mouth 3 (three) times daily as needed for cough (with full glass of water every 8 hours).  3. Fever, unspecified fever cause - POCT Influenza A/B - oseltamivir (TAMIFLU) 75 MG capsule; Take 1 capsule (75 mg total) by mouth 2 (two) times daily for 5 days.  Results for orders placed or performed in visit on 07/23/17 (from the past 24 hour(s))  POCT Influenza A/B     Status: Abnormal   Collection Time: 07/23/17  8:44 AM  Result Value Ref Range   Influenza A, POC Positive (A) Negative   Influenza B, POC Negative Negative

## 2017-07-26 ENCOUNTER — Telehealth: Payer: Self-pay | Admitting: Emergency Medicine

## 2017-07-26 NOTE — Telephone Encounter (Signed)
Follow up call on visit with [facility]. Spoke with patient doing better still sweating some

## 2017-07-27 ENCOUNTER — Other Ambulatory Visit: Payer: Self-pay

## 2017-07-27 ENCOUNTER — Emergency Department
Admission: EM | Admit: 2017-07-27 | Discharge: 2017-07-27 | Disposition: A | Discharge status: Home / Self Care | Payer: 59 | Attending: Emergency Medicine | Admitting: Emergency Medicine

## 2017-07-27 ENCOUNTER — Emergency Department: Payer: 59

## 2017-07-27 ENCOUNTER — Encounter: Payer: Self-pay | Admitting: Emergency Medicine

## 2017-07-27 DIAGNOSIS — I10 Essential (primary) hypertension: Secondary | ICD-10-CM | POA: Diagnosis not present

## 2017-07-27 DIAGNOSIS — D0592 Unspecified type of carcinoma in situ of left breast: Secondary | ICD-10-CM | POA: Insufficient documentation

## 2017-07-27 DIAGNOSIS — E119 Type 2 diabetes mellitus without complications: Secondary | ICD-10-CM | POA: Insufficient documentation

## 2017-07-27 DIAGNOSIS — N132 Hydronephrosis with renal and ureteral calculous obstruction: Secondary | ICD-10-CM | POA: Diagnosis not present

## 2017-07-27 DIAGNOSIS — R1011 Right upper quadrant pain: Secondary | ICD-10-CM | POA: Diagnosis present

## 2017-07-27 DIAGNOSIS — N200 Calculus of kidney: Secondary | ICD-10-CM | POA: Diagnosis not present

## 2017-07-27 LAB — COMPREHENSIVE METABOLIC PANEL
ALT: 19 U/L (ref 14–54)
ANION GAP: 10 (ref 5–15)
AST: 28 U/L (ref 15–41)
Albumin: 4.4 g/dL (ref 3.5–5.0)
Alkaline Phosphatase: 57 U/L (ref 38–126)
BUN: 14 mg/dL (ref 6–20)
CO2: 24 mmol/L (ref 22–32)
CREATININE: 0.65 mg/dL (ref 0.44–1.00)
Calcium: 9.4 mg/dL (ref 8.9–10.3)
Chloride: 103 mmol/L (ref 101–111)
Glucose, Bld: 191 mg/dL — ABNORMAL HIGH (ref 65–99)
POTASSIUM: 3.8 mmol/L (ref 3.5–5.1)
Sodium: 137 mmol/L (ref 135–145)
Total Bilirubin: 0.8 mg/dL (ref 0.3–1.2)
Total Protein: 7.2 g/dL (ref 6.5–8.1)

## 2017-07-27 LAB — URINALYSIS, COMPLETE (UACMP) WITH MICROSCOPIC
Bacteria, UA: NONE SEEN
Bilirubin Urine: NEGATIVE
GLUCOSE, UA: NEGATIVE mg/dL
KETONES UR: NEGATIVE mg/dL
Leukocytes, UA: NEGATIVE
Nitrite: NEGATIVE
PH: 7 (ref 5.0–8.0)
Protein, ur: NEGATIVE mg/dL
Specific Gravity, Urine: 1.008 (ref 1.005–1.030)

## 2017-07-27 LAB — CBC WITH DIFFERENTIAL/PLATELET
Basophils Absolute: 0 10*3/uL (ref 0–0.1)
Basophils Relative: 1 %
EOS PCT: 2 %
Eosinophils Absolute: 0.1 10*3/uL (ref 0–0.7)
HCT: 46.1 % (ref 35.0–47.0)
Hemoglobin: 15.5 g/dL (ref 12.0–16.0)
LYMPHS ABS: 0.8 10*3/uL — AB (ref 1.0–3.6)
Lymphocytes Relative: 15 %
MCH: 26.9 pg (ref 26.0–34.0)
MCHC: 33.7 g/dL (ref 32.0–36.0)
MCV: 79.8 fL — AB (ref 80.0–100.0)
MONO ABS: 0.3 10*3/uL (ref 0.2–0.9)
Monocytes Relative: 6 %
Neutro Abs: 4.2 10*3/uL (ref 1.4–6.5)
Neutrophils Relative %: 76 %
PLATELETS: 240 10*3/uL (ref 150–440)
RBC: 5.78 MIL/uL — ABNORMAL HIGH (ref 3.80–5.20)
RDW: 14.5 % (ref 11.5–14.5)
WBC: 5.4 10*3/uL (ref 3.6–11.0)

## 2017-07-27 LAB — GLUCOSE, CAPILLARY: Glucose-Capillary: 206 mg/dL — ABNORMAL HIGH (ref 65–99)

## 2017-07-27 LAB — LIPASE, BLOOD: LIPASE: 36 U/L (ref 11–51)

## 2017-07-27 MED ORDER — ONDANSETRON HCL 4 MG/2ML IJ SOLN
INTRAMUSCULAR | Status: AC
  Administered 2017-07-27: 4 mg
  Filled 2017-07-27: qty 2

## 2017-07-27 MED ORDER — MORPHINE SULFATE (PF) 4 MG/ML IV SOLN
INTRAVENOUS | Status: AC
  Administered 2017-07-27: 4 mg
  Filled 2017-07-27: qty 1

## 2017-07-27 MED ORDER — OXYCODONE-ACETAMINOPHEN 5-325 MG PO TABS: 1.0000 | ORAL_TABLET | ORAL | 0 refills | Status: DC | PRN

## 2017-07-27 MED ORDER — ONDANSETRON 4 MG PO TBDP: 4.0000 mg | ORAL_TABLET | Freq: Three times a day (TID) | ORAL | 0 refills | Status: DC | PRN

## 2017-07-27 MED ORDER — KETOROLAC TROMETHAMINE 30 MG/ML IJ SOLN
INTRAMUSCULAR | Status: AC
  Administered 2017-07-27: 30 mg via INTRAVENOUS
  Filled 2017-07-27: qty 1

## 2017-07-27 MED ORDER — KETOROLAC TROMETHAMINE 30 MG/ML IJ SOLN
30.0000 mg | Freq: Once | INTRAMUSCULAR | Status: AC
  Administered 2017-07-27: 30 mg via INTRAVENOUS
  Filled 2017-07-27: qty 1

## 2017-07-27 MED ORDER — KETOROLAC TROMETHAMINE 10 MG PO TABS: 10.0000 mg | ORAL_TABLET | Freq: Four times a day (QID) | ORAL | 0 refills | Status: DC | PRN

## 2017-07-27 NOTE — ED Provider Notes (Signed)
-----------------------------------------   8:14 AM on 07/27/2017 -----------------------------------------  Urinalysis has resulted showing red blood cells otherwise negative.  Urine culture has been sent.  Kidney function is normal.  Patient will be discharged home with urology follow-up.   Harvest Dark, MD 07/27/17 [phone]

## 2017-07-27 NOTE — ED Triage Notes (Addendum)
Pt to triage via w/c; c/o right upper back pain radiating around into abd since last night accomp by nausea; denies hx of same; dx with influenze last Thursday

## 2017-07-27 NOTE — ED Notes (Signed)
Pt discharged home after verbalizing understanding of discharge instructions; nad noted. 

## 2017-07-27 NOTE — ED Notes (Signed)
Pt back from CT. Pt is pain free at this time. BP has improved.

## 2017-07-27 NOTE — ED Provider Notes (Signed)
[facility] Emergency Department Provider Note    First MD Initiated Contact with Patient 07/27/17 [phone]     (approximate)  I have reviewed the triage vital signs and the nursing notes.   HISTORY  Chief Complaint Back Pain and Abdominal Pain    HPI Tracy Barnes is a 62 y.o. female with below list of chronic medical conditions 10 out of 10 right upper quadrant/flank pain which radiates to the groin with associated nausea and one episode of nonbloody emesis which began yesterday.  Patient denies any previous history of kidney stone.  Dysuria hematuria.  Patient was diagnosed with influenza on 07/23/2017   Past Medical History:  Diagnosis Date  . Cancer ([place]) 02/19/2010   left breast  . Chest pain   . Diabetes mellitus   . Dyspnea on exertion   . GERD (gastroesophageal reflux disease)   . Hx of radiation therapy 06/18/10 to 08/01/10   L breast  . Hyperlipidemia   . Hypertension   . Thyroid disease     Patient Active Problem List   Diagnosis Date Noted  . 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 Prediabetes 08/26/2015  . Bibasilar crackles 06/25/2015  . Gastro-esophageal reflux disease without esophagitis 04/23/2015  . Essential hypertension 03/19/2014  . Hyperlipidemia 03/19/2014  . Lung nodule 01/08/2014  . Dyspnea 01/08/2014  . Mediastinal adenopathy 12/19/2013  . Nodule of right lung 12/10/2013  . Dyspnea on exertion 09/01/2013  . Chest pain 09/01/2013  . Breast cancer, left breast ([place]) 05/01/2013  . Hx of radiation therapy   . Skin moles 09/19/2010  . Wears glasses/contacts 09/19/2010  . 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 Menopause 09/19/2010  . Hormone replacement therapy (postmenopausal) 09/19/2010  . PLANTAR FASCIITIS, BILATERAL 02/05/2009  . ABNORMALITY OF GAIT 02/05/2009    Past Surgical History:  Procedure Laterality Date  . ABDOMINAL HYSTERECTOMY     At 73 yrs of age. One ovary removed. Surgery due  to fibroids per medical history form dated 02/25/10.  Marland Kitchen BREAST SURGERY  03/25/2010   left lumpectomy  . DEEP AXILLARY SENTINEL NODE BIOPSY / EXCISION  03/25/2010   4/4 nodes negative  . MEDIASTINOSCOPY N/A 12/27/2013   Procedure: MEDIASTINOSCOPY;  Surgeon: Melrose Nakayama, MD;  Location: [place];  Service: Thoracic;  Laterality: N/A;  . PORT-A-CATH REMOVAL  06/05/2011   Procedure: MINOR REMOVAL PORT-A-CATH;  Surgeon: Adin Hector, MD;  Location: [place];  Service: General;  Laterality: N/A;  right  . PORTACATH PLACEMENT  04/18/2010   Dr. Fanny Skates  . VIDEO BRONCHOSCOPY WITH ENDOBRONCHIAL ULTRASOUND N/A 12/27/2013   Procedure: VIDEO BRONCHOSCOPY WITH ENDOBRONCHIAL ULTRASOUND;  Surgeon: Melrose Nakayama, MD;  Location: [place];  Service: Thoracic;  Laterality: N/A;    Prior to Admission medications   Medication Sig Start Date End Date Taking? Authorizing Provider  ALPRAZolam (XANAX) 0.25 MG tablet Take 0.25 mg by mouth at bedtime as needed.      [provider]  aspirin EC 81 MG tablet Take 81 mg by mouth daily.    [provider]  ATORVASTATIN CALCIUM PO Take 20 mg by mouth daily.     [provider]  benzonatate (TESSALON) 100 MG capsule Take 1-2 capsules (100-200 mg total) by mouth 3 (three) times daily as needed for cough (with full glass of water every 8 hours). 07/23/17   Shella Maxim, NP  clobetasol cream (TEMOVATE) 0.05 % APPLY TO AFFECTED AREAS OF THE  SKIN ON CHEST TWICE DAILY FOR 2 WEEKS ON, THEN 2 WEEKS OFF 06/03/17   [provider]  desvenlafaxine (PRISTIQ) 100 MG 24 hr tablet Take 100 mg by mouth daily.    [provider]  fluticasone (FLONASE) 50 MCG/ACT nasal spray Place 2 sprays into both nostrils daily as needed.    [provider]  hydrochlorothiazide (HYDRODIURIL) 12.5 MG tablet Take 12.5 mg by mouth daily.    [provider]  levothyroxine (SYNTHROID, LEVOTHROID) 100 MCG tablet Take 100  mcg by mouth daily before breakfast.    [provider]  metFORMIN (GLUCOPHAGE) 500 MG tablet Take 500 mg by mouth 2 (two) times daily with a meal.     [provider]  oseltamivir (TAMIFLU) 75 MG capsule Take 1 capsule (75 mg total) by mouth 2 (two) times daily for 5 days. 07/23/17 07/28/17  Shella Maxim, NP  pantoprazole (PROTONIX) 40 MG tablet TAKE 1 TABLET BY MOUTH ONCE DAILY 04/23/15   Margarita Rana, MD  TRUE METRIX BLOOD GLUCOSE TEST test strip  09/10/15   [provider]  TRUEPLUS LANCETS 30G MISC  09/10/15   [provider]  zolpidem (AMBIEN) 5 MG tablet Take 5 mg by mouth at bedtime as needed. 03/17/17   [provider]    Allergies Codeine  Family History  Problem Relation Age of Onset  . Cancer Mother        Cancer of bladder and brain per medical history form dated 02/25/10.  Marland Kitchen Heart disease Father   . COPD Father   . Aortic stenosis Father     Social History Social History   Tobacco Use  . Smoking status: Former Smoker    Packs/day: 0.10    Years: 0.50    Pack years: 0.05    Types: Cigarettes    Last attempt to quit: 04/20/1977    Years since quitting: 40.2  . Smokeless tobacco: Never Used  Substance Use Topics  . Alcohol use: No  . Drug use: No    Review of Systems Constitutional: No fever/chills Eyes: No visual changes. ENT: No sore throat. Cardiovascular: Denies chest pain. Respiratory: Denies shortness of breath. Gastrointestinal: Positive for abdominal pain nausea vomiting. Genitourinary: Negative for dysuria. Musculoskeletal: Negative for neck pain.  Negative for back pain. Integumentary: Negative for rash. Neurological: Negative for headaches, focal weakness or numbness.  ____________________________________________   PHYSICAL EXAM:  VITAL SIGNS: ED Triage Vitals  Enc Vitals Group     BP 07/27/17 0556 (!) 157/85     Pulse Rate 07/27/17 0556 (!) 59     Resp 07/27/17 0556 20     Temp --      Temp  Source 07/27/17 0556 Oral     SpO2 07/27/17 0556 100 %     Weight 07/27/17 0555 81.6 kg (180 lb)     Height 07/27/17 0555 1.6 m (5\' 3" )     Head Circumference --      Peak Flow --      Pain Score 07/27/17 0555 10     Pain Loc --      Pain Edu? --      Excl. in [place]? --     Constitutional: Alert and oriented.  Apparent discomfort  eyes: Conjunctivae are normal. Head: Atraumatic. Mouth/Throat: Mucous membranes are moist.  Oropharynx non-erythematous. Neck: No stridor.   Cardiovascular: Normal rate, regular rhythm. Good peripheral circulation. Grossly normal heart sounds. Respiratory: Normal respiratory effort.  No retractions. Lungs CTAB. Gastrointestinal: Soft and  nontender. No distention.  Musculoskeletal: No lower extremity tenderness nor edema. No gross deformities of extremities. Neurologic:  Normal speech and language. No gross focal neurologic deficits are appreciated.  Skin:  Skin is warm, dry and intact. No rash noted. Psychiatric: Mood and affect are normal. Speech and behavior are normal.  ____________________________________________   LABS (all labs ordered are listed, but only abnormal results are displayed)  Labs Reviewed  GLUCOSE, CAPILLARY - Abnormal; Notable for the following components:      Result Value   Glucose-Capillary 206 (*)    All other components within normal limits  CBC WITH DIFFERENTIAL/PLATELET  COMPREHENSIVE METABOLIC PANEL  LIPASE, BLOOD   _________________________________  RADIOLOGY I, [place] Casilda Pickerill, personally viewed and evaluated these images (plain radiographs) as part of my medical decision making, as well as reviewing the written report by the radiologist.  ED MD interpretation: 4 mm right UVJ stone is obstructing.  Official radiology report(s): Ct Renal Stone Study  Result Date: 07/27/2017 CLINICAL DATA:  Flank pain.  Nausea. EXAM: CT ABDOMEN AND PELVIS WITHOUT CONTRAST TECHNIQUE: Multidetector CT imaging of the abdomen and pelvis  was performed following the standard protocol without IV contrast. COMPARISON:  Chest CT 11/09/2016 FINDINGS: Lower chest: Multiple pulmonary nodules at the lung bases, largest in the right middle lobe measures 8 x 8 mm image 4 series 4. At least 3 additional nodules are seen in the right middle lobe. Small perifissural nodule in the left lower lobe. Suspected right infrahilar adenopathy measuring 14 mm short axis. Findings are stable from 11/09/2016 chest CT. There are coronary artery calcifications. Hepatobiliary: Liver is enlarged spanning 20 cm in craniocaudal dimension. Decreased hepatic density consistent with steatosis with focal fatty sparing adjacent gallbladder fossa. Gallbladder physiologically distended, no calcified stone. No biliary dilatation. Pancreas: No ductal dilatation or inflammation. Spleen: Upper normal in size spanning 13 cm cranial caudal. Adrenals/Urinary Tract: No adrenal nodule. Obstructing 4 mm stone at the right ureterovesicular junction with mild hydroureteronephrosis and perinephric edema. No left hydronephrosis. No additional nonobstructing stone in either kidney. Urinary bladder is partially distended without wall thickening. Stomach/Bowel: Stomach physiologically distended. No bowel wall thickening, obstruction or inflammatory change. Colonic diverticulosis, prominent in the sigmoid colon without diverticulitis. Appendix not visualized, no pericecal or right lower quadrant inflammation. Vascular/Lymphatic: Mild aortic atherosclerosis without aneurysm. No enlarged abdominal or pelvic lymph nodes. Reproductive: Status post hysterectomy. No adnexal masses. Other: Small fat containing umbilical hernia. No free air, free fluid, or intra-abdominal fluid collection. Musculoskeletal: There are no acute or suspicious osseous abnormalities. IMPRESSION: 1. Obstructing 4 mm stone at the right ureterovesicular junction with mild hydronephrosis. 2. Incidental findings of colonic diverticulosis  without diverticulitis, hepatic steatosis with mild hepatomegaly, and Aortic Atherosclerosis (ICD10-I70.0). 3. Chronic lung base findings consistent with history of sarcoidosis. Electronically Signed   By: Jeb Levering M.D.   On: 07/27/2017 06:47    ___________________________________________ Procedures   ____________________________________________   INITIAL IMPRESSION / ASSESSMENT AND PLAN / ED COURSE  As part of my medical decision making, I reviewed the following data within the electronic MEDICAL RECORD NUMBER  62 year old female present with history and physical exam concerning for possible ureterolithiasis versus pyelonephritis versus appendicitis.  As such CT renal protocol was performed which revealed a 4 mm right UVJ stone.  Patient given IV morphine and subsequently IV Toradol as well as 1 L IV normal saline with complete resolution of pain while in the emergency department.  Patient be referred to urology for outpatient follow-up.  ____________________________________________  FINAL CLINICAL IMPRESSION(S) / ED DIAGNOSES  Final diagnoses:  Right kidney stone     MEDICATIONS GIVEN DURING THIS VISIT:  Medications  ketorolac (TORADOL) 30 MG/ML injection (has no administration in time range)  ketorolac (TORADOL) 30 MG/ML injection 30 mg (has no administration in time range)  ondansetron (ZOFRAN) 4 MG/2ML injection (4 mg  Given 07/27/17 0622)  morphine 4 MG/ML injection (4 mg  Given 07/27/17 3735)     ED Discharge Orders    None       Note:  This document was prepared using Dragon voice recognition software and may include unintentional dictation errors.    Gregor Hams, MD 07/27/17 2226

## 2017-07-28 LAB — URINE CULTURE

## 2017-08-12 DIAGNOSIS — N23 Unspecified renal colic: Secondary | ICD-10-CM | POA: Diagnosis not present

## 2017-08-12 DIAGNOSIS — N201 Calculus of ureter: Secondary | ICD-10-CM | POA: Diagnosis not present

## 2017-08-13 ENCOUNTER — Telehealth: Payer: Self-pay | Admitting: Internal Medicine

## 2017-08-13 DIAGNOSIS — R0609 Other forms of dyspnea: Principal | ICD-10-CM

## 2017-08-13 DIAGNOSIS — R0600 Dyspnea, unspecified: Secondary | ICD-10-CM

## 2017-08-13 NOTE — Telephone Encounter (Signed)
Spoke with pt, advised her I would route message to MR to review. Please advise  CLINICAL DATA:  Flank pain.  Nausea.  EXAM: CT ABDOMEN AND PELVIS WITHOUT CONTRAST  TECHNIQUE: Multidetector CT imaging of the abdomen and pelvis was performed following the standard protocol without IV contrast.  COMPARISON:  Chest CT 11/09/2016  FINDINGS: Lower chest: Multiple pulmonary nodules at the lung bases, largest in the right middle lobe measures 8 x 8 mm image 4 series 4. At least 3 additional nodules are seen in the right middle lobe. Small perifissural nodule in the left lower lobe. Suspected right infrahilar adenopathy measuring 14 mm short axis. Findings are stable from 11/09/2016 chest CT. There are coronary artery calcifications.  Hepatobiliary: Liver is enlarged spanning 20 cm in craniocaudal dimension. Decreased hepatic density consistent with steatosis with focal fatty sparing adjacent gallbladder fossa. Gallbladder physiologically distended, no calcified stone. No biliary dilatation.  Pancreas: No ductal dilatation or inflammation.  Spleen: Upper normal in size spanning 13 cm cranial caudal.  Adrenals/Urinary Tract: No adrenal nodule. Obstructing 4 mm stone at the right ureterovesicular junction with mild hydroureteronephrosis and perinephric edema. No left hydronephrosis. No additional nonobstructing stone in either kidney. Urinary bladder is partially distended without wall thickening.  Stomach/Bowel: Stomach physiologically distended. No bowel wall thickening, obstruction or inflammatory change. Colonic diverticulosis, prominent in the sigmoid colon without diverticulitis. Appendix not visualized, no pericecal or right lower quadrant inflammation.  Vascular/Lymphatic: Mild aortic atherosclerosis without aneurysm. No enlarged abdominal or pelvic lymph nodes.  Reproductive: Status post hysterectomy. No adnexal masses.  Other: Small fat containing  umbilical hernia. No free air, free fluid, or intra-abdominal fluid collection.  Musculoskeletal: There are no acute or suspicious osseous abnormalities.  IMPRESSION: 1. Obstructing 4 mm stone at the right ureterovesicular junction with mild hydronephrosis. 2. Incidental findings of colonic diverticulosis without diverticulitis, hepatic steatosis with mild hepatomegaly, and Aortic Atherosclerosis (ICD10-I70.0). 3. Chronic lung base findings consistent with history of sarcoidosis.   Electronically Signed   By: Fonnie Birkenhead.D.

## 2017-08-16 NOTE — Telephone Encounter (Signed)
I personally visualized the renal CT from April 2019 and compared to lung base July 2018. I personlly do NOT see a difference. Anyways, she is supposed to come and see me in the summer with PFT and CT - 1 y ear followup. We will get clarity then

## 2017-08-16 NOTE — Telephone Encounter (Signed)
Called patient unable to reach left message to give us a call back.

## 2017-08-18 NOTE — Telephone Encounter (Signed)
Patient returned call, CB is [phone]

## 2017-08-18 NOTE — Telephone Encounter (Signed)
Attempted to call the pt. I did not receive an answer. I have left a message for pt to return our call.  

## 2017-08-18 NOTE — Telephone Encounter (Signed)
Spoke with pt, aware of recs.  Pt wishes to keep 5/16 OV as scheduled and have PFT prior.  pft scheduled.  Nothing further needed.

## 2017-08-20 NOTE — Addendum Note (Signed)
Addended by: Clayborne Dana C on: 08/20/2017 04:05 PM   Modules accepted: Orders

## 2017-08-23 ENCOUNTER — Ambulatory Visit (INDEPENDENT_AMBULATORY_CARE_PROVIDER_SITE_OTHER): Payer: 59 | Admitting: Internal Medicine

## 2017-08-23 DIAGNOSIS — R0609 Other forms of dyspnea: Secondary | ICD-10-CM | POA: Diagnosis not present

## 2017-08-23 DIAGNOSIS — R0600 Dyspnea, unspecified: Secondary | ICD-10-CM

## 2017-08-23 LAB — PULMONARY FUNCTION TEST
DL/VA % pred: 116 %
DL/VA: 5.47 ml/min/mmHg/L
DLCO UNC % PRED: 86 %
DLCO unc: 19.91 ml/min/mmHg
FEF 25-75 Post: 2.75 L/sec
FEF 25-75 Pre: 3.23 L/sec
FEF2575-%Change-Post: -14 %
FEF2575-%PRED-POST: 119 %
FEF2575-%Pred-Pre: 139 %
FEV1-%CHANGE-POST: -1 %
FEV1-%Pred-Post: 86 %
FEV1-%Pred-Pre: 87 %
FEV1-Post: 2.13 L
FEV1-Pre: 2.17 L
FEV1FVC-%Change-Post: 2 %
FEV1FVC-%Pred-Pre: 114 %
FEV6-%Change-Post: -4 %
FEV6-%PRED-PRE: 78 %
FEV6-%Pred-Post: 75 %
FEV6-POST: 2.32 L
FEV6-PRE: 2.43 L
FEV6FVC-%PRED-POST: 103 %
FEV6FVC-%Pred-Pre: 103 %
FVC-%Change-Post: -4 %
FVC-%PRED-POST: 72 %
FVC-%PRED-PRE: 75 %
FVC-POST: 2.32 L
FVC-PRE: 2.43 L
PRE FEV6/FVC RATIO: 100 %
Post FEV1/FVC ratio: 92 %
Post FEV6/FVC ratio: 100 %
Pre FEV1/FVC ratio: 89 %

## 2017-08-23 NOTE — Progress Notes (Signed)
PFT completed today.  

## 2017-08-26 ENCOUNTER — Telehealth: Payer: Self-pay | Admitting: Internal Medicine

## 2017-08-26 DIAGNOSIS — Z6833 Body mass index (BMI) 33.0-33.9, adult: Secondary | ICD-10-CM | POA: Diagnosis not present

## 2017-08-26 DIAGNOSIS — R350 Frequency of micturition: Secondary | ICD-10-CM | POA: Diagnosis not present

## 2017-08-26 DIAGNOSIS — J329 Chronic sinusitis, unspecified: Secondary | ICD-10-CM | POA: Diagnosis not present

## 2017-08-26 DIAGNOSIS — R05 Cough: Secondary | ICD-10-CM | POA: Diagnosis not present

## 2017-08-26 DIAGNOSIS — N390 Urinary tract infection, site not specified: Secondary | ICD-10-CM | POA: Diagnosis not present

## 2017-08-26 NOTE — Telephone Encounter (Signed)
Received handicap application from pt. Our part was filled out and signed by MR.  Called pt letting her know this was done and that I was mailing it to her home address.  Pt expressed understanding. Nothing further needed at this time.

## 2017-09-02 ENCOUNTER — Ambulatory Visit (INDEPENDENT_AMBULATORY_CARE_PROVIDER_SITE_OTHER): Payer: 59 | Admitting: Internal Medicine

## 2017-09-02 ENCOUNTER — Encounter: Payer: Self-pay | Admitting: Internal Medicine

## 2017-09-02 VITALS — BP 120/78 | HR 73 | Ht 63.0 in | Wt 190.4 lb

## 2017-09-02 DIAGNOSIS — D860 Sarcoidosis of lung: Secondary | ICD-10-CM | POA: Diagnosis not present

## 2017-09-02 DIAGNOSIS — R053 Chronic cough: Secondary | ICD-10-CM

## 2017-09-02 DIAGNOSIS — R05 Cough: Secondary | ICD-10-CM | POA: Diagnosis not present

## 2017-09-02 LAB — NITRIC OXIDE: Nitric Oxide: 13

## 2017-09-02 NOTE — Progress Notes (Signed)
Subjective:     Patient ID: Tracy Barnes, female   DOB: April 05, 1956, 62 y.o.   MRN: [id number]  HPI   OV 06/25/2015  Chief Complaint  Patient presents with  . Follow-up    Pt states her breathing has not changed since last OV in 02/2014. Pt states she is aware of a couple spots on her lungs and would like the spots re-evaluated. Pt c/o chest tightness when SOB - resolves with rest. Pt denies cough.    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 She now presents for dyspnea and follow-up of the mediastinal nodes. Last seen in the fall of 2015. Since then she says she has seen cardiology and his been cleared but dyspnea persists. She has not been to pulmonary rehabilitation and seems interested. In terms of her breast cancer she is now on the [facility] clinic. In terms of pulmonary nodules she had a follow-up CT chest May 2016 and it was stable. 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 This was also stable on the noncontrast CT chest in May 2016. However she says she wants follow-up CT chest because of persistent dyspnea and  granuloma. Recently in November 2016 and her brother got diagnosed at onset/outset with stage IV lung cancer. He quit smoking 25 years ago. Therefore she is extremely worried given her previous smoking history and medical problems.  Today walking desaturation test 185 feet 3 laps on room air: stayed 100% all 3 laps    OV 07/23/2015  Chief Complaint  Patient presents with  . Follow-up    Pt states her breathing is unchanged since last OV. Pt denies any complaints.    Due to hae fu CT in May 2017 foir persistent dyspnea, crackles on exam and prior stage 1 sarcoid (bx proven sept 2015) but mistakenly asked to come 07/23/2015. She paid her co-pay. She has questions on sarcoid and extension and natural hx. She is having some new non tender small nodules o hand and wrist. PCP asked her to check with me   OV 08/29/2015  Chief Complaint   Patient presents with  . Follow-up    Pt here after CT chest. Pt states her breathing is unchanged. Pt denies any new complaints.    This no charge visit because she came for follow-up in April 2017 prematurely due to mistaken scheduling.  Follow-up dyspnea : Normal cardiac stress test in 2015 and and also pulmonary stress test showing chronotropic incompetence in November 2015. This in the setting of obesity. In early stage sarcoidosis of the lung system. She continues to be delayed by dyspnea. It is present on exertion relieved by rest. She is going to start pulmonary rehabilitation. She prefers to go to pulmonary rehabilitation and at the end if there is still dyspnea then to repeat pulmonary stress testing.  Follow-up sarcoidosis: She was really worried that she has extensive cancer. Therefore we did CT scan of the chest which is documented below. Mediastinal adenopathy stable. There is possible progression and pulmonary interstitial infiltrates of sarcoidosis versus stability. This could account for worsening dyspnea would be not sure at this point. She prefers to go to pulmonary rehabilitation.  IMPRESSION: CT chest high resolution 1. Patchy perilymphatic distribution nodularity throughout both lungs and fine peribronchovascular interstitial beading, mildly increased since 09/03/2014, most consistent with slight progression of pulmonary sarcoidosis. 2. Stable moderate mediastinal and bilateral hilar lymphadenopathy, consistent with sarcoidosis. 3. Mild air trapping, indicating small airways disease. 4. Two vessel  coronary atherosclerosis. 5. Diffuse hepatic steatosis.   Electronically Signed  By: Ilona Sorrel M.D.  On: 08/20/2015 09:38          IMPRESSION: CT chest with contrast 1. Persistent mediastinal, hilar, and infrahilar adenopathy, previously hypermetabolic on the 60/45/4098, previous biopsy from 12/27/2013 demonstrated non caseating granulomatous  inflammation consistent with sarcoidosis. From a purely imaging standpoint, lymphoma could cause a similar appearance, although biopsy results were not supportive of lymphoma. 2. Stable bilateral pulmonary nodules, the largest measuring 1.0 by 0.8 cm. Looking back in 2011 this nodule is of a similar size or very minimally smaller on that exam, and accordingly seems unlikely to represent active malignancy. Given the nodal findings, these nodules are most likely a manifestation of sarcoidosis although surveillance imaging may be warranted given the history of malignancy. 3. Hepatic steatosis. 4. Mild cardiomegaly.   Electronically Signed  By: Van Clines M.D.  On: 08/21/2015 09:14     OV 12/30/2015  Chief Complaint  Patient presents with  . Follow-up    Pt here after PFT. Pt states her breathing is slightly improved. Pt c/o sinus congestion and PND causing a nonprod cough. Pt denies CP/tightness and f/c/s .    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 Last seen in May 2017. She was having dyspnea. She opted to pulmonary rehabilitation. This has helped. She's now following up with a Pulm function test that shows a mild decline in FVC and FEV1. However she's had cough and cold for the last 2 days. She does not want antibiotics because she does not have fever or green mucus. In the interim Skin bx of left superior medial chest by  DR Rolm Bookbinder on 6/16/`17 - non caseating granuloma c/w sarcoid. She is on topical steroids guided by dermatology. She wants to continue surveillance for her sarcoidosis with [place]. She is requesting a CT scan in a year.or so   OV 09/02/17 Chief Complaint  Patient presents with  . Follow-up    Last seen 12/30/15.  Pt states she has been doing well since last visit. Pt had the flu 07/22/17 and since the flu, she has had upper respiratory symptoms or a cough with clear phlegm.    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  Pulmonary sarcoidosis in remission as of September 2017.  Has mild basal dyspnea.  September 2017 did have mild lung function decline with FVC which is unclear due to weight or natural decline versus respiratory symptoms at the time of function testing  -I personally have not seen her in nearly 18 months or over 18 months.  She tells me that she was doing really well.  Last summer July 2018 she did have a CT scan of the chest that shows sarcoidosis under remission.  Then in April 2019 she picked up the flu and after that has had a cough.  In between course was complicated by renal stone.  This cough is persisted and lingered.  It was getting better than approximately week ago she did have some sputum that was green in color and currently she is on another course of antibiotics by her primary care.  Cough is rated as moderate and currently dry with a laryngeal quality.  There is no wheezing.  Her mild exertional shortness of breath being due to obesity and associated diastolic dysfunction is stable.  Review of the chart shows that she had a normal cardiac stress test in June 2015.  Exam nitric oxide today  in our office is 13 ppb and normal.Lung function is stable since sept 1017 but does have the mild decline since aug 2015   IMPRESSION: 1. Stable mediastinal and bi hilar lymphadenopathy consistent with the clinical history of sarcoidosis. 2. Right middle lobe pulmonary nodule remains stable consistent with benign granulomatous disease. 3. Coronary artery calcifications. Please note that although the presence of coronary artery calcium documents the presence of coronary artery disease, the severity of this disease and any potential stenosis cannot be assessed on this non-gated CT examination. Assessment for potential risk factor modification, dietary therapy or pharmacologic therapy may be warranted, if clinically indicated.   Electronically  Signed   By: Jacqulynn Cadet M.D.   On: 11/09/2016 18:19   Results for ERETRIA, MANTERNACH (MRN [id number]) as of 09/02/2017 10:29  Ref. Range 12/07/2013 10:12 12/30/2015 09:43 08/23/2017 15:45  FVC-Pre Latest Units: L 2.64 2.42 2.43  FVC-%Pred-Pre Latest Units: % 80 74 75   Results for MARNEE, SHERRARD (MRN [id number]) as of 09/02/2017 10:29  Ref. Range 12/07/2013 10:12 12/30/2015 09:43 08/23/2017 15:45  DLCO unc Latest Units: ml/min/mmHg 20.01 21.33 19.91  DLCO unc % pred Latest Units: % 87 93 86      has a past medical history of Cancer ([place]) (02/19/2010), Chest pain, Diabetes mellitus, Dyspnea on exertion, GERD (gastroesophageal reflux disease), radiation therapy (06/18/10 to 08/01/10), Hyperlipidemia, Hypertension, and Thyroid disease.   reports that she quit smoking about 40 years ago. Her smoking use included cigarettes. She has a 0.05 pack-year smoking history. She has never used smokeless tobacco.  Past Surgical History:  Procedure Laterality Date  . ABDOMINAL HYSTERECTOMY     At 62 yrs of age. One ovary removed. Surgery due to fibroids per medical history form dated 02/25/10.  Marland Kitchen BREAST SURGERY  03/25/2010   left lumpectomy  . DEEP AXILLARY SENTINEL NODE BIOPSY / EXCISION  03/25/2010   4/4 nodes negative  . MEDIASTINOSCOPY N/A 12/27/2013   Procedure: MEDIASTINOSCOPY;  Surgeon: Melrose Nakayama, MD;  Location: [facility];  Service: Thoracic;  Laterality: N/A;  . PORT-A-CATH REMOVAL  06/05/2011   Procedure: MINOR REMOVAL PORT-A-CATH;  Surgeon: Adin Hector, MD;  Location: [facility];  Service: General;  Laterality: N/A;  right  . PORTACATH PLACEMENT  04/18/2010   Dr. Fanny Skates  . VIDEO BRONCHOSCOPY WITH ENDOBRONCHIAL ULTRASOUND N/A 12/27/2013   Procedure: VIDEO BRONCHOSCOPY WITH ENDOBRONCHIAL ULTRASOUND;  Surgeon: Melrose Nakayama, MD;  Location: [facility];  Service: Thoracic;  Laterality: N/A;    No Active Allergies  Immunization History  Administered Date(s)  Administered  . Influenza Split 01/18/2013, 01/08/2014  . Influenza,inj,Quad PF,6+ Mos 01/19/2015, 12/23/2015, 01/25/2017  . Td 04/20/2001  . Tdap 04/05/2016    Family History  Problem Relation Age of Onset  . Cancer Mother        Cancer of bladder and brain per medical history form dated 02/25/10.  Marland Kitchen Heart disease Father   . COPD Father   . Aortic stenosis Father      Current Outpatient Medications:  .  ALPRAZolam (XANAX) 0.25 MG tablet, Take 0.25 mg by mouth at bedtime as needed.  , Disp: , Rfl:  .  aspirin EC 81 MG tablet, Take 81 mg by mouth daily., Disp: , Rfl:  .  ATORVASTATIN CALCIUM PO, Take 20 mg by mouth daily. , Disp: , Rfl:  .  cefdinir (OMNICEF) 300 MG capsule, Take 300 mg by mouth 2 (two) times daily., Disp: , Rfl: 0 .  clobetasol cream (TEMOVATE) 0.05 %, APPLY TO AFFECTED AREAS OF THE SKIN ON CHEST TWICE DAILY FOR 2 WEEKS ON, THEN 2 WEEKS OFF, Disp: , Rfl: 2 .  desvenlafaxine (PRISTIQ) 100 MG 24 hr tablet, Take 100 mg by mouth daily., Disp: , Rfl:  .  fluticasone (FLONASE) 50 MCG/ACT nasal spray, Place 2 sprays into both nostrils daily as needed., Disp: , Rfl:  .  hydrochlorothiazide (HYDRODIURIL) 12.5 MG tablet, Take 12.5 mg by mouth daily., Disp: , Rfl:  .  levothyroxine (SYNTHROID, LEVOTHROID) 100 MCG tablet, Take 100 mcg by mouth daily before breakfast., Disp: , Rfl:  .  metFORMIN (GLUCOPHAGE) 500 MG tablet, Take 500 mg by mouth 2 (two) times daily with a meal. , Disp: , Rfl:  .  pantoprazole (PROTONIX) 40 MG tablet, TAKE 1 TABLET BY MOUTH ONCE DAILY, Disp: 90 tablet, Rfl: 1 .  TRUE METRIX BLOOD GLUCOSE TEST test strip, , Disp: , Rfl: 6 .  TRUEPLUS LANCETS 30G MISC, , Disp: , Rfl: 6 .  zolpidem (AMBIEN) 5 MG tablet, Take 5 mg by mouth at bedtime as needed., Disp: , Rfl: 1 .  ondansetron (ZOFRAN ODT) 4 MG disintegrating tablet, Take 1 tablet (4 mg total) by mouth every 8 (eight) hours as needed for nausea or vomiting. (Patient not taking: Reported on 09/02/2017), Disp:  20 tablet, Rfl: 0   Review of Systems     Objective:   Physical Exam  Constitutional: She is oriented to person, place, and time. She appears well-developed and well-nourished. No distress.  HENT:  Head: Normocephalic and atraumatic.  Right Ear: External ear normal.  Left Ear: External ear normal.  Mouth/Throat: Oropharynx is clear and moist. No oropharyngeal exudate.  Mild occ laryngeal cough  Eyes: Pupils are equal, round, and reactive to light. Conjunctivae and EOM are normal. Right eye exhibits no discharge. Left eye exhibits no discharge. No scleral icterus.  Neck: Normal range of motion. Neck supple. No JVD present. No tracheal deviation present. No thyromegaly present.  Cardiovascular: Normal rate, regular rhythm, normal heart sounds and intact distal pulses. Exam reveals no gallop and no friction rub.  No murmur heard. Pulmonary/Chest: Effort normal and breath sounds normal. No respiratory distress. She has no wheezes. She has no rales. She exhibits no tenderness.  Abdominal: Soft. Bowel sounds are normal. She exhibits no distension and no mass. There is no tenderness. There is no rebound and no guarding.  Musculoskeletal: Normal range of motion. She exhibits no edema or tenderness.  Lymphadenopathy:    She has no cervical adenopathy.  Neurological: She is alert and oriented to person, place, and time. She has normal reflexes. No cranial nerve deficit. She exhibits normal muscle tone. Coordination normal.  Skin: Skin is warm and dry. No rash noted. She is not diaphoretic. No erythema. No pallor.  Psychiatric: She has a normal mood and affect. Her behavior is normal. Judgment and thought content normal.  Vitals reviewed.  Vitals:   09/02/17 1027  BP: 120/78  Pulse: 73  SpO2: 96%  Weight: 190 lb 6.4 oz (86.4 kg)  Height: 5\' 3"  (1.6 m)    Estimated body mass index is 33.73 kg/m as calculated from the following:   Height as of this encounter: 5\' 3"  (1.6 m).   Weight as of  this encounter: 190 lb 6.4 oz (86.4 kg).      Assessment:       ICD-10-CM   1. Chronic cough R05 Nitric oxide    Pulmonary function test  2. Pulmonary sarcoidosis ([place]) D86.0        Plan:      .Cough   - new since April 2019  - no evidence of asthma - likely post viral reactive  -if does not go away in a month call us and we can try empiric prednisone  Sarcoid  - in remission as of July 2018 CT and clinically now - lung function stable 2017  -> 2019 but mild decline since 2015  Plan ROV 1 year with full PFT; return sooner if needed Call in a month if cough still a problem  (> 50% of this 15 min visit spent in face to face counseling or/and coordination of care)   Dr. Brand Males, M.D., [facility].C.P Pulmonary and Critical Care Medicine Staff Physician, [place] Director - Interstitial Lung Disease  Program  Pulmonary [place] at [place], [place], [address]  Pager: [phone], If no answer or between  15:00h - 7:00h: call [phone] Telephone: [phone]

## 2017-09-02 NOTE — Patient Instructions (Addendum)
ICD-10-CM   1. Chronic cough R05   2. Pulmonary sarcoidosis ([place]) D86.0     .Cough   - new since April 2019  - no evidence of asthma - likely post viral reactive  -if does not go away in a month call us and we can try empiric prednisone  Sarcoid  - in remission as of July 2018 CT and clinically now - lung function stable 2017  -> 2019 but mild decline since 2015  Plan ROV 1 year with full PFT; return sooner if needed Call in a month if cough still a problem

## 2017-09-07 MED FILL — ALPRAZolam 0.25 MG TABS: 0.25 | 30 days supply | Qty: 60 | Fill #0

## 2017-09-07 MED FILL — HYDROCHLOROTHIAZIDE 12.5 MG: 12.5 | 90 days supply | Qty: 90 | Fill #0

## 2017-09-11 IMAGING — MG 2D DIGITAL DIAGNOSTIC BILATERAL MAMMOGRAM WITH CAD AND ADJUNCT T
8 of 16 series · 8 of 36 positions shown · non-contrast
Comparison: Previous exam(s).

CLINICAL DATA: Left lumpectomy 7 years ago.  Annual mammography.

EXAM:
2D DIGITAL DIAGNOSTIC BILATERAL MAMMOGRAM WITH CAD AND ADJUNCT TOMO

[L MLO (1 of 2)]
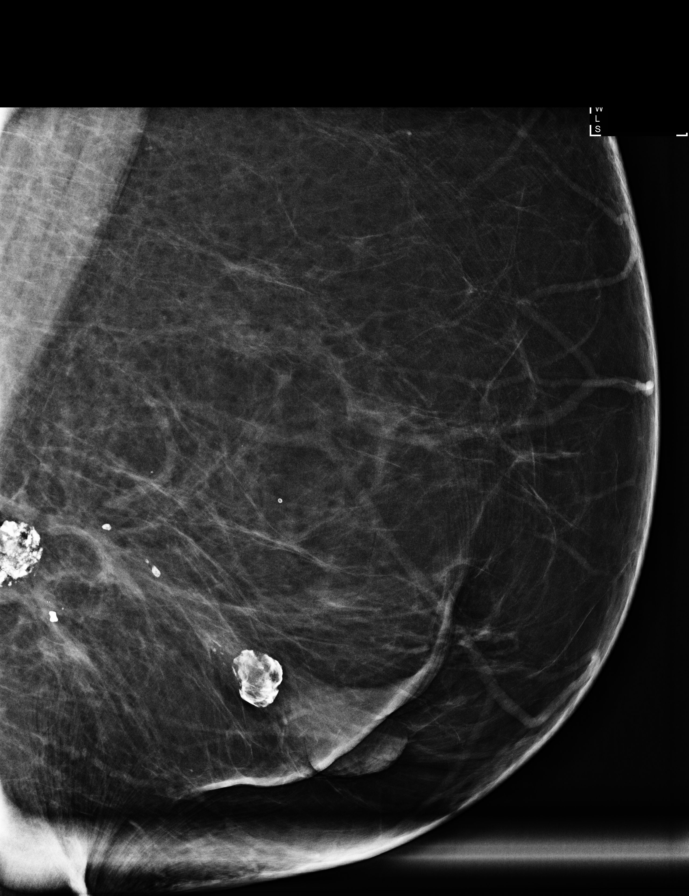

[R CC]
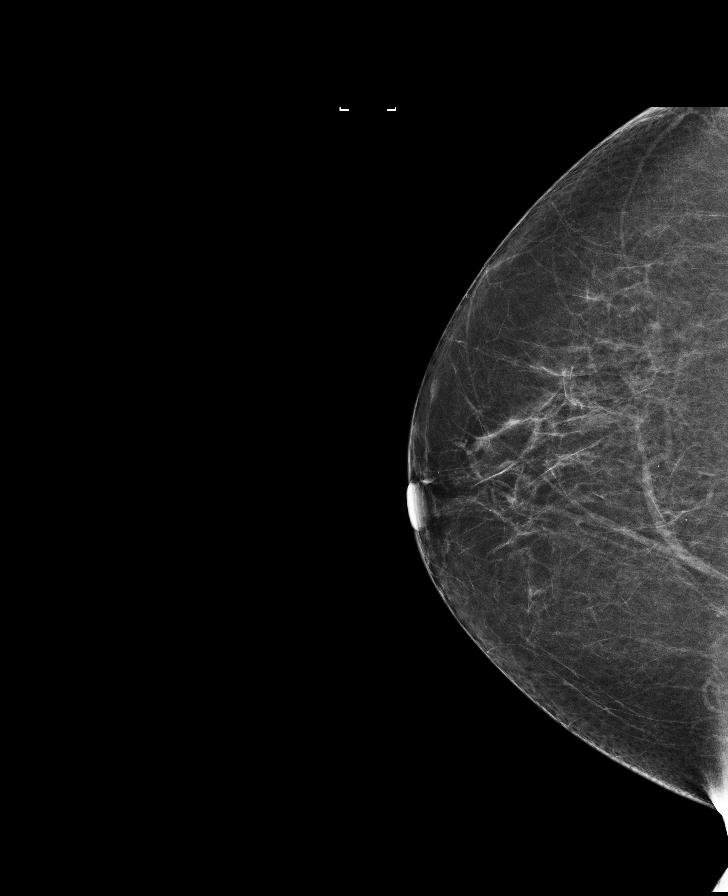

[L MLO (2 of 2)]
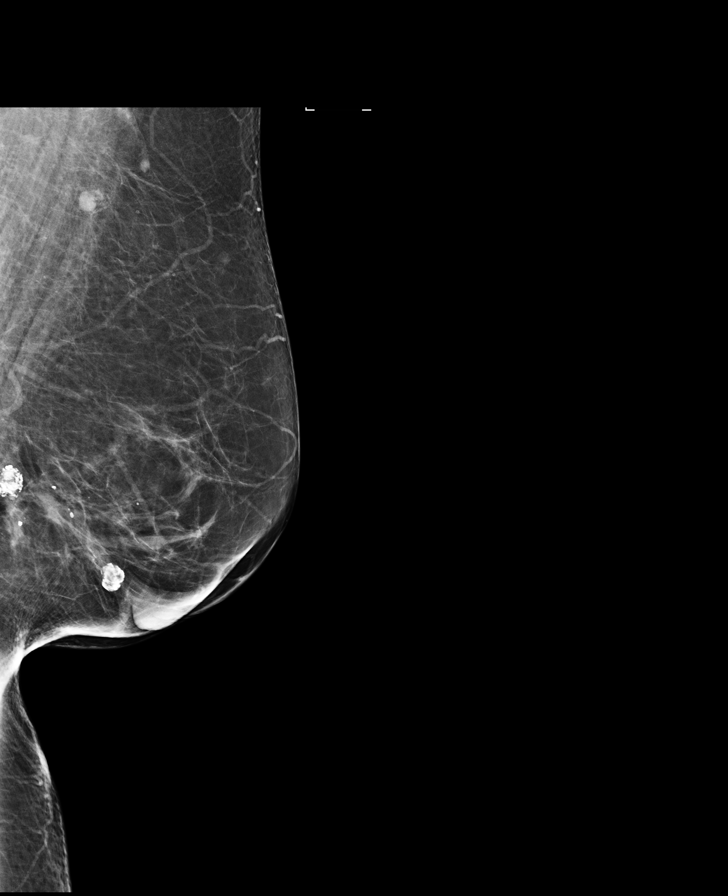

[L MLO synth-2D (1 of 2)]
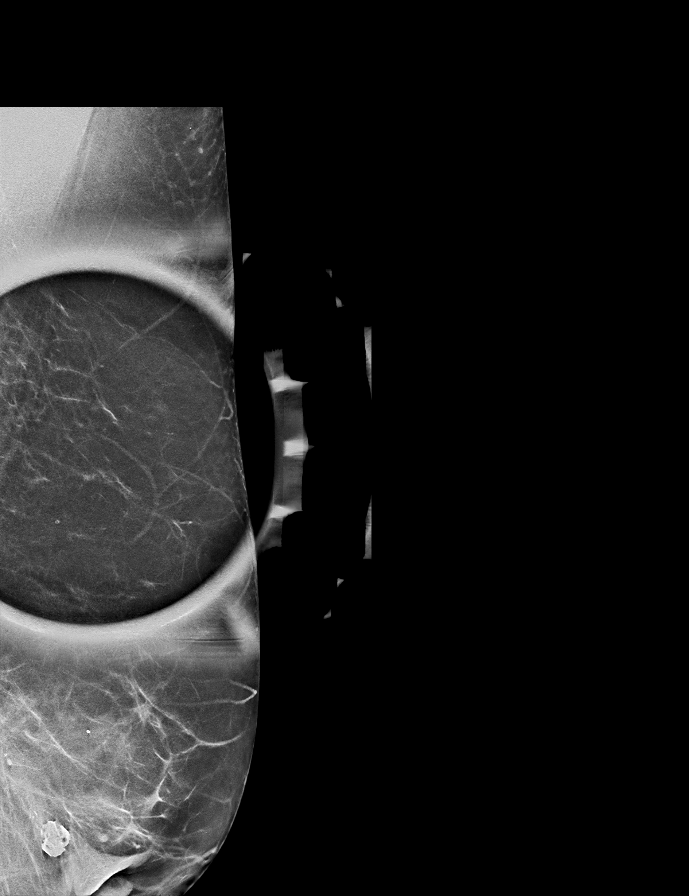

[R CC synth-2D]
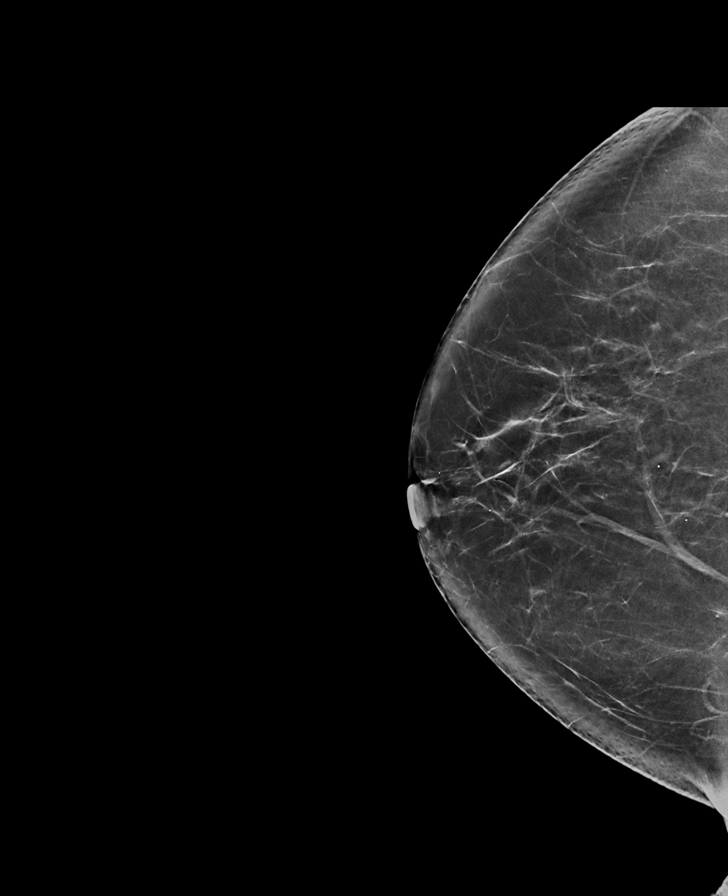

[R MLO]
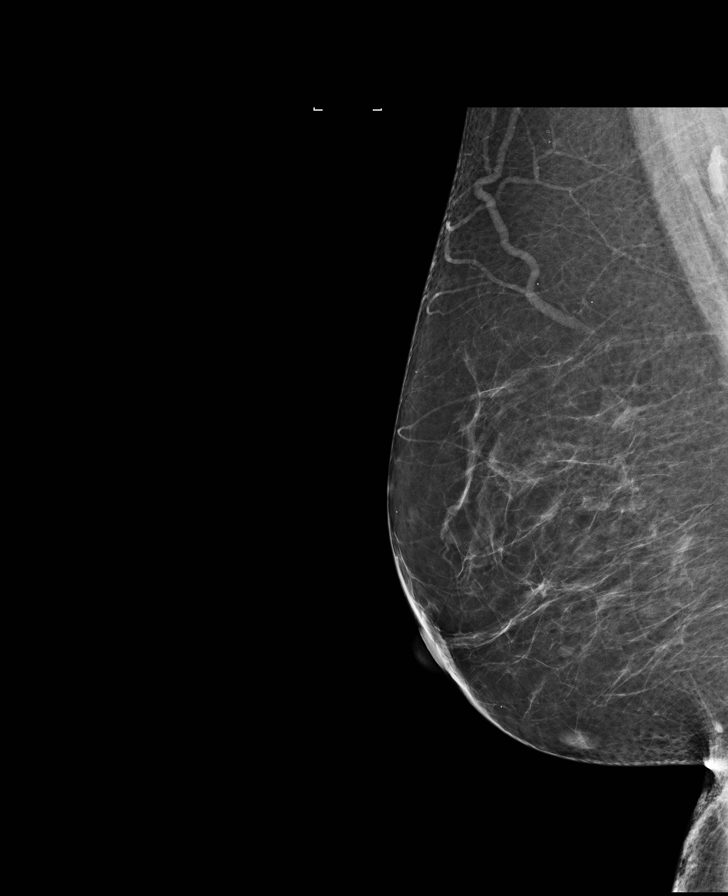

[R MLO synth-2D]
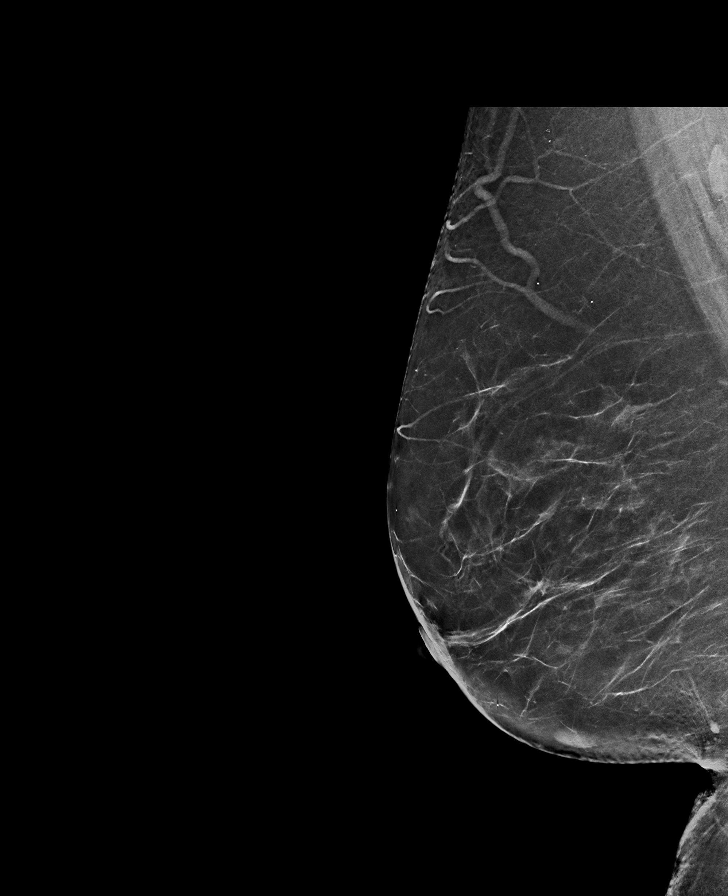

[L MLO synth-2D (2 of 2)]
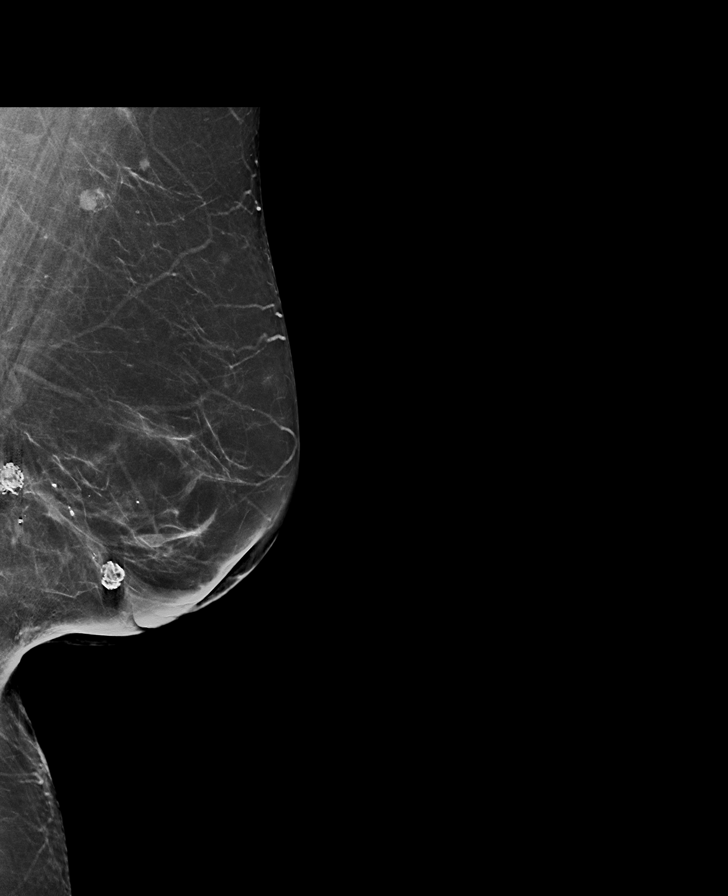

[8 of 36 positions shown; findings below may reference images not displayed]

ACR Breast Density Category b: There are scattered areas of
fibroglandular density.
FINDINGS: The left lumpectomy site is stable. No suspicious masses,
calcifications, or distortion.

Mammographic images were processed with CAD.
IMPRESSION: No mammographic evidence of malignancy.

RECOMMENDATION:
Annual diagnostic mammography.

I have discussed the findings and recommendations with the patient.
Results were also provided in writing at the conclusion of the
visit. If applicable, a reminder letter will be sent to the patient
regarding the next appointment.

BI-RADS CATEGORY  2: Benign.

## 2017-09-20 MED FILL — DESVENLAFAXINE SUC ER 100 M: 100 | 90 days supply | Qty: 90 | Fill #2

## 2017-09-20 MED FILL — ZOLPIDEM TARTRATE 5 MG TAB: 5 | 90 days supply | Qty: 90 | Fill #1

## 2017-09-27 DIAGNOSIS — E038 Other specified hypothyroidism: Secondary | ICD-10-CM | POA: Diagnosis not present

## 2017-09-27 DIAGNOSIS — R82998 Other abnormal findings in urine: Secondary | ICD-10-CM | POA: Diagnosis not present

## 2017-09-27 DIAGNOSIS — Z0000 Encounter for general adult medical examination without abnormal findings: Secondary | ICD-10-CM | POA: Diagnosis not present

## 2017-09-27 DIAGNOSIS — E119 Type 2 diabetes mellitus without complications: Secondary | ICD-10-CM | POA: Diagnosis not present

## 2017-10-07 DIAGNOSIS — E038 Other specified hypothyroidism: Secondary | ICD-10-CM | POA: Diagnosis not present

## 2017-10-07 DIAGNOSIS — D860 Sarcoidosis of lung: Secondary | ICD-10-CM | POA: Diagnosis not present

## 2017-10-07 DIAGNOSIS — K635 Polyp of colon: Secondary | ICD-10-CM | POA: Diagnosis not present

## 2017-10-07 DIAGNOSIS — E119 Type 2 diabetes mellitus without complications: Secondary | ICD-10-CM | POA: Diagnosis not present

## 2017-10-07 DIAGNOSIS — R05 Cough: Secondary | ICD-10-CM | POA: Diagnosis not present

## 2017-10-07 DIAGNOSIS — C50919 Malignant neoplasm of unspecified site of unspecified female breast: Secondary | ICD-10-CM | POA: Diagnosis not present

## 2017-10-07 DIAGNOSIS — E668 Other obesity: Secondary | ICD-10-CM | POA: Diagnosis not present

## 2017-10-07 DIAGNOSIS — K760 Fatty (change of) liver, not elsewhere classified: Secondary | ICD-10-CM | POA: Diagnosis not present

## 2017-10-07 DIAGNOSIS — Z0000 Encounter for general adult medical examination without abnormal findings: Secondary | ICD-10-CM | POA: Diagnosis not present

## 2017-11-08 MED FILL — ALPRAZolam 0.25 MG TABS: 0.25 | 30 days supply | Qty: 60 | Fill #1

## 2017-11-08 MED FILL — LEVOTHYROXINE 100 MCG TABLE: 100 | 90 days supply | Qty: 90 | Fill #2

## 2017-11-08 MED FILL — ATORVASTATIN CALCIUM 20 MG: 20 | 60 days supply | Qty: 60 | Fill #2

## 2017-12-13 DIAGNOSIS — C50912 Malignant neoplasm of unspecified site of left female breast: Secondary | ICD-10-CM | POA: Diagnosis not present

## 2017-12-21 MED FILL — ALPRAZolam 0.25 MG TABS: 0.25 | 30 days supply | Qty: 60 | Fill #0

## 2017-12-21 MED FILL — DESVENLAFAXINE SUC ER 100 M: 100 | 90 days supply | Qty: 90 | Fill #0

## 2017-12-21 MED FILL — HYDROCHLOROTHIAZIDE 12.5 MG: 12.5 | 90 days supply | Qty: 90 | Fill #1

## 2017-12-21 MED FILL — METFORMIN HCL ER 500 MG TAB: 500 | 90 days supply | Qty: 180 | Fill #0

## 2017-12-21 MED FILL — ZOLPIDEM TARTRATE 5 MG TAB: 5 | 90 days supply | Qty: 90 | Fill #0

## 2017-12-27 MED FILL — ATORVASTATIN CALCIUM 20 MG: 20 | 90 days supply | Qty: 90 | Fill #0

## 2018-01-18 MED FILL — ACCU-CHEK GUIDE TEST STRIP: 90 days supply | Qty: 300 | Fill #0

## 2018-01-24 ENCOUNTER — Other Ambulatory Visit: Payer: Self-pay | Admitting: Endocrinology

## 2018-01-24 DIAGNOSIS — Z1231 Encounter for screening mammogram for malignant neoplasm of breast: Secondary | ICD-10-CM

## 2018-03-09 ENCOUNTER — Ambulatory Visit: Payer: 59

## 2018-03-15 ENCOUNTER — Ambulatory Visit
Admission: RE | Admit: 2018-03-15 | Discharge: 2018-03-15 | Disposition: A | Discharge status: Home / Self Care | Payer: 59 | Source: Ambulatory Visit | Attending: Endocrinology | Admitting: Endocrinology

## 2018-03-15 DIAGNOSIS — Z1231 Encounter for screening mammogram for malignant neoplasm of breast: Secondary | ICD-10-CM | POA: Diagnosis not present

## 2018-03-22 MED FILL — metFORMIN HCL ER 500 MG TB2: 500 | 90 days supply | Qty: 180 | Fill #1

## 2018-03-22 MED FILL — LEVOTHYROXINE 100 MCG TABLE: 100 | 90 days supply | Qty: 90 | Fill #0

## 2018-03-22 MED FILL — DESVENLAFAXINE SUC ER 100 M: 100 | 90 days supply | Qty: 90 | Fill #1

## 2018-03-22 MED FILL — HYDROCHLOROTHIAZIDE 12.5 MG: 12.5 | 90 days supply | Qty: 90 | Fill #0

## 2018-03-22 MED FILL — ATORVASTATIN CALCIUM 20 MG: 20 | 90 days supply | Qty: 90 | Fill #1

## 2018-03-22 MED FILL — ZOLPIDEM TARTRATE 5 MG TAB: 5 | 90 days supply | Qty: 90 | Fill #1

## 2018-03-24 ENCOUNTER — Ambulatory Visit (INDEPENDENT_AMBULATORY_CARE_PROVIDER_SITE_OTHER): Payer: Self-pay | Admitting: Physician Assistant

## 2018-03-24 ENCOUNTER — Encounter: Payer: Self-pay | Admitting: Physician Assistant

## 2018-03-24 VITALS — BP 140/90 | HR 73 | Temp 97.5°F | Wt 192.0 lb

## 2018-03-24 DIAGNOSIS — J069 Acute upper respiratory infection, unspecified: Secondary | ICD-10-CM

## 2018-03-24 MED ORDER — BENZONATATE 200 MG PO CAPS: 200.0000 mg | ORAL_CAPSULE | Freq: Two times a day (BID) | ORAL | 0 refills | Status: DC | PRN

## 2018-03-24 MED ORDER — LORATADINE-PSEUDOEPHEDRINE ER 10-240 MG PO TB24: 1.0000 | ORAL_TABLET | Freq: Every day | ORAL | 0 refills | Status: DC

## 2018-03-24 MED ORDER — FLUTICASONE PROPIONATE 50 MCG/ACT NA SUSP: 2.0000 | Freq: Every day | NASAL | 0 refills | Status: DC

## 2018-03-24 NOTE — Progress Notes (Signed)
Patient ID: Tracy Barnes DOB: 01/04/1956 AGE: 62 y.o. MRN: [id number]   PCP: Reynold Bowen, MD   Chief Complaint:  Chief Complaint  Patient presents with  . choice-ear pain  . sinus pressure     Subjective:    HPI:  Tracy Barnes is a 62 y.o. female presents for evaluation  Chief Complaint  Patient presents with  . choice-ear pain  . sinus pressure    62 year old female presents to [facility] with 4 day history of URI symptoms. Began with fever (tmax 100F), subjective warmth, malaise, and sore throat. Describes sore throat as raw/irritated with phlegm in posterior pharynx. Then developed nasal congestion, frontal headache, maxillary sinus pressure/congestoin (equal bilaterally), and bilateral ear fullness/pressure. Associated dry cough. Worse at night. Has taken OTC Advil Cold & Sinus with minimal relief. Denies dizziness/lightheadedness, ear pain, chest pain, SOB, wheezing. Patient's daughter is due to give birth to her first child (patient's first grandson) in 2 weeks. Patient concerned she may have strep, flu, or other illness that is contagious.  Patient with pulmonary sarcoidosis. In remission as of July 2018 CT. Patient clinically stable. Managed by pulmonology, Dr. Cindra Presume MD with [facility] [place]. Last seen 09/02/2017.  Patient is a type 2 diabetic. Non-insulin dependent. Currently controlled on Metformin. Last A1C available in Epic from 02/17/2016 was 6.3%.  A complete, at least 10 system review of symptoms was performed, pertinent positives and negatives as mentioned in HPI, otherwise negative.  The following portions of the patient's history were reviewed and updated as appropriate: allergies, current medications and past medical history.  Patient Active Problem List   Diagnosis Date Noted  . Pulmonary sarcoidosis ([place]) 08/29/2015  . Prediabetes 08/26/2015  . Bibasilar crackles 06/25/2015  .  Gastro-esophageal reflux disease without esophagitis 04/23/2015  . Essential hypertension 03/19/2014  . Hyperlipidemia 03/19/2014  . Lung nodule 01/08/2014  . Dyspnea 01/08/2014  . Mediastinal adenopathy 12/19/2013  . Nodule of right lung 12/10/2013  . Dyspnea on exertion 09/01/2013  . Chest pain 09/01/2013  . Breast cancer, left breast ([place]) 05/01/2013  . Hx of radiation therapy   . Skin moles 09/19/2010  . Wears glasses/contacts 09/19/2010  . 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 Menopause 09/19/2010  . Hormone replacement therapy (postmenopausal) 09/19/2010  . PLANTAR FASCIITIS, BILATERAL 02/05/2009  . ABNORMALITY OF GAIT 02/05/2009    No Active Allergies  Current Outpatient Medications on File Prior to Visit  Medication Sig Dispense Refill  . ALPRAZolam (XANAX) 0.25 MG tablet Take 0.25 mg by mouth at bedtime as needed.      Marland Kitchen aspirin EC 81 MG tablet Take 81 mg by mouth daily.    . ATORVASTATIN CALCIUM PO Take 20 mg by mouth daily.     . clobetasol cream (TEMOVATE) 0.05 % APPLY TO AFFECTED AREAS OF THE SKIN ON CHEST TWICE DAILY FOR 2 WEEKS ON, THEN 2 WEEKS OFF  2  . desvenlafaxine (PRISTIQ) 100 MG 24 hr tablet Take 100 mg by mouth daily.    . fluticasone (FLONASE) 50 MCG/ACT nasal spray Place 2 sprays into both nostrils daily as needed.    . hydrochlorothiazide (HYDRODIURIL) 12.5 MG tablet Take 12.5 mg by mouth daily.    Marland Kitchen levothyroxine (SYNTHROID, LEVOTHROID) 100 MCG tablet Take 100 mcg by mouth daily before breakfast.    . metFORMIN (GLUCOPHAGE) 500 MG tablet Take 500 mg by mouth 2 (two) times daily with a meal.     .  pantoprazole (PROTONIX) 40 MG tablet TAKE 1 TABLET BY MOUTH ONCE DAILY 90 tablet 1  . TRUE METRIX BLOOD GLUCOSE TEST test strip   6  . TRUEPLUS LANCETS 30G MISC   6   No current facility-administered medications on file prior to visit.        Objective:   Vitals:   03/24/18 0810  BP: 140/90  Pulse: 73  Temp: (!) 97.5 F (36.4 C)   SpO2: 96%     Wt Readings from Last 3 Encounters:  03/24/18 192 lb (87.1 kg)  09/02/17 190 lb 6.4 oz (86.4 kg)  07/23/17 183 lb (83 kg)    Physical Exam:   General Appearance:  Smiling, friendly, cooperative, appears stated age. In no acute distress. Afebrile.  Head:  Normocephalic, without obvious abnormality, atraumatic  Eyes:  PERRL, conjunctiva/corneas clear, EOM's intact  Ears:  Normal TM's (no injection or erythema. Mild serous effusion bilaterally) and external ear canals, both ears  Nose: Nares normal, septum midline. Nasal mucosa with minimal edema, clear rhinorrhea. No sinus tenderness with percussion/palpation. Subjective sinus pressure/congestion.  Throat: Lips, mucosa, and tongue normal; teeth and gums normal. Throat reveals no erythema.   Neck: Supple, symmetrical, trachea midline, no lymphadenopathy   Lungs:   Clear to auscultation bilaterally, respirations unlabored. No wheezing, rales, rhonchi, crackles. Good aeration.  Heart:  Regular rate and rhythm, S1 and S2 normal, no murmur, rub, or gallop  Extremities: Extremities normal, atraumatic, no cyanosis or edema  Pulses: 2+ and symmetric  Skin: Skin color, texture, turgor normal, no rashes or lesions  Lymph nodes: Cervical, supraclavicular, and axillary nodes normal  Neurologic: Normal    Assessment & Plan:    Exam findings, diagnosis etiology and medication use and indications reviewed with patient. Follow-Up and discharge instructions provided. No emergent/urgent issues found on exam.  Patient education was provided.   Patient verbalized understanding of information provided and agrees with plan of care (POC), all questions answered. The patient is advised to call or return to clinic if condition does not see an improvement in symptoms, or to seek the care of the closest emergency department if condition worsens with the below plan.    1. Upper respiratory tract infection, unspecified type  - fluticasone  (FLONASE) 50 MCG/ACT nasal spray; Place 2 sprays into both nostrils daily.  Dispense: 16 g; Refill: 0 - loratadine-pseudoephedrine (CLARITIN-D 24 HOUR) 10-240 MG 24 hr tablet; Take 1 tablet by mouth daily.  Dispense: 14 tablet; Refill: 0 - benzonatate (TESSALON) 200 MG capsule; Take 1 capsule (200 mg total) by mouth 2 (two) times daily as needed for cough.  Dispense: 20 capsule; Refill: 0  Patient with four day history of URI symptoms. VSS. In no acute distress. Discussed typical course for self-limited viral URI. Prescribed Flonase, Claritin-D, and Tessalon Perles for symptom management. Advised increase fluids, rest, humidifier in bedroom. Patient concerned about contagiousness with newborn; advised good hand hygiene, coughing in to elbow, and avoiding newborn if still significantly symptomatic. Advised patient check her TDaP status with her PCP, to ensure pertussis protection around newborn. Advised patient return to [facility] or follow-up with her PCP if symptoms not improving within one week, sooner with any worsening symptoms. Patient agreed with plan.   Darlin Priestly, MHS, PA-C Montey Hora, MHS, PA-C Advanced Practice Provider [facility]  [address], [facility], [place], [place] [address] (p):  [phone] [email or web address] [email or web address]

## 2018-03-24 NOTE — Patient Instructions (Signed)
Thank you for choosing [facility] for your health care needs.  You have been diagnosed with an upper respiratory infection (a cold).  Low suspicion for strep throat or influenza.  Recommend increase fluids; water, Gatorade, or hot/warm tea. Use humidifier or vaporizer in bedroom. Rest.  You have been prescribed a steroid nasal spray (Flonase), an antihistamine with a decongestant (Claritin-D), and a cough suppressant (Tessalon Perles).  Return to [facility] in one week if symptoms not improving, sooner with any worsening symptoms.  Hope you feel better soon! Congratulations on your grandson!!!  Upper Respiratory Infection, Adult Most upper respiratory infections (URIs) are caused by a virus. A URI affects the nose, throat, and upper air passages. The most common type of URI is often called "the common cold." Follow these instructions at home:  Take medicines only as told by your doctor.  Gargle warm saltwater or take cough drops to comfort your throat as told by your doctor.  Use a warm mist humidifier or inhale steam from a shower to increase air moisture. This may make it easier to breathe.  Drink enough fluid to keep your pee (urine) clear or pale yellow.  Eat soups and other clear broths.  Have a healthy diet.  Rest as needed.  Go back to work when your fever is gone or your doctor says it is okay. ? You may need to stay home longer to avoid giving your URI to others. ? You can also wear a face mask and wash your hands often to prevent spread of the virus.  Use your inhaler more if you have asthma.  Do not use any tobacco products, including cigarettes, chewing tobacco, or electronic cigarettes. If you need help quitting, ask your doctor. Contact a doctor if:  You are getting worse, not better.  Your symptoms are not helped by medicine.  You have chills.  You are getting more short of breath.  You have brown or red mucus.  You have yellow or brown discharge  from your nose.  You have pain in your face, especially when you bend forward.  You have a fever.  You have puffy (swollen) neck glands.  You have pain while swallowing.  You have white areas in the back of your throat. Get help right away if:  You have very bad or constant: ? Headache. ? Ear pain. ? Pain in your forehead, behind your eyes, and over your cheekbones (sinus pain). ? Chest pain.  You have long-lasting (chronic) lung disease and any of the following: ? Wheezing. ? Long-lasting cough. ? Coughing up blood. ? A change in your usual mucus.  You have a stiff neck.  You have changes in your: ? Vision. ? Hearing. ? Thinking. ? Mood. This information is not intended to replace advice given to you by your health care provider. Make sure you discuss any questions you have with your health care provider. Document Released: 09/23/2007 Document Revised: 12/08/2015 Document Reviewed: 07/12/2013 Elsevier Interactive Patient Education  2018 [employer].

## 2018-03-28 ENCOUNTER — Telehealth: Payer: Self-pay | Admitting: Emergency Medicine

## 2018-03-28 NOTE — Telephone Encounter (Signed)
Spoke with patient follow up call from visit with [facility]. Per patient still hoarse but feeling better.

## 2018-03-29 DIAGNOSIS — E119 Type 2 diabetes mellitus without complications: Secondary | ICD-10-CM | POA: Diagnosis not present

## 2018-04-08 MED FILL — ALPRAZolam 0.25 MG TABS: 0.25 | 30 days supply | Qty: 60 | Fill #1

## 2018-06-03 DIAGNOSIS — D863 Sarcoidosis of skin: Secondary | ICD-10-CM | POA: Diagnosis not present

## 2018-06-03 DIAGNOSIS — L570 Actinic keratosis: Secondary | ICD-10-CM | POA: Diagnosis not present

## 2018-06-17 MED FILL — DESVENLAFAXINE SUC ER 100 M: 100 | 90 days supply | Qty: 90 | Fill #2 | Status: TO

## 2018-06-17 MED FILL — metFORMIN HCL ER 500 MG TB2: 500 | 90 days supply | Qty: 180 | Fill #2 | Status: TO

## 2018-06-17 MED FILL — HYDROCHLOROTHIAZIDE 12.5 MG: 12.5 | 90 days supply | Qty: 90 | Fill #1 | Status: TO

## 2018-06-17 MED FILL — LEVOTHYROXINE 100 MCG TABLE: 100 | 90 days supply | Qty: 90 | Fill #1 | Status: TO

## 2018-06-21 MED FILL — ALPRAZolam 0.25 MG TABS: 0.25 | 30 days supply | Qty: 60 | Fill #0

## 2018-06-21 MED FILL — ATORVASTATIN 20 MG TABLET: 20 | 30 days supply | Qty: 30 | Fill #0 | Status: TO

## 2018-06-21 MED FILL — ZOLPIDEM TARTRATE 5 MG TAB: 5 | 30 days supply | Qty: 30 | Fill #0

## 2018-06-30 DIAGNOSIS — F3289 Other specified depressive episodes: Secondary | ICD-10-CM | POA: Diagnosis not present

## 2018-06-30 DIAGNOSIS — E038 Other specified hypothyroidism: Secondary | ICD-10-CM | POA: Diagnosis not present

## 2018-06-30 DIAGNOSIS — K760 Fatty (change of) liver, not elsewhere classified: Secondary | ICD-10-CM | POA: Diagnosis not present

## 2018-06-30 DIAGNOSIS — D860 Sarcoidosis of lung: Secondary | ICD-10-CM | POA: Diagnosis not present

## 2018-06-30 DIAGNOSIS — I10 Essential (primary) hypertension: Secondary | ICD-10-CM | POA: Diagnosis not present

## 2018-06-30 DIAGNOSIS — E119 Type 2 diabetes mellitus without complications: Secondary | ICD-10-CM | POA: Diagnosis not present

## 2018-06-30 DIAGNOSIS — E7849 Other hyperlipidemia: Secondary | ICD-10-CM | POA: Diagnosis not present

## 2018-06-30 DIAGNOSIS — N200 Calculus of kidney: Secondary | ICD-10-CM | POA: Diagnosis not present

## 2018-06-30 DIAGNOSIS — E668 Other obesity: Secondary | ICD-10-CM | POA: Diagnosis not present

## 2018-06-30 MED FILL — ONDANSETRON ODT 4 MG TABLET: 4 | 15 days supply | Qty: 90 | Fill #0

## 2018-07-05 ENCOUNTER — Other Ambulatory Visit: Payer: Self-pay | Admitting: Gastroenterology

## 2018-07-05 DIAGNOSIS — R1011 Right upper quadrant pain: Secondary | ICD-10-CM | POA: Diagnosis not present

## 2018-07-05 DIAGNOSIS — E669 Obesity, unspecified: Secondary | ICD-10-CM | POA: Diagnosis not present

## 2018-07-05 DIAGNOSIS — K219 Gastro-esophageal reflux disease without esophagitis: Secondary | ICD-10-CM | POA: Diagnosis not present

## 2018-07-05 DIAGNOSIS — K5730 Diverticulosis of large intestine without perforation or abscess without bleeding: Secondary | ICD-10-CM | POA: Diagnosis not present

## 2018-07-05 DIAGNOSIS — Z1211 Encounter for screening for malignant neoplasm of colon: Secondary | ICD-10-CM | POA: Diagnosis not present

## 2018-07-05 DIAGNOSIS — Z86010 Personal history of colonic polyps: Secondary | ICD-10-CM | POA: Diagnosis not present

## 2018-07-14 DIAGNOSIS — E119 Type 2 diabetes mellitus without complications: Secondary | ICD-10-CM | POA: Diagnosis not present

## 2018-07-14 DIAGNOSIS — J209 Acute bronchitis, unspecified: Secondary | ICD-10-CM | POA: Diagnosis not present

## 2018-07-14 DIAGNOSIS — I10 Essential (primary) hypertension: Secondary | ICD-10-CM | POA: Diagnosis not present

## 2018-07-18 ENCOUNTER — Encounter (HOSPITAL_COMMUNITY): Admission: RE | Admit: 2018-07-18 | Payer: 59 | Source: Ambulatory Visit

## 2018-07-18 ENCOUNTER — Ambulatory Visit (HOSPITAL_COMMUNITY): Payer: 59

## 2018-07-18 ENCOUNTER — Encounter (HOSPITAL_COMMUNITY): Payer: Self-pay

## 2018-08-18 ENCOUNTER — Ambulatory Visit
Admission: RE | Admit: 2018-08-18 | Discharge: 2018-08-18 | Disposition: A | Discharge status: Home / Self Care | Payer: 59 | Source: Ambulatory Visit | Attending: Gastroenterology | Admitting: Gastroenterology

## 2018-08-18 ENCOUNTER — Encounter
Admission: RE | Admit: 2018-08-18 | Discharge: 2018-08-18 | Disposition: A | Discharge status: Home / Self Care | Payer: 59 | Source: Ambulatory Visit | Attending: Gastroenterology | Admitting: Gastroenterology

## 2018-08-18 ENCOUNTER — Other Ambulatory Visit: Payer: Self-pay

## 2018-08-18 DIAGNOSIS — Z01419 Encounter for gynecological examination (general) (routine) without abnormal findings: Secondary | ICD-10-CM | POA: Diagnosis not present

## 2018-08-18 DIAGNOSIS — R1011 Right upper quadrant pain: Secondary | ICD-10-CM | POA: Diagnosis not present

## 2018-08-18 DIAGNOSIS — Z6833 Body mass index (BMI) 33.0-33.9, adult: Secondary | ICD-10-CM | POA: Diagnosis not present

## 2018-08-18 DIAGNOSIS — K7689 Other specified diseases of liver: Secondary | ICD-10-CM | POA: Diagnosis not present

## 2018-08-18 MED ORDER — TECHNETIUM TC 99M MEBROFENIN IV KIT
5.0000 | PACK | Freq: Once | INTRAVENOUS | Status: AC | PRN
  Administered 2018-08-18: 5.58 via INTRAVENOUS

## 2018-09-21 DIAGNOSIS — H6692 Otitis media, unspecified, left ear: Secondary | ICD-10-CM | POA: Diagnosis not present

## 2018-09-21 DIAGNOSIS — J0100 Acute maxillary sinusitis, unspecified: Secondary | ICD-10-CM | POA: Diagnosis not present

## 2018-09-21 DIAGNOSIS — E119 Type 2 diabetes mellitus without complications: Secondary | ICD-10-CM | POA: Diagnosis not present

## 2018-10-13 DIAGNOSIS — Z0000 Encounter for general adult medical examination without abnormal findings: Secondary | ICD-10-CM | POA: Diagnosis not present

## 2018-10-13 DIAGNOSIS — E7849 Other hyperlipidemia: Secondary | ICD-10-CM | POA: Diagnosis not present

## 2018-10-13 DIAGNOSIS — E119 Type 2 diabetes mellitus without complications: Secondary | ICD-10-CM | POA: Diagnosis not present

## 2018-10-20 DIAGNOSIS — Z1339 Encounter for screening examination for other mental health and behavioral disorders: Secondary | ICD-10-CM | POA: Diagnosis not present

## 2018-10-20 DIAGNOSIS — Z1331 Encounter for screening for depression: Secondary | ICD-10-CM | POA: Diagnosis not present

## 2018-10-20 DIAGNOSIS — C50919 Malignant neoplasm of unspecified site of unspecified female breast: Secondary | ICD-10-CM | POA: Diagnosis not present

## 2018-10-20 DIAGNOSIS — J984 Other disorders of lung: Secondary | ICD-10-CM | POA: Diagnosis not present

## 2018-10-20 DIAGNOSIS — N200 Calculus of kidney: Secondary | ICD-10-CM | POA: Diagnosis not present

## 2018-10-20 DIAGNOSIS — E119 Type 2 diabetes mellitus without complications: Secondary | ICD-10-CM | POA: Diagnosis not present

## 2018-10-20 DIAGNOSIS — D860 Sarcoidosis of lung: Secondary | ICD-10-CM | POA: Diagnosis not present

## 2018-10-20 DIAGNOSIS — I515 Myocardial degeneration: Secondary | ICD-10-CM | POA: Diagnosis not present

## 2018-10-20 DIAGNOSIS — K760 Fatty (change of) liver, not elsewhere classified: Secondary | ICD-10-CM | POA: Diagnosis not present

## 2018-10-20 DIAGNOSIS — E669 Obesity, unspecified: Secondary | ICD-10-CM | POA: Diagnosis not present

## 2018-10-20 DIAGNOSIS — Z0000 Encounter for general adult medical examination without abnormal findings: Secondary | ICD-10-CM | POA: Diagnosis not present

## 2019-02-14 ENCOUNTER — Other Ambulatory Visit: Payer: Self-pay | Admitting: Endocrinology

## 2019-02-14 DIAGNOSIS — Z1231 Encounter for screening mammogram for malignant neoplasm of breast: Secondary | ICD-10-CM

## 2019-03-22 ENCOUNTER — Ambulatory Visit
Admission: RE | Admit: 2019-03-22 | Discharge: 2019-03-22 | Disposition: A | Discharge status: Home / Self Care | Payer: 59 | Source: Ambulatory Visit | Attending: Endocrinology | Admitting: Endocrinology

## 2019-03-22 DIAGNOSIS — Z1231 Encounter for screening mammogram for malignant neoplasm of breast: Secondary | ICD-10-CM | POA: Diagnosis not present

## 2019-03-23 ENCOUNTER — Other Ambulatory Visit: Payer: Self-pay | Admitting: Endocrinology

## 2019-03-27 ENCOUNTER — Other Ambulatory Visit: Payer: Self-pay | Admitting: Endocrinology

## 2019-03-27 DIAGNOSIS — N6310 Unspecified lump in the right breast, unspecified quadrant: Secondary | ICD-10-CM

## 2019-03-27 DIAGNOSIS — R928 Other abnormal and inconclusive findings on diagnostic imaging of breast: Secondary | ICD-10-CM

## 2019-03-29 ENCOUNTER — Ambulatory Visit
Admission: RE | Admit: 2019-03-29 | Discharge: 2019-03-29 | Disposition: A | Discharge status: Home / Self Care | Payer: 59 | Source: Ambulatory Visit | Attending: Endocrinology | Admitting: Endocrinology

## 2019-03-29 DIAGNOSIS — N6310 Unspecified lump in the right breast, unspecified quadrant: Secondary | ICD-10-CM | POA: Diagnosis not present

## 2019-03-29 DIAGNOSIS — R928 Other abnormal and inconclusive findings on diagnostic imaging of breast: Secondary | ICD-10-CM | POA: Insufficient documentation

## 2019-03-29 DIAGNOSIS — N6001 Solitary cyst of right breast: Secondary | ICD-10-CM | POA: Diagnosis not present

## 2019-04-20 ENCOUNTER — Ambulatory Visit (INDEPENDENT_AMBULATORY_CARE_PROVIDER_SITE_OTHER)
Admission: RE | Admit: 2019-04-20 | Discharge: 2019-04-20 | Disposition: A | Discharge status: Home / Self Care | Payer: 59 | Source: Ambulatory Visit

## 2019-04-20 DIAGNOSIS — R300 Dysuria: Secondary | ICD-10-CM

## 2019-04-20 DIAGNOSIS — N390 Urinary tract infection, site not specified: Secondary | ICD-10-CM

## 2019-04-20 MED ORDER — CEPHALEXIN 500 MG PO CAPS: 500.0000 mg | ORAL_CAPSULE | Freq: Two times a day (BID) | ORAL | 0 refills | Status: AC

## 2019-04-20 NOTE — ED Provider Notes (Signed)
Virtual Visit via Video Note:  Tracy Barnes  initiated request for Telemedicine visit with [facility] team. I connected with Tracy Barnes  on 04/20/2019 at 9:15 AM  for a synchronized telemedicine visit using a video enabled HIPPA compliant telemedicine application. I verified that I am speaking with Tracy Barnes  using two identifiers. Orvan July, NP  was physically located in a [facility] Urgent care site and Tracy Barnes was located at a different location.   The limitations of evaluation and management by telemedicine as well as the availability of in-person appointments were discussed. Patient was informed that she  may incur a bill ( including co-pay) for this virtual visit encounter. Tracy Barnes  expressed understanding and gave verbal consent to proceed with virtual visit.     History of Present Illness:Tracy Barnes  is a 63 y.o. female presents with dysuria, urinary frequency, lower abdominal discomfort.  Symptoms been constant for the past 2 days.  She has been drinking cranberry juice and took an over-the-counter AZO UTI test which was positive. Denies any associated fever, chills, flank pain, nausea, vomiting.  History of UTIs but feels the same.  Past Medical History:  Diagnosis Date  . Cancer ([place]) 02/19/2010   left breast  . Chest pain   . Diabetes mellitus   . Dyspnea on exertion   . GERD (gastroesophageal reflux disease)   . Hx of radiation therapy 06/18/10 to 08/01/10   L breast  . Hyperlipidemia   . Hypertension   . Personal history of chemotherapy 2011   LEFT lumpectomy w/ radiation and chemo  . Personal history of radiation therapy 2011   LEFT lumpectomy w/ radiation  . Thyroid disease     No Known Allergies      Observations/Objective: VITALS: Per patient if applicable, see vitals. GENERAL: Alert, appears well and in no acute distress. HEENT: Atraumatic, conjunctiva clear, no obvious abnormalities on inspection of  external nose and ears. NECK: Normal movements of the head and neck. CARDIOPULMONARY: No increased WOB. Speaking in clear sentences. I:E ratio WNL.  MS: Moves all visible extremities without noticeable abnormality. PSYCH: Pleasant and cooperative, well-groomed. Speech normal rate and rhythm. Affect is appropriate. Insight and judgement are appropriate. Attention is focused, linear, and appropriate.  NEURO: CN grossly intact. Oriented as arrived to appointment on time with no prompting. Moves both UE equally.  SKIN: No obvious lesions, wounds, erythema, or cyanosis noted on face or hands.    Assessment and Plan: Symptoms consistent with urinary tract infection.  Treating with Keflex twice a day for 7 days. Recommended push fluids   Follow Up Instructions: Follow up as needed for continued or worsening symptoms     I discussed the assessment and treatment plan with the patient. The patient was provided an opportunity to ask questions and all were answered. The patient agreed with the plan and demonstrated an understanding of the instructions.   The patient was advised to call back or seek an in-person evaluation if the symptoms worsen or if the condition fails to improve as anticipated.   Orvan July, NP  04/20/2019 9:15 AM         Orvan July, NP 04/20/19 1319

## 2019-04-20 NOTE — Discharge Instructions (Signed)
Take the medication as prescribed for UTI Drink plenty of water Follow up as needed for continued or worsening symptoms

## 2019-05-12 ENCOUNTER — Telehealth: Payer: Self-pay | Admitting: Internal Medicine

## 2019-05-12 NOTE — Telephone Encounter (Signed)
I called and spoke with the patient and advised her that I do not see where she needs to have a CT per the last office visit note and the last one she had was in 2018. I advised her that she is due for a yearly visit and PFT and I offered to schedule this for her since this was to be done in May of last year and she wanted me to send the provider a message to see about the CT scan. Please advise.

## 2019-05-15 NOTE — Telephone Encounter (Signed)
There is no strong need for a ct chest . However, if she wants options are  1. Do HRCT supine and prone -  between now and next visit - will have results ahead of visit  2. See me without CT and then decide on timing of CT chest -  Thanks    SIGNATURE    Dr. Brand Males, M.D., F.C.C.P,  Pulmonary and Critical Care Medicine Staff Physician, [place] Director - Interstitial Lung Disease  Program  Pulmonary [place] at [place], [place], [address]  Pager: [phone], If no answer or between  15:00h - 7:00h: call [phone] Telephone: [phone]  2:04 PM 05/15/2019

## 2019-05-15 NOTE — Telephone Encounter (Signed)
Checked and this patient does not have any future appointments scheduled.   LVMTCB x 1 for patient so that she can be scheduled for appointment. Last seen by Dr. Chase Caller on 09/02/17.

## 2019-05-16 NOTE — Telephone Encounter (Signed)
LMTCB

## 2019-05-17 NOTE — Telephone Encounter (Signed)
LMTCB x2 for pt 

## 2019-05-18 NOTE — Telephone Encounter (Addendum)
Left detailed message for pt. Due to several unsuccessful attempts to reach will sign off on message per triage protocol.

## 2019-05-31 DIAGNOSIS — K621 Rectal polyp: Secondary | ICD-10-CM | POA: Diagnosis not present

## 2019-05-31 DIAGNOSIS — D122 Benign neoplasm of ascending colon: Secondary | ICD-10-CM | POA: Diagnosis not present

## 2019-05-31 DIAGNOSIS — D128 Benign neoplasm of rectum: Secondary | ICD-10-CM | POA: Diagnosis not present

## 2019-05-31 DIAGNOSIS — Z1211 Encounter for screening for malignant neoplasm of colon: Secondary | ICD-10-CM | POA: Diagnosis not present

## 2019-05-31 DIAGNOSIS — K635 Polyp of colon: Secondary | ICD-10-CM | POA: Diagnosis not present

## 2019-07-17 ENCOUNTER — Other Ambulatory Visit: Payer: Self-pay | Admitting: Endocrinology

## 2019-08-04 ENCOUNTER — Ambulatory Visit
Admission: EM | Admit: 2019-08-04 | Discharge: 2019-08-04 | Disposition: A | Discharge status: Home / Self Care | Payer: 59

## 2019-08-04 ENCOUNTER — Other Ambulatory Visit: Payer: Self-pay

## 2019-08-04 ENCOUNTER — Encounter: Payer: Self-pay | Admitting: Emergency Medicine

## 2019-08-04 DIAGNOSIS — H9312 Tinnitus, left ear: Secondary | ICD-10-CM

## 2019-08-04 NOTE — ED Triage Notes (Signed)
Patient in UC for ear ringing for 4 months constant awake and lying down.  Denies:fever,n/v  OTC: Lavender oil

## 2019-08-04 NOTE — Discharge Instructions (Signed)
Try an over the counter antihistamine daily for the next 2 weeks. You may try benadryl at night.  Try eucalyptus oil as well.  You may also try a magnesium oxalate over the counter as well.   Do not get magnesium citrate.  If these are not effective for your ringing in your ears, keep your ENT appt.

## 2019-08-04 NOTE — ED Provider Notes (Signed)
[name]    CSN: [id number] Arrival date & time: 08/04/19  0935      History   Chief Complaint Chief Complaint  Patient presents with  . Ear ringing    HPI Tracy Barnes is a 64 y.o. female.   Patient reports that she has been experiencing tinnitus for the last almost 4 months.  Reports that she has placed lavender oil in her left ear to try to help this.  She denies that this is really doing any good she thinks.  Patient reports that she has never been seen for this in medical office before.  Patient reports that she does have an appointment with ENT next week, but could not wait to see them.  Patient denies taking any antihistamines to try to relieve what may be pressure in her ears.  Denies headache, nausea, cough, sore throat, vomiting, diarrhea, rash, fever, other symptoms.  ROS per HPI  The history is provided by the patient.    Past Medical History:  Diagnosis Date  . Cancer ([place]) 02/19/2010   left breast  . Chest pain   . Diabetes mellitus   . Dyspnea on exertion   . GERD (gastroesophageal reflux disease)   . Hx of radiation therapy 06/18/10 to 08/01/10   L breast  . Hyperlipidemia   . Hypertension   . Personal history of chemotherapy 2011   LEFT lumpectomy w/ radiation and chemo  . Personal history of radiation therapy 2011   LEFT lumpectomy w/ radiation  . Thyroid disease     Patient Active Problem List   Diagnosis Date Noted  . 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 Prediabetes 08/26/2015  . Bibasilar crackles 06/25/2015  . Gastro-esophageal reflux disease without esophagitis 04/23/2015  . Essential hypertension 03/19/2014  . Hyperlipidemia 03/19/2014  . Lung nodule 01/08/2014  . Dyspnea 01/08/2014  . Mediastinal adenopathy 12/19/2013  . Nodule of right lung 12/10/2013  . Dyspnea on exertion 09/01/2013  . Chest pain 09/01/2013  . Breast cancer, left breast ([place]) 05/01/2013  . Hx of radiation therapy   . Skin moles 09/19/2010  .  Wears glasses/contacts 09/19/2010  . 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 Menopause 09/19/2010  . Hormone replacement therapy (postmenopausal) 09/19/2010  . PLANTAR FASCIITIS, BILATERAL 02/05/2009  . ABNORMALITY OF GAIT 02/05/2009    Past Surgical History:  Procedure Laterality Date  . ABDOMINAL HYSTERECTOMY     At 10 yrs of age. One ovary removed. Surgery due to fibroids per medical history form dated 02/25/10.  Marland Kitchen BREAST LUMPECTOMY Left 2010   LEFT lumpectomy w/ radiation  . BREAST SURGERY  03/25/2010   left lumpectomy  . DEEP AXILLARY SENTINEL NODE BIOPSY / EXCISION  03/25/2010   4/4 nodes negative  . MEDIASTINOSCOPY N/A 12/27/2013   Procedure: MEDIASTINOSCOPY;  Surgeon: Melrose Nakayama, MD;  Location: [place];  Service: Thoracic;  Laterality: N/A;  . PORT-A-CATH REMOVAL  06/05/2011   Procedure: MINOR REMOVAL PORT-A-CATH;  Surgeon: Adin Hector, MD;  Location: [place];  Service: General;  Laterality: N/A;  right  . PORTACATH PLACEMENT  04/18/2010   Dr. Fanny Skates  . VIDEO BRONCHOSCOPY WITH ENDOBRONCHIAL ULTRASOUND N/A 12/27/2013   Procedure: VIDEO BRONCHOSCOPY WITH ENDOBRONCHIAL ULTRASOUND;  Surgeon: Melrose Nakayama, MD;  Location: [facility];  Service: Thoracic;  Laterality: N/A;    OB History   No obstetric history on file.      Home Medications    Prior to Admission  medications   Medication Sig Start Date End Date Taking? Authorizing Provider  ALPRAZolam (XANAX) 0.25 MG tablet Take 0.25 mg by mouth at bedtime as needed.      [provider]  aspirin EC 81 MG tablet Take 81 mg by mouth daily.    [provider]  ATORVASTATIN CALCIUM PO Take 20 mg by mouth daily.     [provider]  benzonatate (TESSALON) 200 MG capsule Take 1 capsule (200 mg total) by mouth 2 (two) times daily as needed for cough. 03/24/18   Darlin Priestly, PA-C  clobetasol cream (TEMOVATE) 0.05 % APPLY TO AFFECTED AREAS OF THE SKIN  ON CHEST TWICE DAILY FOR 2 WEEKS ON, THEN 2 WEEKS OFF 06/03/17   [provider]  desvenlafaxine (PRISTIQ) 100 MG 24 hr tablet Take 100 mg by mouth daily.    [provider]  fluticasone (FLONASE) 50 MCG/ACT nasal spray Place 2 sprays into both nostrils daily. 03/24/18   Darlin Priestly, PA-C  hydrochlorothiazide (HYDRODIURIL) 12.5 MG tablet Take 12.5 mg by mouth daily.    [provider]  levothyroxine (SYNTHROID, LEVOTHROID) 100 MCG tablet Take 100 mcg by mouth daily before breakfast.    [provider]  loratadine-pseudoephedrine (CLARITIN-D 24 HOUR) 10-240 MG 24 hr tablet Take 1 tablet by mouth daily. 03/24/18   Darlin Priestly, PA-C  metFORMIN (GLUCOPHAGE) 500 MG tablet Take 500 mg by mouth 2 (two) times daily with a meal.     [provider]  pantoprazole (PROTONIX) 40 MG tablet TAKE 1 TABLET BY MOUTH ONCE DAILY 04/23/15   Margarita Rana, MD  TRUE METRIX BLOOD GLUCOSE TEST test strip  09/10/15   [provider]  TRUEPLUS LANCETS 30G [place]  09/10/15   [provider]    Family History Family History  Problem Relation Age of Onset  . Cancer Mother        Cancer of bladder and brain per medical history form dated 02/25/10.  Marland Kitchen Heart disease Father   . COPD Father   . Aortic stenosis Father   . Breast cancer Neg Hx     Social History Social History   Tobacco Use  . Smoking status: Former Smoker    Packs/day: 0.10    Years: 0.50    Pack years: 0.05    Types: Cigarettes    Quit date: 04/20/1977    Years since quitting: 42.3  . Smokeless tobacco: Never Used  Substance Use Topics  . Alcohol use: No  . Drug use: No     Allergies   Patient has no known allergies.   Review of Systems Review of Systems   Physical Exam Triage Vital Signs ED Triage Vitals  Enc Vitals Group     BP 08/04/19 1007 133/82     Pulse Rate 08/04/19 1007 78     Resp 08/04/19 1007 18     Temp 08/04/19 1007 98.1 F (36.7 C)     Temp Source  08/04/19 1007 Oral     SpO2 08/04/19 1007 97 %     Weight 08/04/19 1005 185 lb (83.9 kg)     Height 08/04/19 1005 5\' 3"  (1.6 m)     Head Circumference --      Peak Flow --      Pain Score 08/04/19 1004 0     Pain Loc --      Pain Edu? --      Excl. in [place]? --    No data found.  Updated  Vital Signs BP 133/82 (BP Location: Right Arm)   Pulse 78   Temp 98.1 F (36.7 C) (Oral)   Resp 18   Ht 5\' 3"  (1.6 m)   Wt 185 lb (83.9 kg)   SpO2 97%   BMI 32.77 kg/m   Visual Acuity Right Eye Distance:   Left Eye Distance:   Bilateral Distance:    Right Eye Near:   Left Eye Near:    Bilateral Near:     Physical Exam Vitals and nursing note reviewed.  Constitutional:      General: She is not in acute distress.    Appearance: Normal appearance. She is well-developed.  HENT:     Head: Normocephalic and atraumatic.     Right Ear: Tympanic membrane normal.     Left Ear: Tympanic membrane normal.     Ears:     Comments: Mild effusion noted to both TMs.  No erythema, no bulging    Nose: Nose normal.     Mouth/Throat:     Mouth: Mucous membranes are moist.     Pharynx: Oropharynx is clear.  Eyes:     Conjunctiva/sclera: Conjunctivae normal.  Cardiovascular:     Rate and Rhythm: Normal rate and regular rhythm.     Heart sounds: Normal heart sounds. No murmur.  Pulmonary:     Effort: Pulmonary effort is normal. No respiratory distress.     Breath sounds: Normal breath sounds. No stridor. No wheezing, rhonchi or rales.  Chest:     Chest wall: No tenderness.  Abdominal:     General: Bowel sounds are normal.     Palpations: Abdomen is soft.     Tenderness: There is no abdominal tenderness.  Musculoskeletal:        General: Normal range of motion.     Cervical back: Normal range of motion and neck supple. No rigidity.  Lymphadenopathy:     Cervical: No cervical adenopathy.  Skin:    General: Skin is warm and dry.     Capillary Refill: Capillary refill takes less than 2 seconds.    Neurological:     General: No focal deficit present.     Mental Status: She is alert and oriented to person, place, and time.  Psychiatric:        Mood and Affect: Mood normal.        Behavior: Behavior normal.      UC Treatments / Results  Labs (all labs ordered are listed, but only abnormal results are displayed) Labs Reviewed - No data to display  EKG   Radiology No results found.  Procedures Procedures (including critical care time)  Medications Ordered in UC Medications - No data to display  Initial Impression / Assessment and Plan / UC Course  I have reviewed the triage vital signs and the nursing notes.  Pertinent labs & imaging results that were available during my care of the patient were reviewed by me and considered in my medical decision making (see chart for details).     Tinnitus: Present for the last 4 months in the left ear.  Instructed patient that she may also try eucalyptus oil and not here if she would like to continue to use essential oils.  Instructed patient that since there was mild fluid noted behind both eardrums, that she may also want to try something like Zyrtec, or maybe even a Benadryl at night.  Also instructed patient that she may try a magnesium supplement to try to get rid  of the tinnitus.  Instructed that if these remedies are ineffective, she needs to keep her appointment with ENT and go see them next week.  Patient to follow-up as needed.  Patient to follow-up with the ER for trouble swallowing, trouble breathing, other concerning symptoms.  Patient verbalized understanding and agreed to treatment plan. Final Clinical Impressions(s) / UC Diagnoses   Final diagnoses:  Tinnitus of left ear     Discharge Instructions     Try an over the counter antihistamine daily for the next 2 weeks. You may try benadryl at night.  Try eucalyptus oil as well.  You may also try a magnesium oxalate over the counter as well.   Do not get magnesium  citrate.  If these are not effective for your ringing in your ears, keep your ENT appt.     ED Prescriptions    None     PDMP not reviewed this encounter.   Faustino Congress, NP 08/04/19 1302

## 2019-08-07 DIAGNOSIS — H5213 Myopia, bilateral: Secondary | ICD-10-CM | POA: Diagnosis not present

## 2019-08-30 DIAGNOSIS — M79642 Pain in left hand: Secondary | ICD-10-CM | POA: Diagnosis not present

## 2019-08-30 DIAGNOSIS — M79641 Pain in right hand: Secondary | ICD-10-CM | POA: Diagnosis not present

## 2019-08-30 DIAGNOSIS — M65341 Trigger finger, right ring finger: Secondary | ICD-10-CM | POA: Diagnosis not present

## 2019-08-30 DIAGNOSIS — G5603 Carpal tunnel syndrome, bilateral upper limbs: Secondary | ICD-10-CM | POA: Diagnosis not present

## 2019-09-04 DIAGNOSIS — Z6833 Body mass index (BMI) 33.0-33.9, adult: Secondary | ICD-10-CM | POA: Diagnosis not present

## 2019-09-04 DIAGNOSIS — Z01419 Encounter for gynecological examination (general) (routine) without abnormal findings: Secondary | ICD-10-CM | POA: Diagnosis not present

## 2019-09-14 DIAGNOSIS — B372 Candidiasis of skin and nail: Secondary | ICD-10-CM | POA: Diagnosis not present

## 2019-09-19 DIAGNOSIS — G5601 Carpal tunnel syndrome, right upper limb: Secondary | ICD-10-CM | POA: Diagnosis not present

## 2019-09-19 DIAGNOSIS — M65341 Trigger finger, right ring finger: Secondary | ICD-10-CM | POA: Diagnosis not present

## 2019-09-25 ENCOUNTER — Other Ambulatory Visit: Payer: Self-pay | Admitting: Endocrinology

## 2019-10-03 DIAGNOSIS — M79641 Pain in right hand: Secondary | ICD-10-CM | POA: Diagnosis not present

## 2019-10-16 DIAGNOSIS — E119 Type 2 diabetes mellitus without complications: Secondary | ICD-10-CM | POA: Diagnosis not present

## 2019-10-16 DIAGNOSIS — Z0000 Encounter for general adult medical examination without abnormal findings: Secondary | ICD-10-CM | POA: Diagnosis not present

## 2019-10-16 DIAGNOSIS — E038 Other specified hypothyroidism: Secondary | ICD-10-CM | POA: Diagnosis not present

## 2019-11-03 DIAGNOSIS — I515 Myocardial degeneration: Secondary | ICD-10-CM | POA: Diagnosis not present

## 2019-11-03 DIAGNOSIS — J984 Other disorders of lung: Secondary | ICD-10-CM | POA: Diagnosis not present

## 2019-11-03 DIAGNOSIS — N200 Calculus of kidney: Secondary | ICD-10-CM | POA: Diagnosis not present

## 2019-11-03 DIAGNOSIS — R82998 Other abnormal findings in urine: Secondary | ICD-10-CM | POA: Diagnosis not present

## 2019-11-03 DIAGNOSIS — E038 Other specified hypothyroidism: Secondary | ICD-10-CM | POA: Diagnosis not present

## 2019-11-03 DIAGNOSIS — K760 Fatty (change of) liver, not elsewhere classified: Secondary | ICD-10-CM | POA: Diagnosis not present

## 2019-11-03 DIAGNOSIS — Z0000 Encounter for general adult medical examination without abnormal findings: Secondary | ICD-10-CM | POA: Diagnosis not present

## 2019-11-03 DIAGNOSIS — G4700 Insomnia, unspecified: Secondary | ICD-10-CM | POA: Diagnosis not present

## 2019-11-03 DIAGNOSIS — I700 Atherosclerosis of aorta: Secondary | ICD-10-CM | POA: Diagnosis not present

## 2019-11-03 DIAGNOSIS — E119 Type 2 diabetes mellitus without complications: Secondary | ICD-10-CM | POA: Diagnosis not present

## 2019-11-16 DIAGNOSIS — M2550 Pain in unspecified joint: Secondary | ICD-10-CM | POA: Diagnosis not present

## 2019-11-24 ENCOUNTER — Other Ambulatory Visit: Payer: Self-pay | Admitting: Internal Medicine

## 2019-12-13 DIAGNOSIS — Z23 Encounter for immunization: Secondary | ICD-10-CM | POA: Diagnosis not present

## 2019-12-15 DIAGNOSIS — L989 Disorder of the skin and subcutaneous tissue, unspecified: Secondary | ICD-10-CM | POA: Diagnosis not present

## 2019-12-15 DIAGNOSIS — Z7189 Other specified counseling: Secondary | ICD-10-CM | POA: Diagnosis not present

## 2020-01-23 ENCOUNTER — Other Ambulatory Visit: Payer: Self-pay | Admitting: Internal Medicine

## 2020-02-21 ENCOUNTER — Other Ambulatory Visit: Payer: Self-pay | Admitting: Internal Medicine

## 2020-03-25 ENCOUNTER — Other Ambulatory Visit: Payer: Self-pay | Admitting: Orthopedic Surgery

## 2020-03-25 DIAGNOSIS — G5602 Carpal tunnel syndrome, left upper limb: Secondary | ICD-10-CM | POA: Diagnosis not present

## 2020-03-25 DIAGNOSIS — M79642 Pain in left hand: Secondary | ICD-10-CM | POA: Diagnosis not present

## 2020-03-28 DIAGNOSIS — L820 Inflamed seborrheic keratosis: Secondary | ICD-10-CM | POA: Diagnosis not present

## 2020-03-28 DIAGNOSIS — L298 Other pruritus: Secondary | ICD-10-CM | POA: Diagnosis not present

## 2020-03-28 DIAGNOSIS — L570 Actinic keratosis: Secondary | ICD-10-CM | POA: Diagnosis not present

## 2020-03-28 DIAGNOSIS — D863 Sarcoidosis of skin: Secondary | ICD-10-CM | POA: Diagnosis not present

## 2020-04-02 ENCOUNTER — Other Ambulatory Visit: Payer: Self-pay | Admitting: Family Medicine

## 2020-04-02 DIAGNOSIS — R5081 Fever presenting with conditions classified elsewhere: Secondary | ICD-10-CM | POA: Diagnosis not present

## 2020-04-02 DIAGNOSIS — R509 Fever, unspecified: Secondary | ICD-10-CM | POA: Diagnosis not present

## 2020-04-02 DIAGNOSIS — D860 Sarcoidosis of lung: Secondary | ICD-10-CM | POA: Diagnosis not present

## 2020-04-02 DIAGNOSIS — R059 Cough, unspecified: Secondary | ICD-10-CM | POA: Diagnosis not present

## 2020-04-02 DIAGNOSIS — Z1152 Encounter for screening for COVID-19: Secondary | ICD-10-CM | POA: Diagnosis not present

## 2020-04-02 DIAGNOSIS — J101 Influenza due to other identified influenza virus with other respiratory manifestations: Secondary | ICD-10-CM | POA: Diagnosis not present

## 2020-04-05 DIAGNOSIS — Z4789 Encounter for other orthopedic aftercare: Secondary | ICD-10-CM | POA: Diagnosis not present

## 2020-04-05 DIAGNOSIS — G5601 Carpal tunnel syndrome, right upper limb: Secondary | ICD-10-CM | POA: Diagnosis not present

## 2020-05-01 DIAGNOSIS — N200 Calculus of kidney: Secondary | ICD-10-CM | POA: Diagnosis not present

## 2020-05-01 DIAGNOSIS — I10 Essential (primary) hypertension: Secondary | ICD-10-CM | POA: Diagnosis not present

## 2020-05-01 DIAGNOSIS — E669 Obesity, unspecified: Secondary | ICD-10-CM | POA: Diagnosis not present

## 2020-05-01 DIAGNOSIS — C50919 Malignant neoplasm of unspecified site of unspecified female breast: Secondary | ICD-10-CM | POA: Diagnosis not present

## 2020-05-01 DIAGNOSIS — D860 Sarcoidosis of lung: Secondary | ICD-10-CM | POA: Diagnosis not present

## 2020-05-01 DIAGNOSIS — K635 Polyp of colon: Secondary | ICD-10-CM | POA: Diagnosis not present

## 2020-05-01 DIAGNOSIS — E119 Type 2 diabetes mellitus without complications: Secondary | ICD-10-CM | POA: Diagnosis not present

## 2020-05-01 DIAGNOSIS — G4700 Insomnia, unspecified: Secondary | ICD-10-CM | POA: Diagnosis not present

## 2020-05-01 DIAGNOSIS — I700 Atherosclerosis of aorta: Secondary | ICD-10-CM | POA: Diagnosis not present

## 2020-05-15 ENCOUNTER — Ambulatory Visit
Admission: EM | Admit: 2020-05-15 | Discharge: 2020-05-15 | Disposition: A | Discharge status: Home / Self Care | Payer: 59 | Attending: Family Medicine | Admitting: Family Medicine

## 2020-05-15 ENCOUNTER — Encounter: Payer: Self-pay | Admitting: Family Medicine

## 2020-05-15 DIAGNOSIS — J329 Chronic sinusitis, unspecified: Secondary | ICD-10-CM

## 2020-05-15 DIAGNOSIS — B9789 Other viral agents as the cause of diseases classified elsewhere: Secondary | ICD-10-CM

## 2020-05-15 DIAGNOSIS — Z7689 Persons encountering health services in other specified circumstances: Secondary | ICD-10-CM | POA: Diagnosis not present

## 2020-05-15 NOTE — Discharge Instructions (Addendum)
Flonase nasal spray 2 sprays daily Sinus congestion medicines as needed Covid test pending.  Follow up as needed for continued or worsening symptoms

## 2020-05-15 NOTE — ED Provider Notes (Signed)
[name]    CSN: [id number] Arrival date & time: 05/15/20  0856      History   Chief Complaint Chief Complaint  Patient presents with  . Otalgia  . Cough  . Nasal Congestion    HPI Tracy Barnes is a 65 y.o. female.   Patient is a 65 year old female presents today with productive cough, sinus pressure, sinus congestion, runny nose, green secretions, ear pain, sore throat, subjective low-grade fever onset Sunday.  Took Advil Cold and Sinus.     Past Medical History:  Diagnosis Date  . Cancer ([place]) 02/19/2010   left breast  . Chest pain   . Diabetes mellitus   . Dyspnea on exertion   . GERD (gastroesophageal reflux disease)   . Hx of radiation therapy 06/18/10 to 08/01/10   L breast  . Hyperlipidemia   . Hypertension   . Personal history of chemotherapy 2011   LEFT lumpectomy w/ radiation and chemo  . Personal history of radiation therapy 2011   LEFT lumpectomy w/ radiation  . Thyroid disease     Patient Active Problem List   Diagnosis Date Noted  . 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 Prediabetes 08/26/2015  . Bibasilar crackles 06/25/2015  . Gastro-esophageal reflux disease without esophagitis 04/23/2015  . Essential hypertension 03/19/2014  . Hyperlipidemia 03/19/2014  . Lung nodule 01/08/2014  . Dyspnea 01/08/2014  . Mediastinal adenopathy 12/19/2013  . Nodule of right lung 12/10/2013  . Dyspnea on exertion 09/01/2013  . Chest pain 09/01/2013  . Breast cancer, left breast ([place]) 05/01/2013  . Hx of radiation therapy   . Skin moles 09/19/2010  . Wears glasses/contacts 09/19/2010  . 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 Menopause 09/19/2010  . Hormone replacement therapy (postmenopausal) 09/19/2010  . PLANTAR FASCIITIS, BILATERAL 02/05/2009  . ABNORMALITY OF GAIT 02/05/2009    Past Surgical History:  Procedure Laterality Date  . ABDOMINAL HYSTERECTOMY     At 27 yrs of age. One ovary removed. Surgery due to  fibroids per medical history form dated 02/25/10.  Marland Kitchen BREAST LUMPECTOMY Left 2010   LEFT lumpectomy w/ radiation  . BREAST SURGERY  03/25/2010   left lumpectomy  . DEEP AXILLARY SENTINEL NODE BIOPSY / EXCISION  03/25/2010   4/4 nodes negative  . MEDIASTINOSCOPY N/A 12/27/2013   Procedure: MEDIASTINOSCOPY;  Surgeon: Melrose Nakayama, MD;  Location: [facility];  Service: Thoracic;  Laterality: N/A;  . PORT-A-CATH REMOVAL  06/05/2011   Procedure: MINOR REMOVAL PORT-A-CATH;  Surgeon: Adin Hector, MD;  Location: [facility];  Service: General;  Laterality: N/A;  right  . PORTACATH PLACEMENT  04/18/2010   Dr. Fanny Skates  . VIDEO BRONCHOSCOPY WITH ENDOBRONCHIAL ULTRASOUND N/A 12/27/2013   Procedure: VIDEO BRONCHOSCOPY WITH ENDOBRONCHIAL ULTRASOUND;  Surgeon: Melrose Nakayama, MD;  Location: [facility];  Service: Thoracic;  Laterality: N/A;    OB History   No obstetric history on file.      Home Medications    Prior to Admission medications   Medication Sig Start Date End Date Taking? Authorizing Provider  ALPRAZolam (XANAX) 0.25 MG tablet Take 0.25 mg by mouth at bedtime as needed.      [provider]  aspirin EC 81 MG tablet Take 81 mg by mouth daily.    [provider]  ATORVASTATIN CALCIUM PO Take 20 mg by mouth daily.     [provider]  benzonatate (TESSALON) 200 MG capsule Take 1 capsule (200  mg total) by mouth 2 (two) times daily as needed for cough. 03/24/18   Darlin Priestly, PA-C  clobetasol cream (TEMOVATE) 0.05 % APPLY TO AFFECTED AREAS OF THE SKIN ON CHEST TWICE DAILY FOR 2 WEEKS ON, THEN 2 WEEKS OFF 06/03/17   [provider]  desvenlafaxine (PRISTIQ) 100 MG 24 hr tablet Take 100 mg by mouth daily.    [provider]  fluticasone (FLONASE) 50 MCG/ACT nasal spray Place 2 sprays into both nostrils daily. 03/24/18   Darlin Priestly, PA-C  hydrochlorothiazide (HYDRODIURIL) 12.5 MG tablet Take 12.5 mg by mouth daily.     [provider]  levothyroxine (SYNTHROID, LEVOTHROID) 100 MCG tablet Take 100 mcg by mouth daily before breakfast.    [provider]  loratadine-pseudoephedrine (CLARITIN-D 24 HOUR) 10-240 MG 24 hr tablet Take 1 tablet by mouth daily. 03/24/18   Darlin Priestly, PA-C  metFORMIN (GLUCOPHAGE) 500 MG tablet Take 500 mg by mouth 2 (two) times daily with a meal.     [provider]  pantoprazole (PROTONIX) 40 MG tablet TAKE 1 TABLET BY MOUTH ONCE DAILY 04/23/15   Margarita Rana, MD  TRUE METRIX BLOOD GLUCOSE TEST test strip  09/10/15   [provider]  TRUEPLUS LANCETS 30G [place]  09/10/15   [provider]    Family History Family History  Problem Relation Age of Onset  . Cancer Mother        Cancer of bladder and brain per medical history form dated 02/25/10.  Marland Kitchen Heart disease Father   . COPD Father   . Aortic stenosis Father   . Breast cancer Neg Hx     Social History Social History   Tobacco Use  . Smoking status: Former Smoker    Packs/day: 0.10    Years: 0.50    Pack years: 0.05    Types: Cigarettes    Quit date: 04/20/1977    Years since quitting: 43.0  . Smokeless tobacco: Never Used  Substance Use Topics  . Alcohol use: No  . Drug use: No     Allergies   Patient has no known allergies.   Review of Systems Review of Systems   Physical Exam Triage Vital Signs ED Triage Vitals  Enc Vitals Group     BP 05/15/20 0912 133/87     Pulse Rate 05/15/20 0912 86     Resp 05/15/20 0912 16     Temp 05/15/20 0912 98.3 F (36.8 C)     Temp Source 05/15/20 0912 Oral     SpO2 05/15/20 0912 96 %     Weight --      Height --      Head Circumference --      Peak Flow --      Pain Score 05/15/20 0921 5     Pain Loc --      Pain Edu? --      Excl. in [place]? --    No data found.  Updated Vital Signs BP 133/87 (BP Location: Left Arm)   Pulse 86   Temp 98.3 F (36.8 C) (Oral)   Resp 16   SpO2 96%   Visual Acuity Right Eye  Distance:   Left Eye Distance:   Bilateral Distance:    Right Eye Near:   Left Eye Near:    Bilateral Near:     Physical Exam Vitals and nursing note reviewed.  Constitutional:      General: She is not in acute distress.  Appearance: Normal appearance. She is not ill-appearing, toxic-appearing or diaphoretic.  HENT:     Head: Normocephalic.     Right Ear: Tympanic membrane and ear canal normal.     Left Ear: Tympanic membrane and ear canal normal.     Nose: Congestion present.     Mouth/Throat:     Pharynx: Oropharynx is clear.  Eyes:     Conjunctiva/sclera: Conjunctivae normal.  Cardiovascular:     Rate and Rhythm: Normal rate and regular rhythm.  Pulmonary:     Effort: Pulmonary effort is normal.     Breath sounds: Normal breath sounds.  Musculoskeletal:        General: Normal range of motion.     Cervical back: Normal range of motion.  Skin:    General: Skin is warm and dry.     Findings: No rash.  Neurological:     Mental Status: She is alert.  Psychiatric:        Mood and Affect: Mood normal.      UC Treatments / Results  Labs (all labs ordered are listed, but only abnormal results are displayed) Labs Reviewed  NOVEL CORONAVIRUS, NAA    EKG   Radiology No results found.  Procedures Procedures (including critical care time)  Medications Ordered in UC Medications - No data to display  Initial Impression / Assessment and Plan / UC Course  I have reviewed the triage vital signs and the nursing notes.  Pertinent labs & imaging results that were available during my care of the patient were reviewed by me and considered in my medical decision making (see chart for details).     Viral sinusitis Recommended continue over-the-counter medicines as needed.  Flonase nasal spray daily  Covid swab pending. Follow up as needed for continued or worsening symptoms   Final Clinical Impressions(s) / UC Diagnoses   Final diagnoses:  Viral sinusitis      Discharge Instructions     Flonase nasal spray 2 sprays daily Sinus congestion medicines as needed Covid test pending.  Follow up as needed for continued or worsening symptoms     ED Prescriptions    None     PDMP not reviewed this encounter.   Orvan July, NP 05/15/20 1055

## 2020-05-15 NOTE — ED Triage Notes (Signed)
Pt c/o productive cough, sinus pressure/pain, runny nose with green secretions, left ear pain, sore throat, subjective low-grade fever onset Sunday.  Denies n/v/d, SOB Took advil cold on Monday. Requesting Z-pack

## 2020-05-17 LAB — NOVEL CORONAVIRUS, NAA: SARS-CoV-2, NAA: NOT DETECTED

## 2020-05-17 LAB — SARS-COV-2, NAA 2 DAY TAT

## 2020-06-14 DIAGNOSIS — H43813 Vitreous degeneration, bilateral: Secondary | ICD-10-CM | POA: Diagnosis not present

## 2020-06-14 DIAGNOSIS — M3501 Sicca syndrome with keratoconjunctivitis: Secondary | ICD-10-CM | POA: Diagnosis not present

## 2020-06-27 ENCOUNTER — Other Ambulatory Visit: Payer: Self-pay

## 2020-06-27 ENCOUNTER — Other Ambulatory Visit: Payer: Self-pay | Admitting: Family Medicine

## 2020-06-27 ENCOUNTER — Ambulatory Visit
Admission: RE | Admit: 2020-06-27 | Discharge: 2020-06-27 | Disposition: A | Discharge status: Home / Self Care | Payer: 59 | Source: Ambulatory Visit | Attending: Family Medicine | Admitting: Family Medicine

## 2020-06-27 VITALS — BP 166/90 | HR 82 | Temp 97.8°F | Resp 18 | Ht 63.0 in | Wt 187.0 lb

## 2020-06-27 DIAGNOSIS — R1084 Generalized abdominal pain: Secondary | ICD-10-CM

## 2020-06-27 HISTORY — DX: Sarcoidosis, unspecified: D86.9

## 2020-06-27 LAB — POCT URINALYSIS DIP (MANUAL ENTRY)
Bilirubin, UA: NEGATIVE
Blood, UA: NEGATIVE
Glucose, UA: NEGATIVE mg/dL
Ketones, POC UA: NEGATIVE mg/dL
Leukocytes, UA: NEGATIVE
Nitrite, UA: NEGATIVE
Protein Ur, POC: NEGATIVE mg/dL
Spec Grav, UA: 1.025 (ref 1.010–1.025)
Urobilinogen, UA: 0.2 E.U./dL
pH, UA: 5.5 (ref 5.0–8.0)

## 2020-06-27 MED ORDER — AMOXICILLIN-POT CLAVULANATE 875-125 MG PO TABS: 1.0000 | ORAL_TABLET | Freq: Two times a day (BID) | ORAL | 0 refills | Status: DC

## 2020-06-27 NOTE — ED Triage Notes (Signed)
Pt presents today with c/o of abdominal pain and urine hesitancy x 4 days. Took home test (positive).

## 2020-06-27 NOTE — Discharge Instructions (Addendum)
Believe that your symptoms may be related to constipation.  Recommend treatment with MiraLAX You can do this up to twice a day until good bowel movement.  Decrease with loose stools Increase water and fiber If your pain worsens and symptoms not resolve go ahead and start the antibiotics in case this is a diverticulitis flare For worsening problems go to the ER

## 2020-06-27 NOTE — ED Provider Notes (Signed)
Tracy Barnes    CSN: [id number] Arrival date & time: 06/27/20  5053      History   Chief Complaint Chief Complaint  Patient presents with  . Abdominal Pain  . Dysuria    HPI Tracy Barnes is Barnes 65 y.o. female.   Patient is Barnes 65 year old female past medical history of breast cancer, chest pain, diabetes, GERD, thyroid disease, sarcoidosis, hyperlipidemia, hypertension.  She presents today with approximately 4 days of generalized abdominal discomfort, urinary hesitancy.  Was concern for UTI.  Took at home UTI test that was positive for leukocytes. Described the pain as dull but sharp at times with radiation into back.  No dysuria, hematuria, fevers or chills.  No nausea, vomiting or diarrhea.  Does have Barnes history of diverticulosis.  Has been constipated over the past 4 to 5 days.  Took stool softener but is still been having small bowel movements.  No blood in stool.     Past Medical History:  Diagnosis Date  . Cancer ([place]) 02/19/2010   left breast  . Chest pain   . Diabetes mellitus   . Dyspnea on exertion   . GERD (gastroesophageal reflux disease)   . Hx of radiation therapy 06/18/10 to 08/01/10   L breast  . Hyperlipidemia   . Hypertension   . Personal history of chemotherapy 2011   LEFT lumpectomy w/ radiation and chemo  . Personal history of radiation therapy 2011   LEFT lumpectomy w/ radiation  . Sarcoidosis   . Thyroid disease     Patient Active Problem List   Diagnosis Date Noted  . Pulmonary sarcoidosis ([place]) 08/29/2015  . Prediabetes 08/26/2015  . Bibasilar crackles 06/25/2015  . Gastro-esophageal reflux disease without esophagitis 04/23/2015  . Essential hypertension 03/19/2014  . Hyperlipidemia 03/19/2014  . Lung nodule 01/08/2014  . Dyspnea 01/08/2014  . Mediastinal adenopathy 12/19/2013  . Nodule of right lung 12/10/2013  . Dyspnea on exertion 09/01/2013  . Chest pain 09/01/2013  . Breast cancer, left breast ([place]) 05/01/2013  . Hx of  radiation therapy   . Skin moles 09/19/2010  . Wears glasses/contacts 09/19/2010  . 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 Menopause 09/19/2010  . Hormone replacement therapy (postmenopausal) 09/19/2010  . PLANTAR FASCIITIS, BILATERAL 02/05/2009  . ABNORMALITY OF GAIT 02/05/2009    Past Surgical History:  Procedure Laterality Date  . ABDOMINAL HYSTERECTOMY     At 55 yrs of age. One ovary removed. Surgery due to fibroids per medical history form dated 02/25/10.  Marland Kitchen BREAST LUMPECTOMY Left 2010   LEFT lumpectomy w/ radiation  . BREAST SURGERY  03/25/2010   left lumpectomy  . DEEP AXILLARY SENTINEL NODE BIOPSY / EXCISION  03/25/2010   4/4 nodes negative  . MEDIASTINOSCOPY N/Barnes 12/27/2013   Procedure: MEDIASTINOSCOPY;  Surgeon: Melrose Nakayama, MD;  Location: [facility];  Service: Thoracic;  Laterality: N/Barnes;  . PORT-Barnes-CATH REMOVAL  06/05/2011   Procedure: MINOR REMOVAL PORT-Barnes-CATH;  Surgeon: Adin Hector, MD;  Location: [facility];  Service: General;  Laterality: N/Barnes;  right  . PORTACATH PLACEMENT  04/18/2010   Dr. Fanny Skates  . VIDEO BRONCHOSCOPY WITH ENDOBRONCHIAL ULTRASOUND N/Barnes 12/27/2013   Procedure: VIDEO BRONCHOSCOPY WITH ENDOBRONCHIAL ULTRASOUND;  Surgeon: Melrose Nakayama, MD;  Location: [facility];  Service: Thoracic;  Laterality: N/Barnes;    OB History   No obstetric history on file.      Home Medications    Prior to Admission medications  Medication Sig Start Date End Date Taking? Authorizing Provider  ALPRAZolam (XANAX) 0.25 MG tablet Take 0.25 mg by mouth at bedtime as needed.   Yes [provider]  aspirin EC 81 MG tablet Take 81 mg by mouth daily.   Yes [provider]  ATORVASTATIN CALCIUM PO Take 20 mg by mouth daily.    Yes [provider]  desvenlafaxine (PRISTIQ) 100 MG 24 hr tablet Take 100 mg by mouth daily.   Yes [provider]  hydrochlorothiazide (HYDRODIURIL) 12.5 MG tablet Take 12.5 mg  by mouth daily.   Yes [provider]  levothyroxine (SYNTHROID, LEVOTHROID) 100 MCG tablet Take 100 mcg by mouth daily before breakfast.   Yes [provider]  metFORMIN (GLUCOPHAGE) 500 MG tablet Take 500 mg by mouth 2 (two) times daily with Barnes meal.    Yes [provider]  amoxicillin-clavulanate (AUGMENTIN) 875-125 MG tablet Take 1 tablet by mouth every 12 (twelve) hours. 06/27/20   Tracy Barnes, Tracy Miners A, NP  clobetasol cream (TEMOVATE) 0.05 % APPLY TO AFFECTED AREAS OF THE SKIN ON CHEST TWICE DAILY FOR 2 WEEKS ON, THEN 2 WEEKS OFF 06/03/17   [provider]  fluticasone (FLONASE) 50 MCG/ACT nasal spray Place 2 sprays into both nostrils daily. 03/24/18   Darlin Priestly, PA-C  TRUE METRIX BLOOD GLUCOSE TEST test strip  09/10/15   [provider]  TRUEPLUS LANCETS 30G [place]  09/10/15   [provider]  pantoprazole (PROTONIX) 40 MG tablet TAKE 1 TABLET BY MOUTH ONCE DAILY 04/23/15 06/27/20  Tracy Rana, MD    Family History Family History  Problem Relation Age of Onset  . Cancer Mother        Cancer of bladder and brain per medical history form dated 02/25/10.  Marland Kitchen Heart disease Father   . COPD Father   . Aortic stenosis Father   . Breast cancer Neg Hx     Social History Social History   Tobacco Use  . Smoking status: Former Smoker    Packs/day: 0.10    Years: 0.50    Pack years: 0.05    Types: Cigarettes    Quit date: 04/20/1977    Years since quitting: 43.2  . Smokeless tobacco: Never Used  Substance Use Topics  . Alcohol use: No  . Drug use: No     Allergies   Patient has no known allergies.   Review of Systems Review of Systems   Physical Exam Triage Vital Signs ED Triage Vitals  Enc Vitals Group     BP 06/27/20 1006 (!) 166/90     Pulse Rate 06/27/20 1006 82     Resp 06/27/20 1006 18     Temp 06/27/20 1006 97.8 F (36.6 C)     Temp Source 06/27/20 1006 Oral     SpO2 06/27/20 1006 97 %     Weight 06/27/20 1007 187 lb  (84.8 kg)     Height 06/27/20 1007 5\' 3"  (1.6 m)     Head Circumference --      Peak Flow --      Pain Score 06/27/20 1007 0     Pain Loc --      Pain Edu? --      Excl. in [place]? --    No data found.  Updated Vital Signs BP (!) 166/90 (BP Location: Left Arm)   Pulse 82   Temp 97.8 F (36.6 C) (Oral)   Resp 18   Ht 5\' 3"  (1.6 m)  Wt 187 lb (84.8 kg)   SpO2 97%   BMI 33.13 kg/m   Visual Acuity Right Eye Distance:   Left Eye Distance:   Bilateral Distance:    Right Eye Near:   Left Eye Near:    Bilateral Near:     Physical Exam Vitals and nursing note reviewed.  Constitutional:      General: She is not in acute distress.    Appearance: Normal appearance. She is not ill-appearing, toxic-appearing or diaphoretic.  HENT:     Head: Normocephalic.  Eyes:     Conjunctiva/sclera: Conjunctivae normal.  Cardiovascular:     Rate and Rhythm: Normal rate and regular rhythm.  Pulmonary:     Effort: Pulmonary effort is normal.     Breath sounds: Normal breath sounds.  Abdominal:     General: Bowel sounds are decreased. There is no distension.     Palpations: Abdomen is soft.     Tenderness: There is abdominal tenderness in the epigastric area, left upper quadrant and left lower quadrant. There is no guarding.  Musculoskeletal:        General: Normal range of motion.     Cervical back: Normal range of motion.  Skin:    General: Skin is warm and dry.     Findings: No rash.  Neurological:     Mental Status: She is alert.  Psychiatric:        Mood and Affect: Mood normal.      UC Treatments / Results  Labs (all labs ordered are listed, but only abnormal results are displayed) Labs Reviewed  POCT URINALYSIS DIP (MANUAL ENTRY)    EKG   Radiology No results found.  Procedures Procedures (including critical care time)  Medications Ordered in UC Medications - No data to display  Initial Impression / Assessment and Plan / UC Course  I have reviewed the triage  vital signs and the nursing notes.  Pertinent labs & imaging results that were available during my care of the patient were reviewed by me and considered in my medical decision making (see chart for details).     Generalized abdominal pain, more in the LLQ.  No guarding or rebound.  Differential include, constipation, diverticulitis, small bowel obstruction, UTI Urine negative for infection here.  Most likely constipation.  There is no acute abdomen on exam.  Vital signs are stable and she is nontoxic or ill-appearing.  No concerning signs or symptoms. Recommend treat for constipation with MiraLAX.  Patient concern for possible diverticulitis based on history of diverticulosis and daughter history of diverticulitis with hospitalization.  Recommended try MiraLAX first over the next day to see if this improves her symptoms.  If her pain gets worse or she starts developing fever or diarrhea she can go ahead and start taking the Augmentin for possible diverticulitis flare.  If her pain or symptoms worsen then she will need to go straight to the ER for CT scan and further management Patient understanding and agreed Final Clinical Impressions(s) / UC Diagnoses   Final diagnoses:  Generalized abdominal pain     Discharge Instructions     Believe that your symptoms may be related to constipation.  Recommend treatment with MiraLAX You can do this up to twice Barnes day until good bowel movement.  Decrease with loose stools Increase water and fiber If your pain worsens and symptoms not resolve go ahead and start the antibiotics in case this is Barnes diverticulitis flare For worsening problems go to the ER  ED Prescriptions    Medication Sig Dispense Auth. Provider   amoxicillin-clavulanate (AUGMENTIN) 875-125 MG tablet  (Status: Discontinued) Take 1 tablet by mouth every 12 (twelve) hours. 14 tablet Brandin Stetzer A, NP   amoxicillin-clavulanate (AUGMENTIN) 875-125 MG tablet Take 1 tablet by mouth every  12 (twelve) hours. 14 tablet Sanjith Siwek A, NP     PDMP not reviewed this encounter.   Orvan July, NP 06/27/20 1034

## 2020-07-02 DIAGNOSIS — R140 Abdominal distension (gaseous): Secondary | ICD-10-CM | POA: Diagnosis not present

## 2020-07-02 DIAGNOSIS — K219 Gastro-esophageal reflux disease without esophagitis: Secondary | ICD-10-CM | POA: Diagnosis not present

## 2020-07-02 DIAGNOSIS — R1033 Periumbilical pain: Secondary | ICD-10-CM | POA: Diagnosis not present

## 2020-07-02 DIAGNOSIS — R102 Pelvic and perineal pain: Secondary | ICD-10-CM | POA: Diagnosis not present

## 2020-07-02 DIAGNOSIS — K5904 Chronic idiopathic constipation: Secondary | ICD-10-CM | POA: Diagnosis not present

## 2020-07-02 DIAGNOSIS — R309 Painful micturition, unspecified: Secondary | ICD-10-CM | POA: Diagnosis not present

## 2020-07-02 DIAGNOSIS — K5730 Diverticulosis of large intestine without perforation or abscess without bleeding: Secondary | ICD-10-CM | POA: Diagnosis not present

## 2020-07-02 DIAGNOSIS — E669 Obesity, unspecified: Secondary | ICD-10-CM | POA: Diagnosis not present

## 2020-07-23 ENCOUNTER — Other Ambulatory Visit: Payer: Self-pay

## 2020-07-23 ENCOUNTER — Other Ambulatory Visit: Payer: Self-pay | Admitting: Endocrinology

## 2020-07-23 MED FILL — Levothyroxine Sodium Tab 100 MCG: ORAL | 90 days supply | Qty: 90 | Fill #0 | Status: AC

## 2020-07-30 ENCOUNTER — Other Ambulatory Visit: Payer: Self-pay

## 2020-07-31 ENCOUNTER — Other Ambulatory Visit: Payer: Self-pay

## 2020-08-01 ENCOUNTER — Other Ambulatory Visit: Payer: Self-pay

## 2020-08-01 ENCOUNTER — Other Ambulatory Visit: Payer: Self-pay | Admitting: Endocrinology

## 2020-08-01 MED ORDER — ZOLPIDEM TARTRATE 5 MG PO TABS
ORAL_TABLET | ORAL | 0 refills | Status: DC
  Filled 2020-08-01: qty 90, 90d supply, fill #0

## 2020-08-01 MED ORDER — LEVOTHYROXINE SODIUM 100 MCG PO TABS
ORAL_TABLET | ORAL | 3 refills | Status: DC
  Filled 2020-08-01 – 2020-11-06 (×2): qty 90, 90d supply, fill #0
  Filled 2021-02-19: qty 90, 90d supply, fill #1
  Filled 2021-05-22: qty 90, 90d supply, fill #2

## 2020-08-01 MED ORDER — HYDROCHLOROTHIAZIDE 12.5 MG PO CAPS
ORAL_CAPSULE | ORAL | 3 refills | Status: DC
  Filled 2020-08-01: qty 90, 90d supply, fill #0
  Filled 2020-11-05: qty 90, 90d supply, fill #1
  Filled 2021-01-06 – 2021-01-08 (×2): qty 90, 90d supply, fill #2

## 2020-08-01 MED ORDER — ATORVASTATIN CALCIUM 20 MG PO TABS
ORAL_TABLET | ORAL | 3 refills | Status: DC
  Filled 2020-08-01: qty 90, 90d supply, fill #0
  Filled 2020-10-22: qty 90, 90d supply, fill #1
  Filled 2021-02-19: qty 90, 90d supply, fill #2
  Filled 2021-05-05: qty 90, 90d supply, fill #3

## 2020-08-01 MED ORDER — METFORMIN HCL ER 500 MG PO TB24
ORAL_TABLET | ORAL | 3 refills | Status: DC
  Filled 2020-08-01: qty 180, 90d supply, fill #0
  Filled 2020-11-05 – 2020-11-06 (×3): qty 180, 90d supply, fill #1
  Filled 2021-02-19: qty 180, 90d supply, fill #2
  Filled 2021-05-22: qty 180, 90d supply, fill #3

## 2020-08-01 MED ORDER — DESVENLAFAXINE SUCCINATE ER 100 MG PO TB24
ORAL_TABLET | ORAL | 3 refills | Status: DC
  Filled 2020-08-01 – 2020-12-10 (×2): qty 90, 90d supply, fill #0
  Filled 2021-03-10: qty 90, 90d supply, fill #1
  Filled 2021-06-12: qty 90, 90d supply, fill #2

## 2020-08-01 MED ORDER — ALPRAZOLAM 0.25 MG PO TABS
ORAL_TABLET | ORAL | 0 refills | Status: DC
  Filled 2020-08-01: qty 60, 30d supply, fill #0

## 2020-08-02 ENCOUNTER — Other Ambulatory Visit: Payer: Self-pay

## 2020-08-05 ENCOUNTER — Other Ambulatory Visit: Payer: Self-pay

## 2020-08-05 ENCOUNTER — Other Ambulatory Visit: Payer: Self-pay | Admitting: Endocrinology

## 2020-08-06 ENCOUNTER — Other Ambulatory Visit: Payer: Self-pay

## 2020-08-07 ENCOUNTER — Other Ambulatory Visit: Payer: Self-pay

## 2020-08-09 ENCOUNTER — Other Ambulatory Visit: Payer: Self-pay

## 2020-08-09 MED ORDER — ZOLPIDEM TARTRATE 5 MG PO TABS
ORAL_TABLET | ORAL | 0 refills | Status: DC
  Filled 2020-08-09: qty 30, 30d supply, fill #0

## 2020-08-12 ENCOUNTER — Telehealth: Payer: 59 | Admitting: Emergency Medicine

## 2020-08-12 ENCOUNTER — Other Ambulatory Visit: Payer: Self-pay

## 2020-08-12 DIAGNOSIS — J069 Acute upper respiratory infection, unspecified: Secondary | ICD-10-CM | POA: Diagnosis not present

## 2020-08-12 MED ORDER — AZITHROMYCIN 250 MG PO TABS
ORAL_TABLET | ORAL | 0 refills | Status: DC
  Filled 2020-08-12: qty 6, 5d supply, fill #0

## 2020-08-12 MED ORDER — BENZONATATE 100 MG PO CAPS
100.0000 mg | ORAL_CAPSULE | Freq: Three times a day (TID) | ORAL | 0 refills | Status: DC | PRN
  Filled 2020-08-12: qty 20, 3d supply, fill #0

## 2020-08-12 MED ORDER — IPRATROPIUM BROMIDE 0.03 % NA SOLN
NASAL | 0 refills | Status: DC
  Filled 2020-08-12: qty 30, 30d supply, fill #0

## 2020-08-12 NOTE — Progress Notes (Signed)
We are sorry you are not feeling well.  Here is how we plan to help!  Based on what you have shared with me, it looks like you may have a viral upper respiratory infection.  Antibiotics are not warranted at this time based on what you have shared with [place].  Upper respiratory infections are caused by a large number of viruses; however, rhinovirus is the most common cause.   Symptoms vary from person to person, with common symptoms including sore throat, cough, fatigue or lack of energy and feeling of general discomfort.  A low-grade fever of up to 100.4 may present, but is often uncommon.  Symptoms vary however, and are closely related to a person's age or underlying illnesses.  The most common symptoms associated with an upper respiratory infection are nasal discharge or congestion, cough, sneezing, headache and pressure in the ears and face.  These symptoms usually persist for about 3 to 10 days, but can last up to 2 weeks.  It is important to know that upper respiratory infections do not cause serious illness or complications in most cases.    Upper respiratory infections can be transmitted from person to person, with the most common method of transmission being a person's hands.  The virus is able to live on the skin and can infect other persons for up to 2 hours after direct contact.  Also, these can be transmitted when someone coughs or sneezes; thus, it is important to cover the mouth to reduce this risk.  To keep the spread of the illness at [place], good hand hygiene is very important.  This is an infection that is most likely caused by a virus. There are no specific treatments other than to help you with the symptoms until the infection runs its course.  We are sorry you are not feeling well.  Here is how we plan to help!   For nasal congestion, you may use an oral decongestants such as Mucinex D or if you have glaucoma or high blood pressure use plain Mucinex.  Saline nasal spray or nasal drops can  help and can safely be used as often as needed for congestion.  For your congestion, I have prescribed Ipratropium Bromide nasal spray 0.03% two sprays in each nostril 2-3 times a day  If you do not have a history of heart disease, hypertension, diabetes or thyroid disease, prostate/bladder issues or glaucoma, you may also use Sudafed to treat nasal congestion.  It is highly recommended that you consult with a pharmacist or your primary care physician to ensure this medication is safe for you to take.     If you have a cough, you may use cough suppressants such as Delsym and Robitussin.  If you have glaucoma or high blood pressure, you can also use Coricidin HBP.   For cough I have prescribed for you A prescription cough medication called Tessalon Perles 100 mg. You may take 1-2 capsules every 8 hours as needed for cough  If you have a sore or scratchy throat, use a saltwater gargle-  to  teaspoon of salt dissolved in a 4-ounce to 8-ounce glass of warm water.  Gargle the solution for approximately 15-30 seconds and then spit.  It is important not to swallow the solution.  You can also use throat lozenges/cough drops and Chloraseptic spray to help with throat pain or discomfort.  Warm or cold liquids can also be helpful in relieving throat pain.  For headache, pain or general discomfort, you  can use Ibuprofen or Tylenol as directed.   Some authorities believe that zinc sprays or the use of Echinacea may shorten the course of your symptoms.   HOME CARE . Only take medications as instructed by your medical team. . Be sure to drink plenty of fluids. Water is fine as well as fruit juices, sodas and electrolyte beverages. You may want to stay away from caffeine or alcohol. If you are nauseated, try taking small sips of liquids. How do you know if you are getting enough fluid? Your urine should be a pale yellow or almost colorless. . Get rest. . Taking a steamy shower or using a humidifier may help nasal  congestion and ease sore throat pain. You can place a towel over your head and breathe in the steam from hot water coming from a faucet. . Using a saline nasal spray works much the same way. . Cough drops, hard candies and sore throat lozenges may ease your cough. . Avoid close contacts especially the very young and the elderly . Cover your mouth if you cough or sneeze . Always remember to wash your hands.   GET HELP RIGHT AWAY IF: . You develop worsening fever. . If your symptoms do not improve within 10 days . You develop yellow or green discharge from your nose over 3 days. . You have coughing fits . You develop a severe head ache or visual changes. . You develop shortness of breath, difficulty breathing or start having chest pain . Your symptoms persist after you have completed your treatment plan  MAKE SURE YOU   Understand these instructions.  Will watch your condition.  Will get help right away if you are not doing well or get worse.  Your e-visit answers were reviewed by a board certified advanced clinical practitioner to complete your personal care plan. Depending upon the condition, your plan could have included both over the counter or prescription medications. Please review your pharmacy choice. If there is a problem, you may call our nursing hot line at and have the prescription routed to another pharmacy. Your safety is important to [place]. If you have drug allergies check your prescription carefully.   You can use MyChart to ask questions about today's visit, request a non-urgent call back, or ask for a work or school excuse for 24 hours related to this e-Visit. If it has been greater than 24 hours you will need to follow up with your provider, or enter a new e-Visit to address those concerns. You will get an e-mail in the next two days asking about your experience.  I hope that your e-visit has been valuable and will speed your recovery. Thank you for using  e-visits.    Greater than 5 minutes, yet less than 10 minutes of time have been spent researching, coordinating, and implementing care for this patient today.

## 2020-08-14 ENCOUNTER — Other Ambulatory Visit: Payer: Self-pay

## 2020-09-02 ENCOUNTER — Other Ambulatory Visit: Payer: Self-pay

## 2020-09-02 MED FILL — Desvenlafaxine Succinate Tab ER 24HR 100 MG (Base Equiv): ORAL | 90 days supply | Qty: 90 | Fill #0 | Status: AC

## 2020-09-07 ENCOUNTER — Emergency Department: Payer: 59

## 2020-09-07 ENCOUNTER — Encounter: Payer: Self-pay | Admitting: Intensive Care

## 2020-09-07 ENCOUNTER — Emergency Department
Admission: EM | Admit: 2020-09-07 | Discharge: 2020-09-07 | Disposition: A | Discharge status: Home / Self Care | Payer: 59 | Attending: Emergency Medicine | Admitting: Emergency Medicine

## 2020-09-07 ENCOUNTER — Other Ambulatory Visit: Payer: Self-pay

## 2020-09-07 DIAGNOSIS — H9203 Otalgia, bilateral: Secondary | ICD-10-CM | POA: Diagnosis not present

## 2020-09-07 DIAGNOSIS — I10 Essential (primary) hypertension: Secondary | ICD-10-CM | POA: Insufficient documentation

## 2020-09-07 DIAGNOSIS — Z79899 Other long term (current) drug therapy: Secondary | ICD-10-CM | POA: Insufficient documentation

## 2020-09-07 DIAGNOSIS — Z20822 Contact with and (suspected) exposure to covid-19: Secondary | ICD-10-CM | POA: Diagnosis not present

## 2020-09-07 DIAGNOSIS — Z7982 Long term (current) use of aspirin: Secondary | ICD-10-CM | POA: Insufficient documentation

## 2020-09-07 DIAGNOSIS — Z7984 Long term (current) use of oral hypoglycemic drugs: Secondary | ICD-10-CM | POA: Insufficient documentation

## 2020-09-07 DIAGNOSIS — Z853 Personal history of malignant neoplasm of breast: Secondary | ICD-10-CM | POA: Diagnosis not present

## 2020-09-07 DIAGNOSIS — R42 Dizziness and giddiness: Secondary | ICD-10-CM | POA: Diagnosis not present

## 2020-09-07 DIAGNOSIS — Z87891 Personal history of nicotine dependence: Secondary | ICD-10-CM | POA: Diagnosis not present

## 2020-09-07 DIAGNOSIS — E119 Type 2 diabetes mellitus without complications: Secondary | ICD-10-CM | POA: Insufficient documentation

## 2020-09-07 LAB — RESP PANEL BY RT-PCR (FLU A&B, COVID) ARPGX2
Influenza A by PCR: NEGATIVE
Influenza B by PCR: NEGATIVE
SARS Coronavirus 2 by RT PCR: NEGATIVE

## 2020-09-07 MED ORDER — MECLIZINE HCL 25 MG PO TABS: 25.0000 mg | ORAL_TABLET | Freq: Three times a day (TID) | ORAL | 1 refills | Status: AC | PRN

## 2020-09-07 MED ORDER — MECLIZINE HCL 25 MG PO TABS
25.0000 mg | ORAL_TABLET | Freq: Once | ORAL | Status: AC
  Administered 2020-09-07: 25 mg via ORAL
  Filled 2020-09-07: qty 1

## 2020-09-07 MED ORDER — ONDANSETRON 4 MG PO TBDP: 4.0000 mg | ORAL_TABLET | Freq: Three times a day (TID) | ORAL | 1 refills | Status: AC | PRN

## 2020-09-07 NOTE — ED Provider Notes (Signed)
[facility] Emergency Department Provider Note  ____________________________________________   Event Date/Time   First MD Initiated Contact with Patient 09/07/20 1013     (approximate)  I have reviewed the triage vital signs and the nursing notes.   HISTORY  Chief Complaint Ear Pain   HPI Tracy Barnes is a 65 y.o. female presents to the ED with complaint of bilateral ear pain for greater than 1 year.  Patient states that for the last 3 months she has had a ringing in her ears.  She saw Dr. Tami Ribas years ago and was told to take an antihistamine, she continues to take Xyzal daily.  She denies any fever, chills but does endorse nausea, vomiting and diarrhea.  Patient states that in the past she has seen Dr. Tami Ribas who told her this would recur.  Patient states that most recently she has had 2 go to bed as she cannot deal with the dizziness which also increases her nausea and vomiting.  She rates her pain as an 8 out of 10.        Past Medical History:  Diagnosis Date  . Cancer ([place]) 02/19/2010   left breast  . Chest pain   . Diabetes mellitus   . Dyspnea on exertion   . GERD (gastroesophageal reflux disease)   . Hx of radiation therapy 06/18/10 to 08/01/10   L breast  . Hyperlipidemia   . Hypertension   . Personal history of chemotherapy 2011   LEFT lumpectomy w/ radiation and chemo  . Personal history of radiation therapy 2011   LEFT lumpectomy w/ radiation  . Sarcoidosis   . Thyroid disease     Patient Active Problem List   Diagnosis Date Noted  . Pulmonary sarcoidosis ([place]) 08/29/2015  . Prediabetes 08/26/2015  . Bibasilar crackles 06/25/2015  . Gastro-esophageal reflux disease without esophagitis 04/23/2015  . Essential hypertension 03/19/2014  . Hyperlipidemia 03/19/2014  . Lung nodule 01/08/2014  . Dyspnea 01/08/2014  . Mediastinal adenopathy 12/19/2013  . Nodule of right lung 12/10/2013  . Dyspnea on exertion 09/01/2013  . Chest  pain 09/01/2013  . Breast cancer, left breast ([place]) 05/01/2013  . Hx of radiation therapy   . Skin moles 09/19/2010  . Wears glasses/contacts 09/19/2010  . 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 Menopause 09/19/2010  . Hormone replacement therapy (postmenopausal) 09/19/2010  . PLANTAR FASCIITIS, BILATERAL 02/05/2009  . ABNORMALITY OF GAIT 02/05/2009    Past Surgical History:  Procedure Laterality Date  . ABDOMINAL HYSTERECTOMY     At 22 yrs of age. One ovary removed. Surgery due to fibroids per medical history form dated 02/25/10.  Marland Kitchen BREAST LUMPECTOMY Left 2010   LEFT lumpectomy w/ radiation  . BREAST SURGERY  03/25/2010   left lumpectomy  . DEEP AXILLARY SENTINEL NODE BIOPSY / EXCISION  03/25/2010   4/4 nodes negative  . MEDIASTINOSCOPY N/A 12/27/2013   Procedure: MEDIASTINOSCOPY;  Surgeon: Melrose Nakayama, MD;  Location: [facility];  Service: Thoracic;  Laterality: N/A;  . PORT-A-CATH REMOVAL  06/05/2011   Procedure: MINOR REMOVAL PORT-A-CATH;  Surgeon: Adin Hector, MD;  Location: [facility];  Service: General;  Laterality: N/A;  right  . PORTACATH PLACEMENT  04/18/2010   Dr. Fanny Skates  . VIDEO BRONCHOSCOPY WITH ENDOBRONCHIAL ULTRASOUND N/A 12/27/2013   Procedure: VIDEO BRONCHOSCOPY WITH ENDOBRONCHIAL ULTRASOUND;  Surgeon: Melrose Nakayama, MD;  Location: [facility];  Service: Thoracic;  Laterality: N/A;    Prior  to Admission medications   Medication Sig Start Date End Date Taking? Authorizing Provider  meclizine (ANTIVERT) 25 MG tablet Take 1 tablet (25 mg total) by mouth 3 (three) times daily as needed for dizziness. 09/07/20  Yes Letitia Neri L, PA-C  ondansetron (ZOFRAN ODT) 4 MG disintegrating tablet Take 1 tablet (4 mg total) by mouth every 8 (eight) hours as needed for nausea or vomiting. 09/07/20  Yes Johnn Hai, PA-C  ALPRAZolam Duanne Moron) 0.25 MG tablet Take 0.25 mg by mouth at bedtime as needed.    [provider]   ALPRAZolam Duanne Moron) 0.25 MG tablet Take 1 tablet by mouth twice a day as needed, caution with sedation 08/01/20     aspirin EC 81 MG tablet Take 81 mg by mouth daily.    [provider]  atorvastatin (LIPITOR) 20 MG tablet TAKE 1 TABLET BY MOUTH DAILY 08/01/20     clobetasol cream (TEMOVATE) 0.05 % APPLY TO AFFECTED AREAS OF THE SKIN ON CHEST TWICE DAILY FOR 2 WEEKS ON, THEN 2 WEEKS OFF 06/03/17   [provider]  desvenlafaxine (PRISTIQ) 100 MG 24 hr tablet Take 100 mg by mouth daily.    [provider]  desvenlafaxine (PRISTIQ) 100 MG 24 hr tablet TAKE 1 TABLET BY MOUTH ONCE DAILY 08/01/20     fluticasone (FLONASE) 50 MCG/ACT nasal spray Place 2 sprays into both nostrils daily. 03/24/18   Darlin Priestly, PA-C  hydrochlorothiazide (HYDRODIURIL) 12.5 MG tablet Take 12.5 mg by mouth daily.    [provider]  hydrochlorothiazide (MICROZIDE) 12.5 MG capsule TAKE 1 CAPSULE BY MOUTH DAILY 09/25/19 09/24/20  Reynold Bowen, MD  hydrochlorothiazide (MICROZIDE) 12.5 MG capsule TAKE 1 CAPSULE BY MOUTH DAILY 08/01/20     HYDROcodone-acetaminophen (NORCO/VICODIN) 5-325 MG tablet TAKE 1 TABLET BY MOUTH EVERY 4-6 HOURS AS NEEDED FOR PAIN FOR 5 DAYS. 03/25/20 09/21/20  Roseanne Kaufman, MD  ipratropium (ATROVENT) 0.03 % nasal spray Use 2 sprays in each nostril 2-3 times daily 08/12/20   Noe Gens, PA-C  levothyroxine (SYNTHROID) 100 MCG tablet TAKE 1 TABLET BY MOUTH ONCE DAILY 08/01/20     metFORMIN (GLUCOPHAGE) 500 MG tablet Take 500 mg by mouth 2 (two) times daily with a meal.     [provider]  metFORMIN (GLUCOPHAGE-XR) 500 MG 24 hr tablet TAKE 1 TABLET BY MOUTH EVERY MORNING AND 1 TABLET EVERY EVENING 08/01/20     TRUE METRIX BLOOD GLUCOSE TEST test strip  09/10/15   [provider]  TRUEPLUS LANCETS 30G [place]  09/10/15   [provider]  zolpidem (AMBIEN) 5 MG tablet TAKE 1 TABLET BY MOUTH AT BEDTIME AS NEEDED 08/09/20     pantoprazole (PROTONIX) 40 MG  tablet TAKE 1 TABLET BY MOUTH ONCE DAILY 04/23/15 06/27/20  Margarita Rana, MD    Allergies Patient has no known allergies.  Family History  Problem Relation Age of Onset  . Cancer Mother        Cancer of bladder and brain per medical history form dated 02/25/10.  Marland Kitchen Heart disease Father   . COPD Father   . Aortic stenosis Father   . Breast cancer Neg Hx     Social History Social History   Tobacco Use  . Smoking status: Former Smoker    Packs/day: 0.10    Years: 0.50    Pack years: 0.05    Types: Cigarettes    Quit date: 04/20/1977    Years since quitting: 43.4  . Smokeless tobacco: Never Used  Substance Use Topics  . Alcohol use: No  . Drug use: No    Review of Systems Constitutional: No fever/chills Eyes: No visual changes. ENT: No sore throat.  Positive ringing bilateral ears.  Positive bilateral ear pain. Cardiovascular: Denies chest pain. Respiratory: Denies shortness of breath. Gastrointestinal: No abdominal pain.  Positive nausea, positive vomiting.  Positive diarrhea.  Genitourinary: Negative for dysuria. Musculoskeletal: Negative for muscle aches. Skin: Negative for rash. Neurological: Negative for headaches, focal weakness or numbness. ____________________________________________   PHYSICAL EXAM:  VITAL SIGNS: ED Triage Vitals [09/07/20 1009]  Enc Vitals Group     BP      Pulse      Resp      Temp      Temp src      SpO2      Weight 180 lb (81.6 kg)     Height 5\' 3"  (1.6 m)     Head Circumference      Peak Flow      Pain Score 8     Pain Loc      Pain Edu?      Excl. in [place]?     Constitutional: Alert and oriented. Well appearing and in no acute distress. Eyes: Conjunctivae are normal. PERRL. EOMI. Head: Atraumatic. Nose: No congestion/rhinnorhea. Ears: EACs with minimal cerumen.  TMs are dull without injection or erythema.  Poor light reflex. Mouth/Throat: Mucous membranes are moist.  Oropharynx non-erythematous.  No exudate and uvula is  midline. Neck: No stridor.   Hematological/Lymphatic/Immunilogical: No cervical lymphadenopathy. Cardiovascular: Normal rate, regular rhythm. Grossly normal heart sounds.  Good peripheral circulation. Respiratory: Normal respiratory effort.  No retractions. Lungs CTAB. Musculoskeletal: Moves upper and lower extremities without any difficulty.  No edema noted lower extremities. Neurologic:  Normal speech and language. No gross focal neurologic deficits are appreciated.  Skin:  Skin is warm, dry and intact.  Psychiatric: Mood and affect are normal. Speech and behavior are normal.  ____________________________________________   LABS (all labs ordered are listed, but only abnormal results are displayed)  Labs Reviewed  RESP PANEL BY RT-PCR (FLU A&B, COVID) [id number]   ____________________________________________  RADIOLOGY Leana Gamer, personally viewed and evaluated these images (plain radiographs) as part of my medical decision making, as well as reviewing the written report by the radiologist.   Official radiology report(s): CT Head Wo Contrast  Result Date: 09/07/2020 CLINICAL DATA:  65 year old female with left side ear pain. Nausea and dizziness. EXAM: CT HEAD WITHOUT CONTRAST TECHNIQUE: Contiguous axial images were obtained from the base of the skull through the vertex without intravenous contrast. COMPARISON:  None. FINDINGS: Brain: No midline shift, ventriculomegaly, mass effect, evidence of mass lesion, intracranial hemorrhage or evidence of cortically based acute infarction. Gray-white matter differentiation is within normal limits throughout the brain. Cerebral volume is within normal limits for age. Vascular: Mild Calcified atherosclerosis at the skull base. No suspicious intracranial vascular hyperdensity. Skull: Negative. Sinuses/Orbits: Bilateral tympanic cavities and visible mastoids are clear. Visible paranasal sinuses are clear. External auditory canals appear negative  except for some superficial debris or cerumen on the left (series 3, image 6). Other: Visualized orbits and scalp soft tissues are within normal limits. IMPRESSION: 1. Normal for age non contrast CT appearance of the brain. 2. Some debris in the left external auditory canal. Middle ears and mastoids are clear. Electronically Signed   By: Genevie Ann M.D.   On: 09/07/2020 11:06    ____________________________________________   PROCEDURES  Procedure(s) performed (  including Critical Care):  Procedures   ____________________________________________   INITIAL IMPRESSION / ASSESSMENT AND PLAN / ED COURSE  As part of my medical decision making, I reviewed the following data within the electronic MEDICAL RECORD NUMBER Notes from prior ED visits and [place] Controlled Substance Database  65 year old female presents to the ED with complaint of dizziness, nausea and vomiting along with ringing in her ears.  She has seen Dr. Tami Ribas in the past and was told to take antihistamines which she has been doing daily.  She has an appointment to see [facility] but unable to get an appointment for a couple of months.  Because of her vomiting and diarrhea a respiratory swab was done and results were negative for influenza and COVID.  CT scan was reassuring and patient was made aware.  Prior to CT patient was given meclizine 25 mg and prior to discharge patient states that this is helping with her dizziness.  We discussed continuing the meclizine 3 times daily as needed and also Zofran.  She already is calling [facility] to see if there is any cancellations.  Patient will continue with her antihistamine as previously instructed and begin using Flonase nasal spray which she already has at home.  ____________________________________________   FINAL CLINICAL IMPRESSION(S) / ED DIAGNOSES  Final diagnoses:  Vertigo     ED Discharge Orders         Ordered    meclizine (ANTIVERT) 25 MG tablet  3 times daily PRN         09/07/20 1149    ondansetron (ZOFRAN ODT) 4 MG disintegrating tablet  Every 8 hours PRN        09/07/20 1149          *Please note:  Tracy Barnes was evaluated in Emergency Department on 09/07/2020 for the symptoms described in the history of present illness. She was evaluated in the context of the global COVID-19 pandemic, which necessitated consideration that the patient might be at risk for infection with the SARS-CoV-2 virus that causes COVID-19. Institutional protocols and algorithms that pertain to the evaluation of patients at risk for COVID-19 are in a state of rapid change based on information released by regulatory bodies including the CDC and federal and state organizations. These policies and algorithms were followed during the patient's care in the ED.  Some ED evaluations and interventions may be delayed as a result of limited staffing during and the pandemic.*   Note:  This document was prepared using Dragon voice recognition software and may include unintentional dictation errors.    Johnn Hai, PA-C 09/07/20 1500    Vladimir Crofts, MD 09/07/20 [phone]

## 2020-09-07 NOTE — ED Triage Notes (Signed)
C/o pain in bilateral ears and ringing. Also reports nausea

## 2020-09-07 NOTE — Discharge Instructions (Signed)
Keep your appointment with [facility].  A prescription for meclizine and Zofran was sent to your pharmacy.  The meclizine can be taken 3 times a day as needed for dizziness.  Zofran only if needed for nausea.  Continue using your Flonase nasal spray daily.  You can also add an antihistamine to see if this helps reduce the ringing in your ears until you are seen by the ear specialist.  Drink lots of fluids.

## 2020-09-15 ENCOUNTER — Other Ambulatory Visit: Payer: Self-pay

## 2020-09-17 ENCOUNTER — Other Ambulatory Visit: Payer: Self-pay

## 2020-09-17 MED ORDER — ZOLPIDEM TARTRATE 5 MG PO TABS
5.0000 mg | ORAL_TABLET | Freq: Every evening | ORAL | 0 refills | Status: DC | PRN
  Filled 2020-09-17: qty 30, 30d supply, fill #0

## 2020-09-19 ENCOUNTER — Other Ambulatory Visit: Payer: Self-pay

## 2020-09-19 MED ORDER — ALPRAZOLAM 0.25 MG PO TABS
ORAL_TABLET | ORAL | 3 refills | Status: DC
  Filled 2020-09-19: qty 60, 30d supply, fill #0
  Filled 2020-11-06: qty 60, 30d supply, fill #1
  Filled 2021-01-06: qty 60, 30d supply, fill #2
  Filled 2021-02-19: qty 60, 30d supply, fill #3

## 2020-09-20 ENCOUNTER — Other Ambulatory Visit: Payer: Self-pay

## 2020-09-24 ENCOUNTER — Encounter: Payer: Self-pay | Admitting: Physical Therapy

## 2020-09-24 ENCOUNTER — Other Ambulatory Visit: Payer: Self-pay

## 2020-09-24 ENCOUNTER — Ambulatory Visit: Payer: 59 | Attending: Endocrinology | Admitting: Physical Therapy

## 2020-09-24 DIAGNOSIS — R42 Dizziness and giddiness: Secondary | ICD-10-CM | POA: Insufficient documentation

## 2020-09-24 NOTE — Therapy (Signed)
[place] [facility] SERVICES [address] [place], [place], [address] Phone: [phone]   Fax:  [phone]  Physical Therapy Evaluation  Patient Details  Name: Tracy Barnes MRN: [id number] Date of Birth: [birth year] Referring Provider (PT): Dr. Reynold Bowen   Encounter Date: 09/24/2020   PT End of Session - 09/24/20 1303    Visit Number 1    Number of Visits 9    Date for PT Re-Evaluation 11/26/20    PT Start Time 1027    PT Stop Time 1412    PT Time Calculation (min) 69 min    Equipment Utilized During Treatment Gait belt    Activity Tolerance Patient tolerated treatment well    Behavior During Therapy [facility] for tasks assessed/performed           Past Medical History:  Diagnosis Date  . Cancer ([place]) 02/19/2010   left breast  . Chest pain   . Diabetes mellitus   . Dyspnea on exertion   . GERD (gastroesophageal reflux disease)   . Hx of radiation therapy 06/18/10 to 08/01/10   L breast  . Hyperlipidemia   . Hypertension   . Personal history of chemotherapy 2011   LEFT lumpectomy w/ radiation and chemo  . Personal history of radiation therapy 2011   LEFT lumpectomy w/ radiation  . Sarcoidosis   . Thyroid disease     Past Surgical History:  Procedure Laterality Date  . ABDOMINAL HYSTERECTOMY     At 57 yrs of age. One ovary removed. Surgery due to fibroids per medical history form dated 02/25/10.  Marland Kitchen BREAST LUMPECTOMY Left 2010   LEFT lumpectomy w/ radiation  . BREAST SURGERY  03/25/2010   left lumpectomy  . DEEP AXILLARY SENTINEL NODE BIOPSY / EXCISION  03/25/2010   4/4 nodes negative  . MEDIASTINOSCOPY N/A 12/27/2013   Procedure: MEDIASTINOSCOPY;  Surgeon: Melrose Nakayama, MD;  Location: [facility];  Service: Thoracic;  Laterality: N/A;  . PORT-A-CATH REMOVAL  06/05/2011   Procedure: MINOR REMOVAL PORT-A-CATH;  Surgeon: Adin Hector, MD;  Location: [facility];  Service: General;  Laterality: N/A;   right  . PORTACATH PLACEMENT  04/18/2010   Dr. Fanny Skates  . VIDEO BRONCHOSCOPY WITH ENDOBRONCHIAL ULTRASOUND N/A 12/27/2013   Procedure: VIDEO BRONCHOSCOPY WITH ENDOBRONCHIAL ULTRASOUND;  Surgeon: Melrose Nakayama, MD;  Location: [facility];  Service: Thoracic;  Laterality: N/A;    There were no vitals filed for this visit.    Subjective Assessment - 09/24/20 1656    Subjective Patient reports that when she has an active episode of dizziness and vertigo, it is severe and she can hardly get out of bed for about 3 days until it subsides.  Patient reports that she has been taking meclizine which she feels has been helping her dizziness symptoms.    Pertinent History History obtained from patient and medical record.  Patient reports that she first began having episodes of dizziness when she was 65 years old.  Patient was seen in the ED on 09/07/2020 due to tinnitus, dizziness with nausea and vomiting.  Patient reports she was prescribed meclizine in the ER, and she reports she has continued to take meclizine after discharge which she states helps with her dizziness symptoms.  Patient describes her dizziness symptoms as vertigo, unsteadiness, lightheadedness, aural fullness and a jittery feeling. Patient reports that quick turns, head turns bring on the dizziness as well as if she is stationary and objects are  moving around her.  Patient reports that Flonase, Meclizine and staying still will help to decrease her dizziness symptoms.  Patient reports that she has had 4 episodes of dizziness in the past 6 months.  Patient reports that when she has an episode of dizziness it usually lasts about 3 days and that she will be very dizzy with nausea and vomiting. Patient reports that she will usually stay in bed when she has these episodes until her dizziness subsides. Patient states she takes Advil and Flonase for these episodes of dizziness as well.  Patient's dizziness symptoms are motion provoked, spontaneous and  intermittent.  Patient reports that she feels that it is always her left ear but gives her difficulty and that she feels pressure and fullness in her left ear.  Patient reports she saw Dr. Tami Ribas, ENT physician, in 2018 in regards to her dizziness and ear symptoms.  Patient describes that she had canal testing which was negative per patient report.  Patient states she has been taking Flonase and antihistamines to help with her dizziness symptoms. Patient reports that she usually sleeps on several pillows (about 30 degree elevation) because patient states she will get dizziness if she lies flat.    Diagnostic tests CT head on 09/07/2020:IMPRESSION:  1. Normal for age non contrast CT appearance of the brain.  2. Some debris in the left external auditory canal. Middle ears and  mastoids are clear.    Patient Stated Goals To have decreased dizziness symptoms; to be able to turn her head without dizziness    Currently in Pain? 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 Reynold Bowen    Prior Therapy no prior vestibular therapy      Precautions   Precautions Fall      Restrictions   Weight Bearing Restrictions No      Balance Screen   Has the patient fallen in the past 6 months Yes    How many times? 1    Has the patient had a decrease in activity level because of a fear of falling?  Yes    Is the patient reluctant to leave their home because of a fear of falling?  Yes      [place] residence    Living Arrangements Alone    Available Help at Discharge Family;Friend(s)    Type of [place] to enter    Entrance Stairs-Number of Steps 3 steps    Entrance Stairs-Rails Right;Left;Can reach both   at back deck which is primary way she accesses the [place] One level      Prior Function   Level of Independence  Independent with community mobility without device    Vocation Full time employment   works from home     Cognition   Overall Cognitive Status Within Functional Limits for tasks assessed      Standardized Balance Assessment   Standardized Balance Assessment Dynamic Gait Index      Dynamic Gait Index   Level Surface Normal    Change in Gait Speed Normal    Gait with Horizontal Head Turns Normal    Gait with Vertical Head Turns Moderate Impairment    Gait and Pivot Turn Normal  reports unsteadiness with both directions   Step Over Obstacle Normal    Step Around Obstacles Normal    Steps Mild Impairment    Total Score 21            VESTIBULAR AND BALANCE EVALUATION  HISTORY:  Subjective history of current problem: History obtained from patient and medical record.  Patient reports that she first began having episodes of dizziness when she was 65 years old.  Patient was seen in the ED on 09/07/2020 due to tinnitus, dizziness with nausea and vomiting.  Patient reports she was prescribed meclizine in the ER, and she reports she has continued to take meclizine after discharge which she states helps with her dizziness symptoms.  Patient describes her dizziness symptoms as vertigo, unsteadiness, lightheadedness, aural fullness and a jittery feeling. Patient reports that quick turns, head turns bring on the dizziness as well as if she is stationary and objects are moving around her.  Patient reports that Flonase, Meclizine and staying still will help to decrease her dizziness symptoms.  Patient reports that she has had 4 episodes of dizziness in the past 6 months.  Patient reports that when she has an episode of dizziness it usually lasts about 3 days and that she will be very dizzy with nausea and vomiting. Patient reports that she will usually stay in bed when she has these episodes until her dizziness subsides. Patient states she takes Advil and Flonase for these episodes of dizziness as well.   Patient's dizziness symptoms are motion provoked, spontaneous and intermittent.  Patient reports that she feels that it is always her left ear that gives her difficulty and that she feels pressure and fullness in her left ear.  Patient reports she saw Dr. Tami Ribas, ENT physician, in 2018 in regards to her dizziness and ear symptoms.  Patient describes that she had canal testing which was negative per patient report.  Patient states she has been taking Flonase and antihistamines to help with her dizziness symptoms. Patient reports that she usually sleeps on several pillows (about 30 degree elevation) because patient states she will get dizziness if she lies flat.   Description of dizziness: vertigo, unsteadiness, lightheadedness, aural fullness and jittery feeling.  Frequency: 4 episodes in the last 6 months Duration: usually lasts about 3 days Symptom nature: motion provoked, spontaneous, intermittent  Progression of symptoms: same intensity, but increased frequency of episodes History of similar episodes: yes  Falls (yes/no): yes Number of falls in past 6 months: 1 mechanical trip over a toy on the ground.  Auditory complaints (tinnitus, pain, drainage): reports chronic tinnitus for past year and then reports she also gets pain in the ear which coincides with tinnitus and dizziness.  Vision (last eye exam, diplopia, recent changes): denies; patient wears contacts; patient reports has one eye with cataracts that they are monitoring.   Current Symptoms: (dysarthria, dysphagia, drop attacks, bowel and bladder changes, recent weight loss/gain)  Review of systems negative for red flags.    EXAMINATION  COORDINATION: Finger to Nose: deferred  MUSCULOSKELETAL SCREEN: Cervical Spine ROM: AROM cervical spine WFL flexion, extension, right and left rotation  Gait: Patient arrives ambulating without AD. Patient ambulates with fair cadence with reciprocal arm swing with fair scanning of visual  environment.  Balance: Patient is challenged by uneven surfaces, narrow base of support, eyes closed, activities with head turns and body turns.   POSTURAL CONTROL TESTS:  Clinical Test of Sensory Interaction for Balance (CTSIB):  CONDITION TIME STRATEGY SWAY  Eyes open, firm surface 30 seconds ankle +1  Eyes closed, firm surface 30 seconds ankle +2  Eyes open, foam surface 30 seconds ankle +2  Eyes closed, foam surface 30 seconds ankle +3    OCULOMOTOR / VESTIBULAR TESTING: Oculomotor Exam- Room Light  Normal Abnormal Comments  Ocular Alignment N    Ocular ROM N    Spontaneous Nystagmus N    Gaze evoked Nystagmus  Abn Right lateral end gaze nystagmus  Smooth Pursuit  Abn 6/10 dizziness a few saccadic movements noted  Saccades  Abn Mild hypometric saccades noted  VOR  Abn dizziness  VOR Cancellation N    Left Head Impulse   Noted one corrective saccade with left head impulse testing  Right Head Impulse N    Head Shaking Nystagmus  Abn 8/10 dizziness no nystagmus observed   NOTE: Patient reports she took meclizine prior to session which might have impacted test results.    Symptoms Duration Intensity Nystagmus  Left Dix-Hallpike Dizziness, but denies vertigo Less than 60 sec 7/10 None observed  Right Dix-Hallpike Dizziness, but denies vertigo Less than 45 sec 7/10 None observed  Left Head Roll None N/A N/A None observed  Right Head Roll None N/A N/A None observed   NOTE: Patient reports she took meclizine prior to session which might have impacted test results.   FUNCTIONAL OUTCOME MEASURES:  Results Comments  DHI 24/100 low perception of handicap  ABC Scale 84.6% Decreased falls risk  DGI 21/24 Mild impairment; in need of intervention  FOTO 52/100 Given the patient's risk adjustment variables, like-patients nationally had a FS score of  51/100 at intake   VOR X 1 exercise:  Demonstrated and educated as to VOR X1.  Patient performed VOR X 1 horizontal in sitting 1 rep  of 30 second with verbal cues for technique.  Patient reports  /10 dizziness.  Issued for HEP.      PT Education - 09/24/20 1303    Education Details Discussed plan of care; issued VOR X 1 for HEP vestibular slide #2 handout provided;  Encouraged patient to follow-up with her ENT physician in regards to her dizziness symptoms.    Person(s) Educated Patient    Methods Explanation;Handout;Demonstration;Verbal cues    Comprehension Verbalized understanding;Returned demonstration            PT Short Term Goals - 09/24/20 1557      PT SHORT TERM GOAL #1   Title Pt will be independent with HEP in order to improve balance and decrease dizziness symptoms in order to decrease fall risk and improve function at home and work.    Time 4    Period Weeks    Status New    Target Date 10/22/20             PT Long Term Goals - 09/24/20 1556      PT LONG TERM GOAL #1   Title Patient will have improved FOTO score of 5 points or greater in order to demonstrate improvements in patient's ADLs and functional performance.    Baseline scored 52/100 on 09/24/20;    Time 8    Period Weeks    Status New    Target Date 11/26/20      PT LONG TERM GOAL #2   Title Patient will report 50% or greater improvement in her symptoms of dizziness and imbalance with provoking motions or positions.    Time 8    Period Weeks    Status New  Target Date 11/26/20      PT LONG TERM GOAL #3   Title Patient will be able to perform quick turns and body turns with decreased dizzness rated 2/10 or less in order to be able to demonstrate decreased symptoms with provoking movements to better be able to do ADLs and IADLs.    Time 8    Period Weeks    Status New    Target Date 11/26/20                  Plan - 09/25/20 1628    Clinical Impression Statement Patient presents with report of intermittent episodes of significant dizziness that lasts for days since patient was 65 years old, and reports she has had  4 episodes in the past 6 months. Patient with potential indicators of peripheral and central signs with oculomotor testing. Patient reports dizziness without vertigo in the left and right Dix-Hallpike positions and no nystagmus was observed. However, patient reports that she had taken Meclizine earlier this date which might have impacted test results. Patient scored 52/100 on the FOTO outcome measure indicating some difficulty with functional daily tasks. Patient would benefit from Vestibular PT services to address functional deficits and goals as set on plan of care.    Personal Factors and Comorbidities Time since onset of injury/illness/exacerbation;Comorbidity 3+    Comorbidities hypothyroidism, DM, HTN, hyperlipidemia, breast CA s/p radiation therapy    Examination-Activity Limitations Other   head turns and quick turns   Stability/Clinical Decision Making Evolving/Moderate complexity    Clinical Decision Making Moderate    Rehab Potential Good    PT Frequency 1x / week    PT Duration 8 weeks    PT Treatment/Interventions Canalith Repostioning;Gait training;Stair training;Therapeutic activities;Therapeutic exercise;Balance training;Neuromuscular re-education;Patient/family education;Vestibular    PT Next Visit Plan review and progress VOR X1; IR goggles to assess eye movement and with Dix-Hallpike tests, FT and semi-tandem progressions with head turns    PT Home Exercise Plan VOR X 1 in sitting horizontal    Consulted and Agree with Plan of Care Patient           Patient will benefit from skilled therapeutic intervention in order to improve the following deficits and impairments:  Decreased balance,Dizziness  Visit Diagnosis: Dizziness and giddiness     Problem List Patient Active Problem List   Diagnosis Date Noted  . Pulmonary sarcoidosis ([place]) 08/29/2015  . Prediabetes 08/26/2015  . Bibasilar crackles 06/25/2015  . Gastro-esophageal reflux disease without esophagitis  04/23/2015  . Essential hypertension 03/19/2014  . Hyperlipidemia 03/19/2014  . Lung nodule 01/08/2014  . Dyspnea 01/08/2014  . Mediastinal adenopathy 12/19/2013  . Nodule of right lung 12/10/2013  . Dyspnea on exertion 09/01/2013  . Chest pain 09/01/2013  . Breast cancer, left breast ([place]) 05/01/2013  . Hx of radiation therapy   . Skin moles 09/19/2010  . Wears glasses/contacts 09/19/2010  . 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 Menopause 09/19/2010  . Hormone replacement therapy (postmenopausal) 09/19/2010  . PLANTAR FASCIITIS, BILATERAL 02/05/2009  . ABNORMALITY OF GAIT 02/05/2009   Lady Deutscher PT, DPT [phone] Lady Deutscher 09/25/2020, 4:56 PM  [place] [facility] SERVICES [address] [place], [place], [address] Phone: [phone]   Fax:  [phone]  Name: Tracy Barnes MRN: [id number] Date of Birth: [birth year]

## 2020-09-26 ENCOUNTER — Other Ambulatory Visit: Payer: Self-pay

## 2020-09-30 ENCOUNTER — Ambulatory Visit: Payer: 59 | Admitting: Physical Therapy

## 2020-10-07 ENCOUNTER — Ambulatory Visit: Payer: 59 | Admitting: Physical Therapy

## 2020-10-07 ENCOUNTER — Other Ambulatory Visit: Payer: Self-pay

## 2020-10-07 ENCOUNTER — Encounter: Payer: Self-pay | Admitting: Physical Therapy

## 2020-10-07 DIAGNOSIS — R42 Dizziness and giddiness: Secondary | ICD-10-CM

## 2020-10-07 NOTE — Therapy (Signed)
[place] MAIN [facility] SERVICES [address] [place], [place], [address] Phone: [phone]   Fax:  [phone]  Physical Therapy Treatment  Patient Details  Name: Tracy Barnes MRN: [id number] Date of Birth: 07-Sep-1955 Referring Provider (PT): Dr. Reynold Bowen   Encounter Date: 10/07/2020   PT End of Session - 10/07/20 1540     Visit Number 2    Number of Visits 9    Date for PT Re-Evaluation 11/26/20    PT Start Time 1435    PT Stop Time 6606    PT Time Calculation (min) 36 min    Activity Tolerance Patient tolerated treatment well    Behavior During Therapy [facility] for tasks assessed/performed             Past Medical History:  Diagnosis Date   Cancer ([place]) 02/19/2010   left breast   Chest pain    Diabetes mellitus    Dyspnea on exertion    GERD (gastroesophageal reflux disease)    Hx of radiation therapy 06/18/10 to 08/01/10   L breast   Hyperlipidemia    Hypertension    Personal history of chemotherapy 2011   LEFT lumpectomy w/ radiation and chemo   Personal history of radiation therapy 2011   LEFT lumpectomy w/ radiation   Sarcoidosis    Thyroid disease     Past Surgical History:  Procedure Laterality Date   ABDOMINAL HYSTERECTOMY     At 65 yrs of age. One ovary removed. Surgery due to fibroids per medical history form dated 02/25/10.   BREAST LUMPECTOMY Left 2010   LEFT lumpectomy w/ radiation   BREAST SURGERY  03/25/2010   left lumpectomy   DEEP AXILLARY SENTINEL NODE BIOPSY / EXCISION  03/25/2010   4/4 nodes negative   MEDIASTINOSCOPY N/A 12/27/2013   Procedure: MEDIASTINOSCOPY;  Surgeon: Melrose Nakayama, MD;  Location: [place];  Service: Thoracic;  Laterality: N/A;   PORT-A-CATH REMOVAL  06/05/2011   Procedure: MINOR REMOVAL PORT-A-CATH;  Surgeon: Adin Hector, MD;  Location: [place];  Service: General;  Laterality: N/A;  right   PORTACATH PLACEMENT  04/18/2010   Dr. Fanny Skates    VIDEO BRONCHOSCOPY WITH ENDOBRONCHIAL ULTRASOUND N/A 12/27/2013   Procedure: VIDEO BRONCHOSCOPY WITH ENDOBRONCHIAL ULTRASOUND;  Surgeon: Melrose Nakayama, MD;  Location: [place];  Service: Thoracic;  Laterality: N/A;    There were no vitals filed for this visit.   Subjective Assessment - 10/07/20 1427     Subjective Patient reports that she has not had any dizziness this past week and states she goes to see Dr. Tami Ribas tomorrow. Patient states she has been doing the VOR X 1 and doing 16-20 head turns per rep.    Pertinent History History obtained from patient and medical record.  Patient reports that she first began having episodes of dizziness when she was 65 years old.  Patient was seen in the ED on 09/07/2020 due to tinnitus, dizziness with nausea and vomiting.  Patient reports she was prescribed meclizine in the ER, and she reports she has continued to take meclizine after discharge which she states helps with her dizziness symptoms.  Patient describes her dizziness symptoms as vertigo, unsteadiness, lightheadedness, aural fullness and a jittery feeling. Patient reports that quick turns, head turns bring on the dizziness as well as if she is stationary and objects are moving around her.  Patient reports that Flonase, Meclizine and staying still will help to decrease her  dizziness symptoms.  Patient reports that she has had 4 episodes of dizziness in the past 6 months.  Patient reports that when she has an episode of dizziness it usually lasts about 3 days and that she will be very dizzy with nausea and vomiting. Patient reports that she will usually stay in bed when she has these episodes until her dizziness subsides. Patient states she takes Advil and Flonase for these episodes of dizziness as well.  Patient's dizziness symptoms are motion provoked, spontaneous and intermittent.  Patient reports that she feels that it is always her left ear but gives her difficulty and that she feels pressure and  fullness in her left ear.  Patient reports she saw Dr. Tami Ribas, ENT physician, in 2018 in regards to her dizziness and ear symptoms.  Patient describes that she had canal testing which was negative per patient report.  Patient states she has been taking Flonase and antihistamines to help with her dizziness symptoms. Patient reports that she usually sleeps on several pillows (about 30 degree elevation) because patient states she will get dizziness if she lies flat.    Diagnostic tests CT head on 09/07/2020:IMPRESSION:  1. Normal for age non contrast CT appearance of the brain.  2. Some debris in the left external auditory canal. Middle ears and  mastoids are clear.    Patient Stated Goals To have decreased dizziness symptoms; to be able to turn her head without dizziness             Neuromuscular Re-education:  VOR x 1 exercise: Patient performed VOR X 1 horizontal in standing 1 rep of 30 seconds on firm surface and then 2 reps of 1 minute with verbal cue initially for technique.  Patient reports 7/10 dizziness. Added progression of performing 1 minute reps in standing.   Airex pad:  On Airex pad, patient performed feet together progressions and semi-tandem progressions with alternating lead leg with and without body turns and horizontal and vertical head turns with CGA multiple 1 minute reps of each.  Patient demonstrates mild imbalance with these activities. Added above exercises to home exercise program.  Ambulation with head turns:  Patient performed 175' forwards ambulation while scanning for targets in hallway with CGA with cuing to move head to scan for targets instead of just eyes.  Patient performed 43' trials of forwards ambulation with horizontal and then 68' ambulation with vertical head turns with CGA.   Patient reports mild dizziness and imbalance with ambulation with vertical head turns.   After Neuromuscular re-education, performed Dix-Hallpike testing and then canalith  repositioning maneuver.  Canalith Repositioning Maneuver: On inverted mat table, performed left Dix-Hallpike testing and it was potentially positive patient reporting dizziness but denies vertigo and no nystagmus observed.  On inverted mat table performed right Dix-Hallpike testing and it was potentially positive patient reporting 7/10 dizziness but denies vertigo and no nystagmus observed. Performed right canalith repositioning maneuver (CRT). Repeated Right CRT for a total of 3 maneuvers with retesting between maneuvers. Patient reported 7/10 with first, 5-6/10 with second and 0/10 with third maneuver.         PT Education - 10/07/20 1537     Education Details reviewed VOR X 1 and added feet together and semi-tandem progressions with body turns and head turns horizontal and vertical to HEP-handout provided; discussed BPPV and provided handout from [email or web address].org    Person(s) Educated Patient    Methods Explanation;Verbal cues;Handout;Demonstration    Comprehension Verbalized understanding;Returned demonstration;Verbal cues required  PT Short Term Goals - 09/24/20 1557       PT SHORT TERM GOAL #1   Title Pt will be independent with HEP in order to improve balance and decrease dizziness symptoms in order to decrease fall risk and improve function at home and work.    Time 4    Period Weeks    Status New    Target Date 10/22/20               PT Long Term Goals - 09/24/20 1556       PT LONG TERM GOAL #1   Title Patient will have improved FOTO score of 5 points or greater in order to demonstrate improvements in patient's ADLs and functional performance.    Baseline scored 52/100 on 09/24/20;    Time 8    Period Weeks    Status New    Target Date 11/26/20      PT LONG TERM GOAL #2   Title Patient will report 50% or greater improvement in her symptoms of dizziness and imbalance with provoking motions or positions.    Time 8    Period Weeks    Status New     Target Date 11/26/20      PT LONG TERM GOAL #3   Title Patient will be able to perform quick turns and body turns with decreased dizzness rated 2/10 or less in order to be able to demonstrate decreased symptoms with provoking movements to better be able to do ADLs and IADLs.    Time 8    Period Weeks    Status New    Target Date 11/26/20                   Plan - 10/07/20 1553     Clinical Impression Statement Patient reports that she has not had dizziness this past week and that she has been trying to do the VOR X1 exercise. Patient able to progress to standing VOR X 1 for 1 minute reps this date. Retested Dix-Hallpike tests with IR goggles donned and patient reported 7/10 dizziness without nystagmus noted in right test position. Therefore, performed Epley maneuvers and patient reported 0/10 dizziness on third maneuver. Patient challenged by activities on Airex pad, the feet together and semi-tandem progressions with head and body turns. Added these exercises to home exercise program. Will plan on reviewing HEP next visit and working on progressions of vestibular exercises. Patient would benefit from continued PT services to further address goals and subjective symptoms of dizziness.    Personal Factors and Comorbidities Time since onset of injury/illness/exacerbation;Comorbidity 3+    Comorbidities hypothyroidism, DM, HTN, hyperlipidemia, breast CA s/p radiation therapy    Examination-Activity Limitations Other   head turns and quick turns   Stability/Clinical Decision Making Evolving/Moderate complexity    Rehab Potential Good    PT Frequency 1x / week    PT Duration 8 weeks    PT Treatment/Interventions Canalith Repostioning;Gait training;Stair training;Therapeutic activities;Therapeutic exercise;Balance training;Neuromuscular re-education;Patient/family education;Vestibular    PT Next Visit Plan review and progress VOR X1; IR goggles to assess eye movement and with Dix-Hallpike  tests, FT and semi-tandem progressions with head turns    PT Home Exercise Plan VOR X 1 in sitting horizontal    Consulted and Agree with Plan of Care Patient             Patient will benefit from skilled therapeutic intervention in order to improve the following deficits and impairments:  Decreased balance, Dizziness  Visit Diagnosis: Dizziness and giddiness     Problem List Patient Active Problem List   Diagnosis Date Noted   Pulmonary sarcoidosis ([place]) 08/29/2015   Prediabetes 08/26/2015   Bibasilar crackles 06/25/2015   Gastro-esophageal reflux disease without esophagitis 04/23/2015   Essential hypertension 03/19/2014   Hyperlipidemia 03/19/2014   Lung nodule 01/08/2014   Dyspnea 01/08/2014   Mediastinal adenopathy 12/19/2013   Nodule of right lung 12/10/2013   Dyspnea on exertion 09/01/2013   Chest pain 09/01/2013   Breast cancer, left breast ([place]) 05/01/2013   Hx of radiation therapy    Skin moles 09/19/2010   Wears glasses/contacts 09/19/2010   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

## 2020-10-08 DIAGNOSIS — R42 Dizziness and giddiness: Secondary | ICD-10-CM | POA: Diagnosis not present

## 2020-10-08 DIAGNOSIS — H9313 Tinnitus, bilateral: Secondary | ICD-10-CM | POA: Diagnosis not present

## 2020-10-08 DIAGNOSIS — H903 Sensorineural hearing loss, bilateral: Secondary | ICD-10-CM | POA: Diagnosis not present

## 2020-10-17 DIAGNOSIS — E785 Hyperlipidemia, unspecified: Secondary | ICD-10-CM | POA: Diagnosis not present

## 2020-10-17 DIAGNOSIS — E119 Type 2 diabetes mellitus without complications: Secondary | ICD-10-CM | POA: Diagnosis not present

## 2020-10-17 DIAGNOSIS — D860 Sarcoidosis of lung: Secondary | ICD-10-CM | POA: Diagnosis not present

## 2020-10-17 DIAGNOSIS — E039 Hypothyroidism, unspecified: Secondary | ICD-10-CM | POA: Diagnosis not present

## 2020-10-17 DIAGNOSIS — I700 Atherosclerosis of aorta: Secondary | ICD-10-CM | POA: Diagnosis not present

## 2020-10-17 DIAGNOSIS — I2584 Coronary atherosclerosis due to calcified coronary lesion: Secondary | ICD-10-CM | POA: Diagnosis not present

## 2020-10-17 DIAGNOSIS — C50919 Malignant neoplasm of unspecified site of unspecified female breast: Secondary | ICD-10-CM | POA: Diagnosis not present

## 2020-10-17 DIAGNOSIS — F329 Major depressive disorder, single episode, unspecified: Secondary | ICD-10-CM | POA: Diagnosis not present

## 2020-10-17 DIAGNOSIS — I10 Essential (primary) hypertension: Secondary | ICD-10-CM | POA: Diagnosis not present

## 2020-10-21 ENCOUNTER — Other Ambulatory Visit: Payer: Self-pay

## 2020-10-22 ENCOUNTER — Other Ambulatory Visit: Payer: Self-pay

## 2020-10-22 ENCOUNTER — Telehealth: Payer: Self-pay | Admitting: *Deleted

## 2020-10-22 MED ORDER — ZOLPIDEM TARTRATE 5 MG PO TABS
5.0000 mg | ORAL_TABLET | Freq: Every evening | ORAL | 0 refills | Status: DC | PRN
  Filled 2020-10-22: qty 30, 30d supply, fill #0

## 2020-10-22 NOTE — Telephone Encounter (Signed)
Received call from pt stating recent lab work from PCP showed neut % 73.6.  pt concerned with hx of breast cancer.  Lab result reviewed with MD and per MD this is WNL and no concern for breast cancer re-occurrence at this time.  States pt should request PCP to re draw labs in 3 months to re-assess.   Pt appreciative of advice.

## 2020-10-24 ENCOUNTER — Other Ambulatory Visit: Payer: Self-pay

## 2020-10-29 ENCOUNTER — Ambulatory Visit: Payer: 59 | Admitting: Physical Therapy

## 2020-11-05 ENCOUNTER — Encounter: Payer: 59 | Admitting: Physical Therapy

## 2020-11-06 ENCOUNTER — Other Ambulatory Visit: Payer: Self-pay

## 2020-11-07 ENCOUNTER — Other Ambulatory Visit: Payer: Self-pay

## 2020-11-12 ENCOUNTER — Encounter: Payer: 59 | Admitting: Physical Therapy

## 2020-11-15 ENCOUNTER — Other Ambulatory Visit: Payer: Self-pay

## 2020-11-15 MED ORDER — ZOLPIDEM TARTRATE 5 MG PO TABS
5.0000 mg | ORAL_TABLET | Freq: Every evening | ORAL | 0 refills | Status: DC | PRN
  Filled 2020-11-15: qty 30, 30d supply, fill #0

## 2020-11-19 ENCOUNTER — Encounter: Payer: 59 | Admitting: Physical Therapy

## 2020-11-25 DIAGNOSIS — Z6833 Body mass index (BMI) 33.0-33.9, adult: Secondary | ICD-10-CM | POA: Diagnosis not present

## 2020-11-25 DIAGNOSIS — M8588 Other specified disorders of bone density and structure, other site: Secondary | ICD-10-CM | POA: Diagnosis not present

## 2020-11-25 DIAGNOSIS — N958 Other specified menopausal and perimenopausal disorders: Secondary | ICD-10-CM | POA: Diagnosis not present

## 2020-11-25 DIAGNOSIS — Z01419 Encounter for gynecological examination (general) (routine) without abnormal findings: Secondary | ICD-10-CM | POA: Diagnosis not present

## 2020-12-03 ENCOUNTER — Encounter: Payer: 59 | Admitting: Physical Therapy

## 2020-12-10 ENCOUNTER — Other Ambulatory Visit: Payer: Self-pay

## 2020-12-20 ENCOUNTER — Other Ambulatory Visit: Payer: Self-pay

## 2020-12-20 MED ORDER — ZOLPIDEM TARTRATE 5 MG PO TABS
5.0000 mg | ORAL_TABLET | Freq: Every evening | ORAL | 0 refills | Status: DC | PRN
  Filled 2020-12-20: qty 30, 30d supply, fill #0

## 2021-01-06 ENCOUNTER — Other Ambulatory Visit: Payer: Self-pay

## 2021-01-08 ENCOUNTER — Other Ambulatory Visit: Payer: Self-pay

## 2021-01-08 MED ORDER — HYDROCHLOROTHIAZIDE 25 MG PO TABS
ORAL_TABLET | ORAL | 3 refills | Status: DC
  Filled 2021-01-08: qty 90, 90d supply, fill #0
  Filled 2021-04-23: qty 90, 90d supply, fill #1
  Filled 2021-07-21: qty 90, 90d supply, fill #2
  Filled 2021-10-17: qty 90, 90d supply, fill #3

## 2021-01-21 ENCOUNTER — Other Ambulatory Visit: Payer: Self-pay

## 2021-01-22 ENCOUNTER — Other Ambulatory Visit: Payer: Self-pay

## 2021-01-22 MED ORDER — ZOLPIDEM TARTRATE 5 MG PO TABS
5.0000 mg | ORAL_TABLET | Freq: Every evening | ORAL | 0 refills | Status: DC | PRN
  Filled 2021-01-22: qty 30, 30d supply, fill #0

## 2021-01-24 ENCOUNTER — Other Ambulatory Visit: Payer: Self-pay

## 2021-02-12 ENCOUNTER — Other Ambulatory Visit: Payer: Self-pay

## 2021-02-19 ENCOUNTER — Other Ambulatory Visit: Payer: Self-pay

## 2021-02-19 MED ORDER — ZOLPIDEM TARTRATE 5 MG PO TABS
ORAL_TABLET | ORAL | 2 refills | Status: DC
  Filled 2021-02-21: qty 30, 30d supply, fill #0
  Filled 2021-03-24: qty 30, 30d supply, fill #1

## 2021-02-21 ENCOUNTER — Other Ambulatory Visit: Payer: Self-pay

## 2021-02-28 DIAGNOSIS — R42 Dizziness and giddiness: Secondary | ICD-10-CM | POA: Diagnosis not present

## 2021-03-06 DIAGNOSIS — R42 Dizziness and giddiness: Secondary | ICD-10-CM | POA: Diagnosis not present

## 2021-03-10 ENCOUNTER — Other Ambulatory Visit: Payer: Self-pay

## 2021-03-17 ENCOUNTER — Other Ambulatory Visit: Payer: Self-pay | Admitting: Unknown Physician Specialty

## 2021-03-17 DIAGNOSIS — R42 Dizziness and giddiness: Secondary | ICD-10-CM

## 2021-03-24 ENCOUNTER — Other Ambulatory Visit: Payer: Self-pay

## 2021-03-25 ENCOUNTER — Other Ambulatory Visit: Payer: Self-pay

## 2021-03-25 MED ORDER — ALPRAZOLAM 0.25 MG PO TABS
ORAL_TABLET | ORAL | 3 refills | Status: DC
  Filled 2021-03-25: qty 60, 30d supply, fill #0

## 2021-04-01 ENCOUNTER — Other Ambulatory Visit: Payer: 59

## 2021-04-02 ENCOUNTER — Other Ambulatory Visit: Payer: Self-pay

## 2021-04-02 ENCOUNTER — Ambulatory Visit
Admission: RE | Admit: 2021-04-02 | Discharge: 2021-04-02 | Disposition: A | Discharge status: Home / Self Care | Payer: 59 | Source: Ambulatory Visit | Attending: Unknown Physician Specialty | Admitting: Unknown Physician Specialty

## 2021-04-02 DIAGNOSIS — I6782 Cerebral ischemia: Secondary | ICD-10-CM | POA: Diagnosis not present

## 2021-04-02 DIAGNOSIS — R42 Dizziness and giddiness: Secondary | ICD-10-CM

## 2021-04-02 DIAGNOSIS — J341 Cyst and mucocele of nose and nasal sinus: Secondary | ICD-10-CM | POA: Diagnosis not present

## 2021-04-02 MED ORDER — GADOBENATE DIMEGLUMINE 529 MG/ML IV SOLN
17.0000 mL | Freq: Once | INTRAVENOUS | Status: AC
  Administered 2021-04-02: 10:00:00 17 mL via INTRAVENOUS

## 2021-04-08 NOTE — Progress Notes (Signed)
[facility]  PATIENT: Tracy Barnes DOB: 03/31/1956  REFERRING CLINICIAN: Beverly Gust, MD HISTORY FROM: self and daughter REASON FOR VISIT: dizziness   HISTORICAL  CHIEF COMPLAINT:  Chief Complaint  Patient presents with   Dizziness    RM 1 with daughter Caral Whan  Pt is well, dizziness started when she was about 65, it has worsen over time. Recently had about 4 spells in a month. Daughter states she has blacked out from spells.     HISTORY OF PRESENT ILLNESS:  The patient presents for evaluation of dizziness (lightheadedness and vertigo) which began when she was 65 years old. When episodes occur the room starts spinning and she feels like she is going to black out. This is associated with ear fullness, decreased hearing, and palpitations. Eases her self down to the floor and takes some breaths until it resolves. Usually lasts for about 5 minutes at a time. No clear triggers. Has occurred when she was bending over and standing back up, and when she was lying in bed. Has not noticed her hearing has worsening over time. She has passed out for a few seconds during an episode. Never had tongue biting, jerking/twitching, or loss of continence. When she regains consciousness she is immediately back to baseline without confusion.   She does report headaches which are sometimes associated with her dizziness. They severe enough that she will need to lie down and are associated with photophobia and nausea.  She has previously done vestibular therapy which was helpful though dizziness never resolved. She has exercises to do at home when she experiences vertigo.  Follows with ENT who ordered vestibular testing. Dix-Hallpike did not evoke vertigo or nystagmus. She did have headshaking induced nystagmus. Per vestibular testing "VNG cannot rule out a problem with vestibular or central vestibular system. Headshaking nystagmus can be present with mild unilateral weakness or  an acute lesion. It is also sometimes seen in Meniere's and problems of the central vestibular system."  MRI brain with contrast 04/02/21 was unremarkable.  OTHER MEDICAL CONDITIONS: DM, HTN, hypothyroidism, HLD, pulmonary sarcoidosis   REVIEW OF SYSTEMS: Full 14 system review of systems performed and negative with exception of:  lightheadedness, vertigo  ALLERGIES: No Known Allergies  HOME MEDICATIONS: Outpatient Medications Prior to Visit  Medication Sig Dispense Refill   ALPRAZolam (XANAX) 0.25 MG tablet Take 0.25 mg by mouth at bedtime as needed.     aspirin EC 81 MG tablet Take 81 mg by mouth daily.     atorvastatin (LIPITOR) 20 MG tablet TAKE 1 TABLET BY MOUTH DAILY 90 tablet 3   clobetasol cream (TEMOVATE) 0.05 %   2   desvenlafaxine (PRISTIQ) 100 MG 24 hr tablet TAKE 1 TABLET BY MOUTH ONCE DAILY 90 tablet 3   hydrochlorothiazide (HYDRODIURIL) 25 MG tablet TAKE 1 CAPSULE BY MOUTH DAILY Oral Once a day 90 days 90 tablet 3   hydrochlorothiazide (MICROZIDE) 12.5 MG capsule TAKE 1 CAPSULE BY MOUTH DAILY 90 capsule 3   levothyroxine (SYNTHROID) 100 MCG tablet TAKE 1 TABLET BY MOUTH ONCE DAILY 90 tablet 3   meclizine (ANTIVERT) 25 MG tablet Take 1 tablet (25 mg total) by mouth 3 (three) times daily as needed for dizziness. 30 tablet 1   metFORMIN (GLUCOPHAGE-XR) 500 MG 24 hr tablet TAKE 1 TABLET BY MOUTH EVERY MORNING AND 1 TABLET EVERY EVENING 180 tablet 3   ondansetron (ZOFRAN ODT) 4 MG disintegrating tablet Take 1 tablet (4 mg total) by mouth every 8 (eight) hours  as needed for nausea or vomiting. 20 tablet 1   TRUE METRIX BLOOD GLUCOSE TEST test strip   6   TRUEPLUS LANCETS 30G MISC   6   zolpidem (AMBIEN) 5 MG tablet TAKE 1 TABLET BY MOUTH AT BEDTIME AS NEEDED 30 tablet 0   ALPRAZolam (XANAX) 0.25 MG tablet Take 1 tablet twice a day as needed, caution med can cause sedation 60 tablet 3   desvenlafaxine (PRISTIQ) 100 MG 24 hr tablet Take 100 mg by mouth daily.     fluticasone  (FLONASE) 50 MCG/ACT nasal spray Place 2 sprays into both nostrils daily. 16 g 0   hydrochlorothiazide (HYDRODIURIL) 12.5 MG tablet Take 12.5 mg by mouth daily.     hydrochlorothiazide (MICROZIDE) 12.5 MG capsule TAKE 1 CAPSULE BY MOUTH DAILY 90 capsule 2   ipratropium (ATROVENT) 0.03 % nasal spray Use 2 sprays in each nostril 2-3 times daily (Patient not taking: Reported on 09/24/2020) 30 mL 0   metFORMIN (GLUCOPHAGE) 500 MG tablet Take 500 mg by mouth 2 (two) times daily with a meal.      zolpidem (AMBIEN) 5 MG tablet TAKE 1 TABLET BY MOUTH AT BEDTIME AS NEEDED 30 tablet 2   No facility-administered medications prior to visit.    PAST MEDICAL HISTORY: Past Medical History:  Diagnosis Date   Cancer ([place]) 02/19/2010   left breast   Chest pain    Diabetes mellitus    Dyspnea on exertion    GERD (gastroesophageal reflux disease)    Hx of radiation therapy 06/18/10 to 08/01/10   L breast   Hyperlipidemia    Hypertension    Personal history of chemotherapy 2011   LEFT lumpectomy w/ radiation and chemo   Personal history of radiation therapy 2011   LEFT lumpectomy w/ radiation   Sarcoidosis    Thyroid disease     PAST SURGICAL HISTORY: Past Surgical History:  Procedure Laterality Date   ABDOMINAL HYSTERECTOMY     At 26 yrs of age. One ovary removed. Surgery due to fibroids per medical history form dated 02/25/10.   BREAST LUMPECTOMY Left 2010   LEFT lumpectomy w/ radiation   BREAST SURGERY  03/25/2010   left lumpectomy   DEEP AXILLARY SENTINEL NODE BIOPSY / EXCISION  03/25/2010   4/4 nodes negative   MEDIASTINOSCOPY N/A 12/27/2013   Procedure: MEDIASTINOSCOPY;  Surgeon: Melrose Nakayama, MD;  Location: [place];  Service: Thoracic;  Laterality: N/A;   PORT-A-CATH REMOVAL  06/05/2011   Procedure: MINOR REMOVAL PORT-A-CATH;  Surgeon: Adin Hector, MD;  Location: [facility];  Service: General;  Laterality: N/A;  right   PORTACATH PLACEMENT  04/18/2010   Dr. Fanny Skates   VIDEO BRONCHOSCOPY WITH ENDOBRONCHIAL ULTRASOUND N/A 12/27/2013   Procedure: VIDEO BRONCHOSCOPY WITH ENDOBRONCHIAL ULTRASOUND;  Surgeon: Melrose Nakayama, MD;  Location: [facility] OR;  Service: Thoracic;  Laterality: N/A;    FAMILY HISTORY: Family History  Problem Relation Age of Onset   Cancer Mother        Cancer of bladder and brain per medical history form dated 02/25/10.   Heart disease Father    COPD Father    Aortic stenosis Father    Breast cancer Neg Hx     SOCIAL HISTORY: Social History   Socioeconomic History   Marital status: Widowed    Spouse name: Not on file   Number of children: Not on file   Years of education: Not on file   Highest education level:  Not on file  Occupational History   Occupation: system Printmaker: [employer]  Tobacco Use   Smoking status: Former    Packs/day: 0.10    Years: 0.50    Pack years: 0.05    Types: Cigarettes    Quit date: 04/20/1977    Years since quitting: 44.0   Smokeless tobacco: Never  Substance and Sexual Activity   Alcohol use: No   Drug use: No   Sexual activity: Yes  Other Topics Concern   Not on file  Social History Narrative   Not on file   Social Determinants of Health   Financial Resource Strain: Not on file  Food Insecurity: Not on file  Transportation Needs: Not on file  Physical Activity: Not on file  Stress: Not on file  Social Connections: Not on file  Intimate Partner Violence: Not on file     PHYSICAL EXAM   GENERAL EXAM/CONSTITUTIONAL: Vitals:  Vitals:   04/09/21 0854  BP: (!) 144/87  Pulse: 66  Weight: 184 lb (83.5 kg)  Height: 5\' 3"  (1.6 m)   Body mass index is 32.59 kg/m. Wt Readings from Last 3 Encounters:  04/09/21 184 lb (83.5 kg)  09/07/20 180 lb (81.6 kg)  06/27/20 187 lb (84.8 kg)   Patient is in no distress; well developed, nourished and groomed; neck is supple  CARDIOVASCULAR: Examination of peripheral vascular system by observation and palpation is  normal  EYES: Pupils round and reactive to light, Visual fields full to confrontation, Extraocular movements intacts,   MUSCULOSKELETAL: Gait, strength, tone, movements noted in Neurologic exam below  NEUROLOGIC: MENTAL STATUS:  awake, alert, oriented to person, place and time recent and remote memory intact normal attention and concentration language fluent, comprehension intact, naming intact fund of knowledge appropriate  CRANIAL NERVE:  2nd, 3rd, 4th, 6th - pupils equal and reactive to light, visual fields full to confrontation, extraocular muscles intact, no nystagmus 5th - facial sensation symmetric 7th - facial strength symmetric 8th - hearing intact 9th - palate elevates symmetrically, uvula midline 11th - shoulder shrug symmetric 12th - tongue protrusion midline  MOTOR:  normal bulk and tone, full strength in the BUE, BLE  SENSORY:  normal and symmetric to light touch all 4 extremities  COORDINATION:  finger-nose-finger,  heel-to-shin normal  REFLEXES:  deep tendon reflexes present and symmetric  GAIT/STATION:  normal     DIAGNOSTIC DATA (LABS, IMAGING, TESTING) - I reviewed patient records, labs, notes, testing and imaging myself where available.  Lab Results  Component Value Date   WBC 5.4 07/27/2017   HGB 15.5 07/27/2017   HCT 46.1 07/27/2017   MCV 79.8 (L) 07/27/2017   PLT 240 07/27/2017      Component Value Date/Time   NA 137 07/27/2017 0623   NA 139 06/07/2014 0753   K 3.8 07/27/2017 0623   K 4.1 06/07/2014 0753   CL 103 07/27/2017 0623   CL 105 10/03/2012 0854   CO2 24 07/27/2017 0623   CO2 22 06/07/2014 0753   GLUCOSE 191 (H) 07/27/2017 0623   GLUCOSE 146 (H) 06/07/2014 0753   GLUCOSE 120 (H) 10/03/2012 0854   BUN 14 07/27/2017 0623   BUN 17.1 06/07/2014 0753   CREATININE 0.65 07/27/2017 0623   CREATININE 0.8 06/07/2014 0753   CALCIUM 9.4 07/27/2017 0623   CALCIUM 9.0 06/07/2014 0753   PROT 7.2 07/27/2017 0623   PROT 6.4  06/07/2014 0753   ALBUMIN 4.4 07/27/2017 0623   ALBUMIN  3.8 06/07/2014 0753   AST 28 07/27/2017 0623   AST 15 06/07/2014 0753   ALT 19 07/27/2017 0623   ALT 18 06/07/2014 0753   ALKPHOS 57 07/27/2017 0623   ALKPHOS 70 06/07/2014 0753   BILITOT 0.8 07/27/2017 0623   BILITOT 0.38 06/07/2014 0753   GFRNONAA >60 07/27/2017 0623   GFRAA >60 07/27/2017 0623   No results found for: CHOL, HDL, LDLCALC, LDLDIRECT, TRIG, CHOLHDL Lab Results  Component Value Date   HGBA1C 6.3 01/21/2016   No results found for: VITAMINB12 No results found for: TSH    ASSESSMENT AND PLAN  65 y.o. year old female with a history of DM, HTN, hypothyroidism, HLD, pulmonary sarcoidosis who presents for evaluation of light-headedness and vertigo episodes which are associated with ear fullness and hearing loss. MRI brain was unremarkable without evidence of a neurologic process to explain her symptoms. Will order CTA head/neck to assess for flow-limiting stenosis as a cause for her episodes, but suspicion is low as she has had these episodes since she was in her 32s. Discussed how migraines can cause vertigo and offered empiric migraine treatment to see if this would reduce her vertigo. She is adamant that her symptoms are not associated with her headaches and is not interested in trying migraine medication at this time. She would like a second opinion from ENT to evaluate if Meniere's could be a possibility. Referral placed today.  1. Vertigo     PLAN: -CTA head/neck -Referral to ENT for 2nd opinion re ?Meniere's disease  Orders Placed This Encounter  Procedures   CT ANGIO HEAD W OR WO CONTRAST   CT ANGIO NECK W OR WO CONTRAST   Ambulatory referral to ENT    Return in about 6 months (around 10/08/2021).    Genia Harold, MD 04/09/21 9:43 AM  I spent an average of 42 minutes chart reviewing and counseling the patient, with at least 50% of the time face to face with the patient.   [facility] [address] [place], [place] [address] [phone]

## 2021-04-09 ENCOUNTER — Telehealth: Payer: Self-pay | Admitting: Psychiatry

## 2021-04-09 ENCOUNTER — Other Ambulatory Visit: Payer: Self-pay

## 2021-04-09 ENCOUNTER — Encounter: Payer: Self-pay | Admitting: Psychiatry

## 2021-04-09 ENCOUNTER — Ambulatory Visit: Payer: 59 | Admitting: Psychiatry

## 2021-04-09 VITALS — BP 144/87 | HR 66 | Ht 63.0 in | Wt 184.0 lb

## 2021-04-09 DIAGNOSIS — R42 Dizziness and giddiness: Secondary | ICD-10-CM

## 2021-04-09 NOTE — Telephone Encounter (Signed)
[facility] no auth req. Order sent to GI, they will reach out to the patient to schedule.

## 2021-04-09 NOTE — Patient Instructions (Addendum)
CTA head/neck Referral to ENT for second opinion

## 2021-04-15 ENCOUNTER — Ambulatory Visit
Admission: RE | Admit: 2021-04-15 | Discharge: 2021-04-15 | Disposition: A | Discharge status: Home / Self Care | Payer: 59 | Source: Ambulatory Visit | Attending: Psychiatry | Admitting: Psychiatry

## 2021-04-15 ENCOUNTER — Other Ambulatory Visit: Payer: Self-pay

## 2021-04-15 DIAGNOSIS — M4312 Spondylolisthesis, cervical region: Secondary | ICD-10-CM | POA: Diagnosis not present

## 2021-04-15 DIAGNOSIS — I700 Atherosclerosis of aorta: Secondary | ICD-10-CM | POA: Diagnosis not present

## 2021-04-15 DIAGNOSIS — R42 Dizziness and giddiness: Secondary | ICD-10-CM

## 2021-04-15 DIAGNOSIS — R590 Localized enlarged lymph nodes: Secondary | ICD-10-CM | POA: Diagnosis not present

## 2021-04-15 DIAGNOSIS — M47812 Spondylosis without myelopathy or radiculopathy, cervical region: Secondary | ICD-10-CM | POA: Diagnosis not present

## 2021-04-15 DIAGNOSIS — I6523 Occlusion and stenosis of bilateral carotid arteries: Secondary | ICD-10-CM | POA: Diagnosis not present

## 2021-04-15 MED ORDER — IOPAMIDOL (ISOVUE-370) INJECTION 76%
75.0000 mL | Freq: Once | INTRAVENOUS | Status: AC | PRN
  Administered 2021-04-15: 12:00:00 75 mL via INTRAVENOUS

## 2021-04-17 ENCOUNTER — Telehealth: Payer: Self-pay | Admitting: Neurology

## 2021-04-17 NOTE — Telephone Encounter (Signed)
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

## 2021-04-17 NOTE — Telephone Encounter (Signed)
Called the patient and reviewed the results with the patient. Pt verbalized understanding. Pt had no questions at this time but was encouraged to call back if questions arise. Pt requested the results also be sent to Dr Forde Dandy and I have forwarded these to him.

## 2021-04-22 DIAGNOSIS — I700 Atherosclerosis of aorta: Secondary | ICD-10-CM | POA: Diagnosis not present

## 2021-04-22 DIAGNOSIS — I10 Essential (primary) hypertension: Secondary | ICD-10-CM | POA: Diagnosis not present

## 2021-04-22 DIAGNOSIS — E785 Hyperlipidemia, unspecified: Secondary | ICD-10-CM | POA: Diagnosis not present

## 2021-04-22 DIAGNOSIS — E119 Type 2 diabetes mellitus without complications: Secondary | ICD-10-CM | POA: Diagnosis not present

## 2021-04-22 DIAGNOSIS — K760 Fatty (change of) liver, not elsewhere classified: Secondary | ICD-10-CM | POA: Diagnosis not present

## 2021-04-22 DIAGNOSIS — G4700 Insomnia, unspecified: Secondary | ICD-10-CM | POA: Diagnosis not present

## 2021-04-22 DIAGNOSIS — I2584 Coronary atherosclerosis due to calcified coronary lesion: Secondary | ICD-10-CM | POA: Diagnosis not present

## 2021-04-22 DIAGNOSIS — E669 Obesity, unspecified: Secondary | ICD-10-CM | POA: Diagnosis not present

## 2021-04-22 DIAGNOSIS — E039 Hypothyroidism, unspecified: Secondary | ICD-10-CM | POA: Diagnosis not present

## 2021-04-23 ENCOUNTER — Other Ambulatory Visit: Payer: Self-pay

## 2021-04-24 ENCOUNTER — Other Ambulatory Visit: Payer: Self-pay

## 2021-04-24 MED ORDER — ZOLPIDEM TARTRATE 5 MG PO TABS
5.0000 mg | ORAL_TABLET | Freq: Every evening | ORAL | 2 refills | Status: AC | PRN
  Filled 2021-04-24: qty 30, 30d supply, fill #0
  Filled 2021-05-22: qty 30, 30d supply, fill #1
  Filled 2021-06-23: qty 30, 30d supply, fill #2

## 2021-05-05 ENCOUNTER — Other Ambulatory Visit: Payer: Self-pay

## 2021-05-07 NOTE — Progress Notes (Signed)
Office Note     CC: Asymptomatic right internal carotid artery stenosis Requesting Provider:  Reynold Bowen, MD  HPI: Tracy Barnes is a 66 y.o. (22-Sep-1955) female presenting at the request of .Reynold Bowen, MD for asymptomatic right internal carotid artery stenosis.  Patient was recently seen by neurology for evaluation of dizziness which began when she was 66 years old. For years episodes were infrequent, however the last couple of months, they have been happening monthly.  Tracy Barnes appreciates a flushed feeling in the dripping sound in her ear prior to the room spinning.  Usually she is able to sit down prior to blacking out.  These episodes have no clear triggers.  No seizure, no stroke, no TIA, no amaurosis.  When completed, she is back to baseline without confusion.  Work-up has included ENT and neurology, with no clear etiology.  Current differential includes central vestibular system issue versus Mnire's disease  When episodes occur the room starts spinning and she feels like she is going to black out. This is associated with ear fullness, decreased hearing, and palpitations. Eases her self down to the floor and takes some breaths until it resolves. Usually lasts for about 5 minutes at a time. No clear triggers. Has occurred when she was bending over and standing back up, and when she was lying in bed. Has not noticed her hearing has worsening over time. She has passed out for a few seconds during an episode. Never had tongue biting, jerking/twitching, or loss of continence. When she regains consciousness she is immediately back to baseline without confusion.  On exam today, Tracy Barnes was doing well.  She was accompanied by her daughter.  She has had no episodes in the last few weeks.  The pt is  on a statin for cholesterol management.  The pt is  on a daily aspirin.   Other AC:  - The pt is  on medication for hypertension.   The pt is  diabetic.  Tobacco hx:  -  Past Medical  History:  Diagnosis Date   Cancer ([place]) 02/19/2010   left breast   Chest pain    Diabetes mellitus    Dyspnea on exertion    GERD (gastroesophageal reflux disease)    Hx of radiation therapy 06/18/10 to 08/01/10   L breast   Hyperlipidemia    Hypertension    Personal history of chemotherapy 2011   LEFT lumpectomy w/ radiation and chemo   Personal history of radiation therapy 2011   LEFT lumpectomy w/ radiation   Sarcoidosis    Thyroid disease     Past Surgical History:  Procedure Laterality Date   ABDOMINAL HYSTERECTOMY     At 65 yrs of age. One ovary removed. Surgery due to fibroids per medical history form dated 02/25/10.   BREAST LUMPECTOMY Left 2010   LEFT lumpectomy w/ radiation   BREAST SURGERY  03/25/2010   left lumpectomy   DEEP AXILLARY SENTINEL NODE BIOPSY / EXCISION  03/25/2010   4/4 nodes negative   MEDIASTINOSCOPY N/A 12/27/2013   Procedure: MEDIASTINOSCOPY;  Surgeon: Melrose Nakayama, MD;  Location: [facility];  Service: Thoracic;  Laterality: N/A;   PORT-A-CATH REMOVAL  06/05/2011   Procedure: MINOR REMOVAL PORT-A-CATH;  Surgeon: Adin Hector, MD;  Location: [facility];  Service: General;  Laterality: N/A;  right   PORTACATH PLACEMENT  04/18/2010   Dr. Fanny Skates   VIDEO BRONCHOSCOPY WITH ENDOBRONCHIAL ULTRASOUND N/A 12/27/2013   Procedure: VIDEO BRONCHOSCOPY WITH ENDOBRONCHIAL  ULTRASOUND;  Surgeon: Melrose Nakayama, MD;  Location: [facility];  Service: Thoracic;  Laterality: N/A;    Social History   Socioeconomic History   Marital status: Widowed    Spouse name: Not on file   Number of children: Not on file   Years of education: Not on file   Highest education level: Not on file  Occupational History   Occupation: system analyst    Employer: [employer]  Tobacco Use   Smoking status: Former    Packs/day: 0.10    Years: 0.50    Pack years: 0.05    Types: Cigarettes    Quit date: 04/20/1977    Years since quitting: 44.0   Smokeless  tobacco: Never  Substance and Sexual Activity   Alcohol use: No   Drug use: No   Sexual activity: Yes  Other Topics Concern   Not on file  Social History Narrative   Not on file   Social Determinants of Health   Financial Resource Strain: Not on file  Food Insecurity: Not on file  Transportation Needs: Not on file  Physical Activity: Not on file  Stress: Not on file  Social Connections: Not on file  Intimate Partner Violence: Not on file    Family History  Problem Relation Age of Onset   Cancer Mother        Cancer of bladder and brain per medical history form dated 02/25/10.   Heart disease Father    COPD Father    Aortic stenosis Father    Breast cancer Neg Hx     Current Outpatient Medications  Medication Sig Dispense Refill   ALPRAZolam (XANAX) 0.25 MG tablet Take 0.25 mg by mouth at bedtime as needed.     aspirin EC 81 MG tablet Take 81 mg by mouth daily.     atorvastatin (LIPITOR) 20 MG tablet TAKE 1 TABLET BY MOUTH DAILY 90 tablet 3   clobetasol cream (TEMOVATE) 0.05 %   2   desvenlafaxine (PRISTIQ) 100 MG 24 hr tablet TAKE 1 TABLET BY MOUTH ONCE DAILY 90 tablet 3   hydrochlorothiazide (HYDRODIURIL) 25 MG tablet TAKE 1 CAPSULE BY MOUTH DAILY Oral Once a day 90 days 90 tablet 3   hydrochlorothiazide (MICROZIDE) 12.5 MG capsule TAKE 1 CAPSULE BY MOUTH DAILY 90 capsule 3   levothyroxine (SYNTHROID) 100 MCG tablet TAKE 1 TABLET BY MOUTH ONCE DAILY 90 tablet 3   meclizine (ANTIVERT) 25 MG tablet Take 1 tablet (25 mg total) by mouth 3 (three) times daily as needed for dizziness. 30 tablet 1   metFORMIN (GLUCOPHAGE-XR) 500 MG 24 hr tablet TAKE 1 TABLET BY MOUTH EVERY MORNING AND 1 TABLET EVERY EVENING 180 tablet 3   ondansetron (ZOFRAN ODT) 4 MG disintegrating tablet Take 1 tablet (4 mg total) by mouth every 8 (eight) hours as needed for nausea or vomiting. 20 tablet 1   TRUE METRIX BLOOD GLUCOSE TEST test strip   6   TRUEPLUS LANCETS 30G MISC   6   zolpidem (AMBIEN) 5 MG  tablet TAKE 1 TABLET BY MOUTH AT BEDTIME AS NEEDED 30 tablet 0   zolpidem (AMBIEN) 5 MG tablet TAKE 1 TABLET BY MOUTH AT BEDTIME AS NEEDED 30 tablet 2   No current facility-administered medications for this visit.    No Known Allergies   REVIEW OF SYSTEMS:   X denotes positive finding, denotes negative finding Cardiac  Comments:  Chest pain or chest pressure:    Shortness of breath upon exertion:  Short of breath when lying flat:    Irregular heart rhythm:        Vascular    Pain in calf, thigh, or hip brought on by ambulation:    Pain in feet at night that wakes you up from your sleep:     Blood clot in your veins:    Leg swelling:         Pulmonary    Oxygen at home:    Productive cough:     Wheezing:         Neurologic    Sudden weakness in arms or legs:     Sudden numbness in arms or legs:     Sudden onset of difficulty speaking or slurred speech:    Temporary loss of vision in one eye:     Problems with dizziness:         Gastrointestinal    Blood in stool:     Vomited blood:         Genitourinary    Burning when urinating:     Blood in urine:        Psychiatric    Major depression:         Hematologic    Bleeding problems:    Problems with blood clotting too easily:        Skin    Rashes or ulcers:        Constitutional    Fever or chills:      PHYSICAL EXAMINATION:  There were no vitals filed for this visit.  General:  WDWN in NAD; vital signs documented above Gait: Not observed HENT: WNL, normocephalic Pulmonary: normal non-labored breathing , without wheezing Cardiac: regular HR Abdomen: soft, NT, no masses Skin: without rashes Vascular Exam/Pulses:  Right Left  Radial 2+ (normal) 2+ (normal)  Ulnar 2+ (normal) 2+ (normal)  Femoral    Popliteal    DP 2+ (normal) 2+ (normal)  PT 2+ (normal) 2+ (normal)   Extremities: without ischemic changes, without Gangrene , without cellulitis; without open wounds;  Musculoskeletal: no muscle  wasting or atrophy  Neurologic: A&O X 3;  No focal weakness or paresthesias are detected Psychiatric:  The pt has Normal affect.   Non-Invasive Vascular Imaging:   CTA NECK FINDINGS   Aortic arch: Common origin of the left common carotid artery and innominate artery noted. Aortic arch is within normal limits. Atherosclerotic changes are present in the proximal left subclavian artery without significant stenosis.   Right carotid system: Proximal right common carotid artery is within normal limits. Dense calcifications are present the distal common carotid artery and bifurcation without significant stenosis relative to the more distal vessel. The right common carotid artery is within normal limits otherwise.   Left carotid system: Left common carotid artery is within normal limits. Noncalcified plaque is present at the bifurcation without significant stenosis. Cervical left ICA is within normal limits.    ASSESSMENT/PLAN: Tracy Barnes is a 66 y.o. female presenting with right-sided asymptomatic carotid artery stenosis.  Vertebral arteries were also evaluated demonstrating no stenosis appreciated on CT angio. Zondra's right internal carotid artery appears to be have less than 40% stenosis.  At her young age, this is something we will continue to follow with yearly carotid duplex ultrasounds.  She will have her first ultrasound in the coming weeks as a baseline.  Carmelia and I discussed the signs and symptoms of stroke, TIA, amaurosis.  She was asked to seek immediate medical attention should any  of these occur. Carotid artery stenosis is not the etiology of her dizziness, nor does she have findings consistent with vertebrobasilar insufficiency, Bow-Hunter's syndrome, subclavian steal syndrome.  Cattaleya is on best medical therapy which includes aspirin, statin.  Her statin medication can be increased to 40mg  as high intensity statin therapy has been proven to decrease the risk of  cerebrovascular and cardiovascular events over the next 5 years.   Broadus John, MD [facility] [phone]

## 2021-05-08 ENCOUNTER — Other Ambulatory Visit: Payer: Self-pay

## 2021-05-09 ENCOUNTER — Ambulatory Visit: Payer: 59 | Admitting: Vascular Surgery

## 2021-05-09 ENCOUNTER — Other Ambulatory Visit: Payer: Self-pay

## 2021-05-09 ENCOUNTER — Encounter: Payer: Self-pay | Admitting: Vascular Surgery

## 2021-05-09 VITALS — BP 140/88 | HR 85 | Temp 97.9°F | Resp 20 | Ht 63.0 in | Wt 186.0 lb

## 2021-05-09 DIAGNOSIS — I6521 Occlusion and stenosis of right carotid artery: Secondary | ICD-10-CM

## 2021-05-11 ENCOUNTER — Other Ambulatory Visit: Payer: Self-pay

## 2021-05-11 DIAGNOSIS — I6521 Occlusion and stenosis of right carotid artery: Secondary | ICD-10-CM

## 2021-05-20 ENCOUNTER — Other Ambulatory Visit: Payer: Self-pay

## 2021-05-20 ENCOUNTER — Ambulatory Visit: Payer: 59 | Admitting: Physician Assistant

## 2021-05-20 ENCOUNTER — Ambulatory Visit (HOSPITAL_COMMUNITY)
Admission: RE | Admit: 2021-05-20 | Discharge: 2021-05-20 | Disposition: A | Discharge status: Home / Self Care | Payer: 59 | Source: Ambulatory Visit | Attending: Vascular Surgery | Admitting: Vascular Surgery

## 2021-05-20 VITALS — BP 137/85 | HR 79 | Temp 96.9°F | Ht 63.0 in | Wt 188.2 lb

## 2021-05-20 DIAGNOSIS — I6523 Occlusion and stenosis of bilateral carotid arteries: Secondary | ICD-10-CM

## 2021-05-20 DIAGNOSIS — I6521 Occlusion and stenosis of right carotid artery: Secondary | ICD-10-CM | POA: Diagnosis not present

## 2021-05-20 NOTE — Progress Notes (Signed)
HISTORY AND PHYSICAL     CC:  follow up. Requesting Provider:  Reynold Bowen, MD  HPI: This is a 66 y.o. female here for follow up for carotid artery stenosis.    Pt was last seen by Dr. Virl Cagey at the request of Dr. Forde Dandy for asymptomatic right ICA stenosis.  She was also evaluated by neurology for dizziness, which began when she was 66 yo.    For years episodes were infrequent, however the last couple of months, they have been happening monthly.  Samarrah appreciates a flushed feeling in the dripping sound in her ear prior to the room spinning.  Usually she is able to sit down prior to blacking out.  These episodes have no clear triggers.  No seizure, no stroke, no TIA, no amaurosis.  When completed, she is back to baseline without confusion.  Work-up has included ENT and neurology, with no clear etiology.  Current differential includes central vestibular system issue versus Mnire's disease  When episodes occur the room starts spinning and she feels like she is going to black out. This is associated with ear fullness, decreased hearing, and palpitations. Eases her self down to the floor and takes some breaths until it resolves. Usually lasts for about 5 minutes at a time. No clear triggers. Has occurred when she was bending over and standing back up, and when she was lying in bed. Has not noticed her hearing has worsening over time. She has passed out for a few seconds during an episode. Never had tongue biting, jerking/twitching, or loss of continence. When she regains consciousness she is immediately back to baseline without confusion.  Pt returns today for follow up.    Pt denies any amaurosis fugax, speech difficulties, weakness, numbness, paralysis or clumsiness or facial droop.    The pt is on a statin for cholesterol management.  The pt is on a daily aspirin.   Other AC:  none The pt is on diuretic for hypertension.   The pt is diabetic.   Tobacco hx:  former  Pt does not have  family hx of AAA.  Past Medical History:  Diagnosis Date   Cancer ([place]) 02/19/2010   left breast   Chest pain    Diabetes mellitus    Dyspnea on exertion    GERD (gastroesophageal reflux disease)    Hx of radiation therapy 06/18/10 to 08/01/10   L breast   Hyperlipidemia    Hypertension    Personal history of chemotherapy 2011   LEFT lumpectomy w/ radiation and chemo   Personal history of radiation therapy 2011   LEFT lumpectomy w/ radiation   Sarcoidosis    Thyroid disease     Past Surgical History:  Procedure Laterality Date   ABDOMINAL HYSTERECTOMY     At 63 yrs of age. One ovary removed. Surgery due to fibroids per medical history form dated 02/25/10.   BREAST LUMPECTOMY Left 2010   LEFT lumpectomy w/ radiation   BREAST SURGERY  03/25/2010   left lumpectomy   DEEP AXILLARY SENTINEL NODE BIOPSY / EXCISION  03/25/2010   4/4 nodes negative   MEDIASTINOSCOPY N/A 12/27/2013   Procedure: MEDIASTINOSCOPY;  Surgeon: Melrose Nakayama, MD;  Location: [facility];  Service: Thoracic;  Laterality: N/A;   PORT-A-CATH REMOVAL  06/05/2011   Procedure: MINOR REMOVAL PORT-A-CATH;  Surgeon: Adin Hector, MD;  Location: [facility];  Service: General;  Laterality: N/A;  right   PORTACATH PLACEMENT  04/18/2010   Dr. Fanny Skates  VIDEO BRONCHOSCOPY WITH ENDOBRONCHIAL ULTRASOUND N/A 12/27/2013   Procedure: VIDEO BRONCHOSCOPY WITH ENDOBRONCHIAL ULTRASOUND;  Surgeon: Melrose Nakayama, MD;  Location[facility];  Service: Thoracic;  Laterality: N/A;    No Known Allergies  Current Outpatient Medications  Medication Sig Dispense Refill   ALPRAZolam (XANAX) 0.25 MG tablet Take 0.25 mg by mouth at bedtime as needed.     aspirin EC 81 MG tablet Take 81 mg by mouth daily.     atorvastatin (LIPITOR) 20 MG tablet TAKE 1 TABLET BY MOUTH DAILY 90 tablet 3   clobetasol cream (TEMOVATE) 0.05 %   2   desvenlafaxine (PRISTIQ) 100 MG 24 hr tablet TAKE 1 TABLET BY MOUTH ONCE DAILY 90 tablet 3    hydrochlorothiazide (HYDRODIURIL) 25 MG tablet TAKE 1 CAPSULE BY MOUTH DAILY Oral Once a day 90 days 90 tablet 3   levothyroxine (SYNTHROID) 100 MCG tablet TAKE 1 TABLET BY MOUTH ONCE DAILY 90 tablet 3   meclizine (ANTIVERT) 25 MG tablet Take 1 tablet (25 mg total) by mouth 3 (three) times daily as needed for dizziness. 30 tablet 1   metFORMIN (GLUCOPHAGE-XR) 500 MG 24 hr tablet TAKE 1 TABLET BY MOUTH EVERY MORNING AND 1 TABLET EVERY EVENING 180 tablet 3   ondansetron (ZOFRAN ODT) 4 MG disintegrating tablet Take 1 tablet (4 mg total) by mouth every 8 (eight) hours as needed for nausea or vomiting. 20 tablet 1   TRUE METRIX BLOOD GLUCOSE TEST test strip   6   TRUEPLUS LANCETS 30G MISC   6   zolpidem (AMBIEN) 5 MG tablet TAKE 1 TABLET BY MOUTH AT BEDTIME AS NEEDED 30 tablet 2   No current facility-administered medications for this visit.    Family History  Problem Relation Age of Onset   Cancer Mother        Cancer of bladder and brain per medical history form dated 02/25/10.   Heart disease Father    COPD Father    Aortic stenosis Father    Breast cancer Neg Hx     Social History   Socioeconomic History   Marital status: Widowed    Spouse name: Not on file   Number of children: Not on file   Years of education: Not on file   Highest education level: Not on file  Occupational History   Occupation: system analyst    Employer: [employer]  Tobacco Use   Smoking status: Former    Packs/day: 0.10    Years: 0.50    Pack years: 0.05    Types: Cigarettes    Quit date: 04/20/1977    Years since quitting: 44.1   Smokeless tobacco: Never  Substance and Sexual Activity   Alcohol use: No   Drug use: No   Sexual activity: Yes  Other Topics Concern   Not on file  Social History Narrative   Not on file   Social Determinants of Health   Financial Resource Strain: Not on file  Food Insecurity: Not on file  Transportation Needs: Not on file  Physical Activity: Not on file   Stress: Not on file  Social Connections: Not on file  Intimate Partner Violence: Not on file     REVIEW OF SYSTEMS:   [X]  denotes positive finding, [ ]  denotes negative finding Cardiac  Comments:  Chest pain or chest pressure:    Shortness of breath upon exertion:    Short of breath when lying flat:    Irregular heart rhythm:  Vascular    Pain in calf, thigh, or hip brought on by ambulation:    Pain in feet at night that wakes you up from your sleep:     Blood clot in your veins:    Leg swelling:         Pulmonary    Oxygen at home:    Productive cough:     Wheezing:         Neurologic    Sudden weakness in arms or legs:     Sudden numbness in arms or legs:     Sudden onset of difficulty speaking or slurred speech:    Temporary loss of vision in one eye:     Problems with dizziness:         Gastrointestinal    Blood in stool:     Vomited blood:         Genitourinary    Burning when urinating:     Blood in urine:        Psychiatric    Major depression:         Hematologic    Bleeding problems:    Problems with blood clotting too easily:        Skin    Rashes or ulcers:        Constitutional    Fever or chills:      PHYSICAL EXAMINATION:  Today's Vitals   05/20/21 0833 05/20/21 0836  BP: 133/80 137/85  Pulse: 82 79  Temp: (!) 96.9 F (36.1 C)   Weight: 188 lb 3.2 oz (85.4 kg)   Height: 5\' 3"  (1.6 m)   PainSc: 0-No pain    Body mass index is 33.34 kg/m.   General:  WDWN in NAD; vital signs documented above Gait: Not observed HENT: WNL, normocephalic Pulmonary: normal non-labored breathing Cardiac: regular HR, without carotid bruits Abdomen: soft, NT; aortic pulse is not palpable Skin: without rashes Vascular Exam/Pulses:  Right Left  Radial 2+ (normal) 2+ (normal)   Extremities: without ischemic changes, without Gangrene , without cellulitis; without open wounds Musculoskeletal: no muscle wasting or atrophy  Neurologic: A&O X 3;  moving all extremities equally; speech is fluent/normal Psychiatric:  The pt has Normal affect.   Non-Invasive Vascular Imaging:   Carotid Duplex on 05/20/2021: Right:  1-39% ICA stenosis Left:  1-39% ICA stenosis Vertebrals:  Bilateral vertebral arteries demonstrate antegrade flow.  Subclavians: Normal flow hemodynamics were seen in bilateral subclavian arteries.    ASSESSMENT/PLAN:: 66 y.o. female here for follow up carotid artery stenosis   -duplex today reveals 1-39% bilateral ICA stenosis and she is asymptomatic -discussed s/s of stroke with pt and she understands should she develop any of these sx, she will go to the nearest ER or call 911. -pt will f/u in one with carotid duplex-if normal, may be able to have her follow up as needed. -pt will call sooner should they have any issues. -continue statin/asa    Leontine Locket, [facility] [phone]  Clinic MD:  Carlis Abbott

## 2021-05-22 ENCOUNTER — Other Ambulatory Visit: Payer: Self-pay

## 2021-05-22 MED ORDER — NITROFURANTOIN MONOHYD MACRO 100 MG PO CAPS
ORAL_CAPSULE | ORAL | 0 refills | Status: AC
  Filled 2021-05-22: qty 10, 5d supply, fill #0

## 2021-05-22 MED ORDER — ALPRAZOLAM 0.25 MG PO TABS
ORAL_TABLET | ORAL | 4 refills | Status: DC
  Filled 2021-05-22: qty 60, 30d supply, fill #0
  Filled 2021-07-21: qty 60, 30d supply, fill #1
  Filled 2021-08-18: qty 60, 30d supply, fill #2
  Filled 2021-10-17: qty 60, 30d supply, fill #3
  Filled 2021-11-14: qty 60, 30d supply, fill #4

## 2021-05-23 ENCOUNTER — Encounter (INDEPENDENT_AMBULATORY_CARE_PROVIDER_SITE_OTHER): Payer: Self-pay

## 2021-05-29 ENCOUNTER — Other Ambulatory Visit: Payer: Self-pay

## 2021-05-29 MED ORDER — FREESTYLE FREEDOM LITE W/DEVICE KIT
PACK | 1 refills | Status: DC
  Filled 2021-05-29: qty 1, 1d supply, fill #0
  Filled 2021-10-17: qty 1, 30d supply, fill #1
  Filled 2021-11-14: qty 1, 1d supply, fill #1

## 2021-06-12 ENCOUNTER — Other Ambulatory Visit: Payer: Self-pay

## 2021-06-23 ENCOUNTER — Other Ambulatory Visit: Payer: Self-pay

## 2021-06-24 DIAGNOSIS — H524 Presbyopia: Secondary | ICD-10-CM | POA: Diagnosis not present

## 2021-07-03 ENCOUNTER — Other Ambulatory Visit: Payer: Self-pay

## 2021-07-03 DIAGNOSIS — Z86010 Personal history of colonic polyps: Secondary | ICD-10-CM | POA: Diagnosis not present

## 2021-07-03 DIAGNOSIS — R1011 Right upper quadrant pain: Secondary | ICD-10-CM | POA: Diagnosis not present

## 2021-07-03 DIAGNOSIS — K5730 Diverticulosis of large intestine without perforation or abscess without bleeding: Secondary | ICD-10-CM | POA: Diagnosis not present

## 2021-07-03 DIAGNOSIS — E669 Obesity, unspecified: Secondary | ICD-10-CM | POA: Diagnosis not present

## 2021-07-03 DIAGNOSIS — K219 Gastro-esophageal reflux disease without esophagitis: Secondary | ICD-10-CM | POA: Diagnosis not present

## 2021-07-03 DIAGNOSIS — R140 Abdominal distension (gaseous): Secondary | ICD-10-CM | POA: Diagnosis not present

## 2021-07-03 MED ORDER — PANTOPRAZOLE SODIUM 40 MG PO TBEC
DELAYED_RELEASE_TABLET | ORAL | 4 refills | Status: DC
  Filled 2021-07-03: qty 90, 90d supply, fill #0
  Filled 2021-09-04 – 2021-09-18 (×3): qty 90, 90d supply, fill #1
  Filled 2022-01-06: qty 90, 90d supply, fill #2
  Filled 2022-04-06: qty 90, 90d supply, fill #3

## 2021-07-04 ENCOUNTER — Other Ambulatory Visit: Payer: Self-pay | Admitting: Gastroenterology

## 2021-07-04 ENCOUNTER — Other Ambulatory Visit
Admission: RE | Admit: 2021-07-04 | Discharge: 2021-07-04 | Disposition: A | Discharge status: Home / Self Care | Payer: 59 | Attending: Gastroenterology | Admitting: Gastroenterology

## 2021-07-04 DIAGNOSIS — Z86010 Personal history of colonic polyps: Secondary | ICD-10-CM | POA: Diagnosis not present

## 2021-07-04 DIAGNOSIS — R140 Abdominal distension (gaseous): Secondary | ICD-10-CM | POA: Insufficient documentation

## 2021-07-04 DIAGNOSIS — R1011 Right upper quadrant pain: Secondary | ICD-10-CM | POA: Diagnosis not present

## 2021-07-04 DIAGNOSIS — K5730 Diverticulosis of large intestine without perforation or abscess without bleeding: Secondary | ICD-10-CM | POA: Insufficient documentation

## 2021-07-04 LAB — CBC WITH DIFFERENTIAL/PLATELET
Abs Immature Granulocytes: 0.06 10*3/uL (ref 0.00–0.07)
Basophils Absolute: 0.1 10*3/uL (ref 0.0–0.1)
Basophils Relative: 1 %
Eosinophils Absolute: 0.2 10*3/uL (ref 0.0–0.5)
Eosinophils Relative: 3 %
HCT: 44.4 % (ref 36.0–46.0)
Hemoglobin: 14.9 g/dL (ref 12.0–15.0)
Immature Granulocytes: 1 %
Lymphocytes Relative: 17 %
Lymphs Abs: 1.1 10*3/uL (ref 0.7–4.0)
MCH: 26.8 pg (ref 26.0–34.0)
MCHC: 33.6 g/dL (ref 30.0–36.0)
MCV: 80 fL (ref 80.0–100.0)
Monocytes Absolute: 0.5 10*3/uL (ref 0.1–1.0)
Monocytes Relative: 7 %
Neutro Abs: 4.6 10*3/uL (ref 1.7–7.7)
Neutrophils Relative %: 71 %
Platelets: 226 10*3/uL (ref 150–400)
RBC: 5.55 MIL/uL — ABNORMAL HIGH (ref 3.87–5.11)
RDW: 13.1 % (ref 11.5–15.5)
WBC: 6.5 10*3/uL (ref 4.0–10.5)
nRBC: 0 % (ref 0.0–0.2)

## 2021-07-04 LAB — HEPATIC FUNCTION PANEL
ALT: 37 U/L (ref 0–44)
AST: 37 U/L (ref 15–41)
Albumin: 4.2 g/dL (ref 3.5–5.0)
Alkaline Phosphatase: 53 U/L (ref 38–126)
Bilirubin, Direct: 0.1 mg/dL (ref 0.0–0.2)
Indirect Bilirubin: 0.8 mg/dL (ref 0.3–0.9)
Total Bilirubin: 0.9 mg/dL (ref 0.3–1.2)
Total Protein: 7.1 g/dL (ref 6.5–8.1)

## 2021-07-04 LAB — BASIC METABOLIC PANEL
Anion gap: 10 (ref 5–15)
BUN: 14 mg/dL (ref 8–23)
CO2: 25 mmol/L (ref 22–32)
Calcium: 8.9 mg/dL (ref 8.9–10.3)
Chloride: 99 mmol/L (ref 98–111)
Creatinine, Ser: 0.56 mg/dL (ref 0.44–1.00)
GFR, Estimated: >60 mL/min (ref >60)
Glucose, Bld: 163 mg/dL — ABNORMAL HIGH (ref 70–99)
Potassium: 3.9 mmol/L (ref 3.5–5.1)
Sodium: 134 mmol/L — ABNORMAL LOW (ref 135–145)

## 2021-07-04 LAB — TSH: TSH: 1.939 u[IU]/mL (ref 0.350–4.500)

## 2021-07-10 ENCOUNTER — Other Ambulatory Visit: Payer: Self-pay | Admitting: Gastroenterology

## 2021-07-10 DIAGNOSIS — R1011 Right upper quadrant pain: Secondary | ICD-10-CM

## 2021-07-15 ENCOUNTER — Other Ambulatory Visit: Payer: Self-pay

## 2021-07-15 ENCOUNTER — Ambulatory Visit
Admission: RE | Admit: 2021-07-15 | Discharge: 2021-07-15 | Disposition: A | Discharge status: Home / Self Care | Payer: 59 | Source: Ambulatory Visit | Attending: Gastroenterology | Admitting: Gastroenterology

## 2021-07-15 DIAGNOSIS — R1011 Right upper quadrant pain: Secondary | ICD-10-CM

## 2021-07-21 ENCOUNTER — Other Ambulatory Visit: Payer: Self-pay

## 2021-07-21 MED ORDER — ZOLPIDEM TARTRATE 5 MG PO TABS
ORAL_TABLET | ORAL | 3 refills | Status: DC
  Filled 2021-07-21: qty 30, 30d supply, fill #0
  Filled 2021-08-18: qty 30, 30d supply, fill #1
  Filled 2021-09-18: qty 30, 30d supply, fill #2
  Filled 2021-10-17: qty 30, 30d supply, fill #3

## 2021-07-23 DIAGNOSIS — L918 Other hypertrophic disorders of the skin: Secondary | ICD-10-CM | POA: Diagnosis not present

## 2021-07-23 DIAGNOSIS — L821 Other seborrheic keratosis: Secondary | ICD-10-CM | POA: Diagnosis not present

## 2021-08-18 ENCOUNTER — Other Ambulatory Visit: Payer: Self-pay

## 2021-08-18 MED ORDER — ATORVASTATIN CALCIUM 20 MG PO TABS
20.0000 mg | ORAL_TABLET | Freq: Every day | ORAL | 3 refills | Status: DC
  Filled 2021-08-18: qty 90, 90d supply, fill #0
  Filled 2021-11-14: qty 90, 90d supply, fill #1
  Filled 2022-02-25: qty 90, 90d supply, fill #2
  Filled 2022-05-28: qty 90, 90d supply, fill #3

## 2021-08-18 MED ORDER — LEVOTHYROXINE SODIUM 100 MCG PO TABS
100.0000 ug | ORAL_TABLET | Freq: Every day | ORAL | 3 refills | Status: DC
  Filled 2021-08-18 – 2021-09-04 (×2): qty 90, 90d supply, fill #0
  Filled 2021-12-05: qty 90, 90d supply, fill #1
  Filled 2022-02-28: qty 90, 90d supply, fill #2
  Filled 2022-05-28: qty 90, 90d supply, fill #3

## 2021-08-18 MED ORDER — METFORMIN HCL ER 500 MG PO TB24
ORAL_TABLET | ORAL | 3 refills | Status: DC
  Filled 2021-08-18: qty 180, 90d supply, fill #0
  Filled 2021-11-14: qty 180, 90d supply, fill #1
  Filled 2022-02-16: qty 180, 90d supply, fill #2
  Filled 2022-05-18: qty 180, 90d supply, fill #3

## 2021-08-29 ENCOUNTER — Other Ambulatory Visit: Payer: Self-pay

## 2021-09-04 ENCOUNTER — Other Ambulatory Visit: Payer: Self-pay

## 2021-09-04 MED ORDER — DESVENLAFAXINE SUCCINATE ER 100 MG PO TB24
100.0000 mg | ORAL_TABLET | Freq: Every day | ORAL | 0 refills | Status: DC
  Filled 2021-09-04: qty 90, 90d supply, fill #0

## 2021-09-05 ENCOUNTER — Other Ambulatory Visit: Payer: Self-pay

## 2021-09-18 ENCOUNTER — Other Ambulatory Visit: Payer: Self-pay

## 2021-09-30 ENCOUNTER — Other Ambulatory Visit: Payer: Self-pay

## 2021-09-30 DIAGNOSIS — E669 Obesity, unspecified: Secondary | ICD-10-CM | POA: Diagnosis not present

## 2021-09-30 DIAGNOSIS — I10 Essential (primary) hypertension: Secondary | ICD-10-CM | POA: Diagnosis not present

## 2021-09-30 DIAGNOSIS — K760 Fatty (change of) liver, not elsewhere classified: Secondary | ICD-10-CM | POA: Diagnosis not present

## 2021-09-30 DIAGNOSIS — E119 Type 2 diabetes mellitus without complications: Secondary | ICD-10-CM | POA: Diagnosis not present

## 2021-09-30 DIAGNOSIS — I2584 Coronary atherosclerosis due to calcified coronary lesion: Secondary | ICD-10-CM | POA: Diagnosis not present

## 2021-09-30 MED ORDER — MOUNJARO 5 MG/0.5ML ~~LOC~~ SOAJ
SUBCUTANEOUS | 3 refills | Status: DC
  Filled 2021-09-30: qty 2, 28d supply, fill #0
  Filled 2021-10-17 – 2021-10-20 (×2): qty 2, 28d supply, fill #1
  Filled 2021-12-05 – 2021-12-18 (×2): qty 2, 28d supply, fill #2

## 2021-10-01 ENCOUNTER — Other Ambulatory Visit: Payer: Self-pay

## 2021-10-01 MED ORDER — ONDANSETRON 4 MG PO TBDP
ORAL_TABLET | ORAL | 0 refills | Status: DC
  Filled 2021-10-01: qty 30, 5d supply, fill #0

## 2021-10-07 ENCOUNTER — Other Ambulatory Visit: Payer: Self-pay

## 2021-10-07 MED ORDER — WEGOVY 1 MG/0.5ML ~~LOC~~ SOAJ
SUBCUTANEOUS | 0 refills | Status: DC
  Filled 2021-10-17 – 2021-11-14 (×2): qty 2, fill #0
  Filled 2021-12-05: qty 2, 28d supply, fill #0
  Filled ????-??-??: fill #0

## 2021-10-07 MED ORDER — WEGOVY 2.4 MG/0.75ML ~~LOC~~ SOAJ
SUBCUTANEOUS | 0 refills | Status: DC
  Filled 2021-10-17: qty 2, fill #0
  Filled 2021-11-14 – 2021-12-05 (×2): qty 3, fill #0
  Filled 2021-12-18 – 2022-01-06 (×2): qty 3, 28d supply, fill #0
  Filled ????-??-??: fill #0

## 2021-10-07 MED ORDER — WEGOVY 1.7 MG/0.75ML ~~LOC~~ SOAJ
SUBCUTANEOUS | 0 refills | Status: DC
  Filled 2021-10-17 – 2021-11-14 (×2): qty 2, fill #0
  Filled 2021-12-05: qty 2, 28d supply, fill #0
  Filled 2021-12-18 – 2022-01-27 (×3): qty 3, 28d supply, fill #0
  Filled ????-??-??: fill #0

## 2021-10-07 MED ORDER — WEGOVY 0.25 MG/0.5ML ~~LOC~~ SOAJ
SUBCUTANEOUS | 0 refills | Status: DC
  Filled 2021-10-07 – 2021-10-17 (×3): qty 2, 28d supply, fill #0
  Filled 2021-10-17: qty 2, fill #0
  Filled 2021-10-22 – 2021-10-24 (×3): qty 2, 28d supply, fill #0

## 2021-10-07 MED ORDER — WEGOVY 0.5 MG/0.5ML ~~LOC~~ SOAJ
SUBCUTANEOUS | 0 refills | Status: DC
  Filled 2021-10-17: qty 2, fill #0
  Filled 2021-11-14: qty 2, 28d supply, fill #0
  Filled ????-??-??: fill #0

## 2021-10-09 ENCOUNTER — Ambulatory Visit: Payer: 59 | Admitting: Psychiatry

## 2021-10-10 ENCOUNTER — Other Ambulatory Visit: Payer: Self-pay

## 2021-10-15 ENCOUNTER — Other Ambulatory Visit: Payer: Self-pay

## 2021-10-16 DIAGNOSIS — I10 Essential (primary) hypertension: Secondary | ICD-10-CM | POA: Diagnosis not present

## 2021-10-16 DIAGNOSIS — R7989 Other specified abnormal findings of blood chemistry: Secondary | ICD-10-CM | POA: Diagnosis not present

## 2021-10-16 DIAGNOSIS — E039 Hypothyroidism, unspecified: Secondary | ICD-10-CM | POA: Diagnosis not present

## 2021-10-16 DIAGNOSIS — E785 Hyperlipidemia, unspecified: Secondary | ICD-10-CM | POA: Diagnosis not present

## 2021-10-16 DIAGNOSIS — R82998 Other abnormal findings in urine: Secondary | ICD-10-CM | POA: Diagnosis not present

## 2021-10-16 DIAGNOSIS — E119 Type 2 diabetes mellitus without complications: Secondary | ICD-10-CM | POA: Diagnosis not present

## 2021-10-17 ENCOUNTER — Other Ambulatory Visit: Payer: Self-pay

## 2021-10-20 ENCOUNTER — Other Ambulatory Visit: Payer: Self-pay

## 2021-10-22 ENCOUNTER — Other Ambulatory Visit: Payer: Self-pay

## 2021-10-23 ENCOUNTER — Other Ambulatory Visit: Payer: Self-pay

## 2021-10-23 DIAGNOSIS — I10 Essential (primary) hypertension: Secondary | ICD-10-CM | POA: Diagnosis not present

## 2021-10-23 DIAGNOSIS — I2584 Coronary atherosclerosis due to calcified coronary lesion: Secondary | ICD-10-CM | POA: Diagnosis not present

## 2021-10-23 DIAGNOSIS — D860 Sarcoidosis of lung: Secondary | ICD-10-CM | POA: Diagnosis not present

## 2021-10-23 DIAGNOSIS — E119 Type 2 diabetes mellitus without complications: Secondary | ICD-10-CM | POA: Diagnosis not present

## 2021-10-23 DIAGNOSIS — E039 Hypothyroidism, unspecified: Secondary | ICD-10-CM | POA: Diagnosis not present

## 2021-10-23 DIAGNOSIS — Z0000 Encounter for general adult medical examination without abnormal findings: Secondary | ICD-10-CM | POA: Diagnosis not present

## 2021-10-23 DIAGNOSIS — E669 Obesity, unspecified: Secondary | ICD-10-CM | POA: Diagnosis not present

## 2021-10-23 DIAGNOSIS — I6523 Occlusion and stenosis of bilateral carotid arteries: Secondary | ICD-10-CM | POA: Diagnosis not present

## 2021-10-23 DIAGNOSIS — I700 Atherosclerosis of aorta: Secondary | ICD-10-CM | POA: Diagnosis not present

## 2021-10-24 ENCOUNTER — Other Ambulatory Visit: Payer: Self-pay

## 2021-11-03 ENCOUNTER — Other Ambulatory Visit: Payer: Self-pay

## 2021-11-14 ENCOUNTER — Other Ambulatory Visit: Payer: Self-pay

## 2021-11-14 MED ORDER — ZOLPIDEM TARTRATE 5 MG PO TABS
ORAL_TABLET | ORAL | 5 refills | Status: AC
  Filled 2021-11-14: qty 30, 30d supply, fill #0
  Filled 2021-12-18: qty 30, 30d supply, fill #1
  Filled 2022-01-19: qty 30, 30d supply, fill #2
  Filled 2022-02-16: qty 30, 30d supply, fill #3
  Filled 2022-03-18: qty 30, 30d supply, fill #4
  Filled 2022-04-06 – 2022-04-17 (×2): qty 30, 30d supply, fill #5

## 2021-11-18 ENCOUNTER — Other Ambulatory Visit: Payer: Self-pay

## 2021-11-19 ENCOUNTER — Other Ambulatory Visit: Payer: Self-pay

## 2021-11-19 MED ORDER — ALPRAZOLAM 0.25 MG PO TABS
ORAL_TABLET | ORAL | 4 refills | Status: DC
  Filled 2021-11-19: qty 60, 30d supply, fill #0
  Filled 2021-12-18 – 2021-12-23 (×2): qty 60, 30d supply, fill #1
  Filled 2022-01-27: qty 60, 30d supply, fill #2
  Filled 2022-02-25: qty 60, 30d supply, fill #3
  Filled 2022-04-06: qty 60, 30d supply, fill #4

## 2021-11-27 ENCOUNTER — Other Ambulatory Visit: Payer: Self-pay

## 2021-11-27 MED ORDER — DESVENLAFAXINE SUCCINATE ER 100 MG PO TB24
100.0000 mg | ORAL_TABLET | Freq: Every day | ORAL | 0 refills | Status: DC
  Filled 2021-11-27: qty 90, 90d supply, fill #0

## 2021-12-04 DIAGNOSIS — Z6829 Body mass index (BMI) 29.0-29.9, adult: Secondary | ICD-10-CM | POA: Diagnosis not present

## 2021-12-04 DIAGNOSIS — Z1151 Encounter for screening for human papillomavirus (HPV): Secondary | ICD-10-CM | POA: Diagnosis not present

## 2021-12-04 DIAGNOSIS — Z01419 Encounter for gynecological examination (general) (routine) without abnormal findings: Secondary | ICD-10-CM | POA: Diagnosis not present

## 2021-12-04 DIAGNOSIS — Z1272 Encounter for screening for malignant neoplasm of vagina: Secondary | ICD-10-CM | POA: Diagnosis not present

## 2021-12-05 ENCOUNTER — Other Ambulatory Visit: Payer: Self-pay

## 2021-12-18 ENCOUNTER — Other Ambulatory Visit: Payer: Self-pay

## 2021-12-19 ENCOUNTER — Other Ambulatory Visit: Payer: Self-pay

## 2021-12-23 ENCOUNTER — Other Ambulatory Visit: Payer: Self-pay

## 2021-12-24 ENCOUNTER — Other Ambulatory Visit: Payer: Self-pay

## 2022-01-06 ENCOUNTER — Other Ambulatory Visit: Payer: Self-pay

## 2022-01-06 MED ORDER — ZOLPIDEM TARTRATE 5 MG PO TABS
5.0000 mg | ORAL_TABLET | Freq: Every evening | ORAL | 5 refills | Status: DC | PRN
  Filled 2022-05-11: qty 30, fill #0
  Filled 2022-05-18: qty 30, 30d supply, fill #0

## 2022-01-07 ENCOUNTER — Other Ambulatory Visit: Payer: Self-pay

## 2022-01-07 DIAGNOSIS — E663 Overweight: Secondary | ICD-10-CM | POA: Diagnosis not present

## 2022-01-07 DIAGNOSIS — E119 Type 2 diabetes mellitus without complications: Secondary | ICD-10-CM | POA: Diagnosis not present

## 2022-01-07 DIAGNOSIS — K760 Fatty (change of) liver, not elsewhere classified: Secondary | ICD-10-CM | POA: Diagnosis not present

## 2022-01-07 DIAGNOSIS — I10 Essential (primary) hypertension: Secondary | ICD-10-CM | POA: Diagnosis not present

## 2022-01-07 DIAGNOSIS — I2584 Coronary atherosclerosis due to calcified coronary lesion: Secondary | ICD-10-CM | POA: Diagnosis not present

## 2022-01-07 DIAGNOSIS — J329 Chronic sinusitis, unspecified: Secondary | ICD-10-CM | POA: Diagnosis not present

## 2022-01-07 MED ORDER — ONDANSETRON 4 MG PO TBDP
ORAL_TABLET | ORAL | 0 refills | Status: DC
  Filled 2022-01-07: qty 30, 5d supply, fill #0

## 2022-01-07 MED ORDER — AZITHROMYCIN 250 MG PO TABS
ORAL_TABLET | ORAL | 0 refills | Status: AC
  Filled 2022-01-07: qty 6, 5d supply, fill #0

## 2022-01-07 MED ORDER — OZEMPIC (1 MG/DOSE) 4 MG/3ML ~~LOC~~ SOPN
PEN_INJECTOR | SUBCUTANEOUS | 5 refills | Status: AC
  Filled 2022-01-07 – 2022-01-29 (×3): qty 3, 28d supply, fill #0
  Filled 2022-02-25 – 2022-02-28 (×2): qty 3, 28d supply, fill #1
  Filled 2022-04-06: qty 3, 28d supply, fill #2
  Filled 2022-05-11: qty 3, 28d supply, fill #3

## 2022-01-19 ENCOUNTER — Other Ambulatory Visit: Payer: Self-pay

## 2022-01-27 ENCOUNTER — Other Ambulatory Visit: Payer: Self-pay

## 2022-01-28 ENCOUNTER — Other Ambulatory Visit: Payer: Self-pay

## 2022-01-28 MED ORDER — HYDROCHLOROTHIAZIDE 25 MG PO TABS
25.0000 mg | ORAL_TABLET | Freq: Every day | ORAL | 3 refills | Status: DC
  Filled 2022-01-28: qty 90, 90d supply, fill #0
  Filled 2022-04-29: qty 90, 90d supply, fill #1
  Filled 2022-08-31: qty 90, 90d supply, fill #2
  Filled 2022-11-28 – 2023-01-05 (×2): qty 90, 90d supply, fill #3

## 2022-01-29 ENCOUNTER — Other Ambulatory Visit: Payer: Self-pay

## 2022-02-03 ENCOUNTER — Other Ambulatory Visit: Payer: Self-pay

## 2022-02-16 ENCOUNTER — Other Ambulatory Visit: Payer: Self-pay

## 2022-02-25 ENCOUNTER — Other Ambulatory Visit: Payer: Self-pay

## 2022-02-25 MED ORDER — DESVENLAFAXINE SUCCINATE ER 100 MG PO TB24
100.0000 mg | ORAL_TABLET | Freq: Every day | ORAL | 12 refills | Status: DC
  Filled 2022-02-25: qty 90, 90d supply, fill #0
  Filled 2022-04-06 – 2022-05-28 (×3): qty 90, 90d supply, fill #1
  Filled 2022-08-31: qty 90, 90d supply, fill #2
  Filled 2022-11-28: qty 90, 90d supply, fill #3

## 2022-02-26 ENCOUNTER — Other Ambulatory Visit: Payer: Self-pay

## 2022-03-02 ENCOUNTER — Other Ambulatory Visit: Payer: Self-pay

## 2022-03-04 ENCOUNTER — Other Ambulatory Visit: Payer: Self-pay

## 2022-03-18 ENCOUNTER — Other Ambulatory Visit: Payer: Self-pay

## 2022-03-18 MED ORDER — GLUCOSE BLOOD VI STRP
ORAL_STRIP | 3 refills | Status: AC
  Filled 2022-03-18: qty 50, 90d supply, fill #0
  Filled 2022-08-04: qty 50, 90d supply, fill #1
  Filled 2022-11-28: qty 100, 90d supply, fill #1

## 2022-03-19 ENCOUNTER — Other Ambulatory Visit: Payer: Self-pay

## 2022-04-01 ENCOUNTER — Other Ambulatory Visit: Payer: Self-pay | Admitting: Endocrinology

## 2022-04-01 DIAGNOSIS — Z1231 Encounter for screening mammogram for malignant neoplasm of breast: Secondary | ICD-10-CM

## 2022-04-06 ENCOUNTER — Other Ambulatory Visit: Payer: Self-pay

## 2022-04-06 MED ORDER — ONDANSETRON 4 MG PO TBDP
4.0000 mg | ORAL_TABLET | ORAL | 12 refills | Status: AC | PRN
  Filled 2022-04-06: qty 30, 5d supply, fill #0
  Filled 2022-04-29: qty 30, 5d supply, fill #1
  Filled 2022-06-16: qty 30, 5d supply, fill #2
  Filled 2022-07-20: qty 30, 5d supply, fill #3
  Filled 2022-10-12: qty 30, 5d supply, fill #4
  Filled 2023-01-05: qty 30, 5d supply, fill #5
  Filled 2023-02-17: qty 30, 5d supply, fill #6
  Filled 2023-03-30: qty 30, 5d supply, fill #7

## 2022-04-17 ENCOUNTER — Other Ambulatory Visit: Payer: Self-pay

## 2022-04-17 MED ORDER — PROMETHAZINE-DM 6.25-15 MG/5ML PO SYRP
5.0000 mL | ORAL_SOLUTION | Freq: Four times a day (QID) | ORAL | 0 refills | Status: AC
  Filled 2022-04-17: qty 236, 12d supply, fill #0

## 2022-04-28 DIAGNOSIS — I6523 Occlusion and stenosis of bilateral carotid arteries: Secondary | ICD-10-CM | POA: Diagnosis not present

## 2022-04-28 DIAGNOSIS — I700 Atherosclerosis of aorta: Secondary | ICD-10-CM | POA: Diagnosis not present

## 2022-04-28 DIAGNOSIS — E669 Obesity, unspecified: Secondary | ICD-10-CM | POA: Diagnosis not present

## 2022-04-28 DIAGNOSIS — F330 Major depressive disorder, recurrent, mild: Secondary | ICD-10-CM | POA: Diagnosis not present

## 2022-04-28 DIAGNOSIS — E119 Type 2 diabetes mellitus without complications: Secondary | ICD-10-CM | POA: Diagnosis not present

## 2022-04-28 DIAGNOSIS — E785 Hyperlipidemia, unspecified: Secondary | ICD-10-CM | POA: Diagnosis not present

## 2022-04-28 DIAGNOSIS — I2584 Coronary atherosclerosis due to calcified coronary lesion: Secondary | ICD-10-CM | POA: Diagnosis not present

## 2022-04-28 DIAGNOSIS — Z853 Personal history of malignant neoplasm of breast: Secondary | ICD-10-CM | POA: Diagnosis not present

## 2022-04-28 DIAGNOSIS — I10 Essential (primary) hypertension: Secondary | ICD-10-CM | POA: Diagnosis not present

## 2022-04-28 DIAGNOSIS — E039 Hypothyroidism, unspecified: Secondary | ICD-10-CM | POA: Diagnosis not present

## 2022-04-29 ENCOUNTER — Other Ambulatory Visit: Payer: Self-pay

## 2022-04-29 MED ORDER — ALPRAZOLAM 0.25 MG PO TABS
0.2500 mg | ORAL_TABLET | Freq: Two times a day (BID) | ORAL | 4 refills | Status: DC | PRN
  Filled 2022-05-18: qty 60, 30d supply, fill #0
  Filled 2022-06-16: qty 60, 30d supply, fill #1
  Filled 2022-08-04: qty 60, 30d supply, fill #2
  Filled 2022-10-12: qty 60, 30d supply, fill #3

## 2022-05-11 ENCOUNTER — Other Ambulatory Visit: Payer: Self-pay

## 2022-05-12 ENCOUNTER — Other Ambulatory Visit: Payer: Self-pay

## 2022-05-18 ENCOUNTER — Other Ambulatory Visit: Payer: Self-pay

## 2022-05-19 ENCOUNTER — Ambulatory Visit
Admission: RE | Admit: 2022-05-19 | Discharge: 2022-05-19 | Disposition: A | Discharge status: Home / Self Care | Payer: Commercial Managed Care - PPO | Source: Ambulatory Visit | Attending: Endocrinology | Admitting: Endocrinology

## 2022-05-19 DIAGNOSIS — Z1231 Encounter for screening mammogram for malignant neoplasm of breast: Secondary | ICD-10-CM | POA: Diagnosis not present

## 2022-05-28 ENCOUNTER — Other Ambulatory Visit: Payer: Self-pay

## 2022-05-28 MED ORDER — ZOLPIDEM TARTRATE 5 MG PO TABS
5.0000 mg | ORAL_TABLET | Freq: Every evening | ORAL | 5 refills | Status: DC | PRN
  Filled 2022-06-16: qty 30, 30d supply, fill #0
  Filled 2022-08-04: qty 30, 30d supply, fill #1

## 2022-05-28 MED ORDER — TRULICITY 0.75 MG/0.5ML ~~LOC~~ SOAJ
0.7500 mg | SUBCUTANEOUS | 3 refills | Status: AC
  Filled 2022-05-28: qty 2, 28d supply, fill #0
  Filled 2022-06-05 – 2022-06-16 (×2): qty 2, 28d supply, fill #1
  Filled 2022-08-04: qty 2, 28d supply, fill #2

## 2022-05-29 ENCOUNTER — Other Ambulatory Visit: Payer: Self-pay

## 2022-06-05 ENCOUNTER — Other Ambulatory Visit: Payer: Self-pay

## 2022-06-12 ENCOUNTER — Other Ambulatory Visit: Payer: Self-pay

## 2022-06-16 ENCOUNTER — Other Ambulatory Visit: Payer: Self-pay

## 2022-07-09 ENCOUNTER — Other Ambulatory Visit: Payer: Self-pay | Admitting: *Deleted

## 2022-07-09 DIAGNOSIS — I6523 Occlusion and stenosis of bilateral carotid arteries: Secondary | ICD-10-CM

## 2022-07-20 ENCOUNTER — Other Ambulatory Visit: Payer: Self-pay

## 2022-07-20 MED ORDER — ZOLPIDEM TARTRATE 5 MG PO TABS
5.0000 mg | ORAL_TABLET | Freq: Every evening | ORAL | 5 refills | Status: AC | PRN
  Filled 2022-07-20: qty 30, 30d supply, fill #0
  Filled 2022-08-17: qty 30, 30d supply, fill #1
  Filled 2022-09-16: qty 30, 30d supply, fill #2
  Filled 2022-10-15 (×2): qty 30, 30d supply, fill #3
  Filled 2022-11-15: qty 30, 30d supply, fill #4
  Filled 2022-11-28 – 2022-12-16 (×2): qty 30, 30d supply, fill #5

## 2022-07-21 ENCOUNTER — Other Ambulatory Visit: Payer: Self-pay

## 2022-07-21 MED ORDER — PANTOPRAZOLE SODIUM 40 MG PO TBEC
40.0000 mg | DELAYED_RELEASE_TABLET | Freq: Every morning | ORAL | 0 refills | Status: DC
  Filled 2022-07-21: qty 90, 90d supply, fill #0

## 2022-07-24 ENCOUNTER — Encounter: Payer: Self-pay | Admitting: Physician Assistant

## 2022-07-24 ENCOUNTER — Ambulatory Visit (HOSPITAL_COMMUNITY)
Admission: RE | Admit: 2022-07-24 | Discharge: 2022-07-24 | Disposition: A | Discharge status: Home / Self Care | Payer: Commercial Managed Care - PPO | Source: Ambulatory Visit | Attending: Vascular Surgery | Admitting: Vascular Surgery

## 2022-07-24 ENCOUNTER — Ambulatory Visit (INDEPENDENT_AMBULATORY_CARE_PROVIDER_SITE_OTHER): Payer: Commercial Managed Care - PPO | Admitting: Physician Assistant

## 2022-07-24 VITALS — BP 147/82 | HR 71 | Temp 97.7°F | Resp 14 | Ht 63.0 in | Wt 166.0 lb

## 2022-07-24 DIAGNOSIS — I6523 Occlusion and stenosis of bilateral carotid arteries: Secondary | ICD-10-CM

## 2022-07-24 NOTE — Progress Notes (Signed)
Office Note     CC:  follow up Requesting Provider:  Adrian Prince, MD  HPI: Tracy Barnes is a 67 y.o. (1956/02/07) female who presents for follow up of carotid artery stenosis. She has been dealing with episodes of dizziness, syncopal episodes, and vertigo like symptoms for many years. Fortunately today she reports that she has not had any recent events. She has undergone extensive workup for this and no clear etiology ever identified. She however is understandably very happy that she has no occurrences recently.   Pt denies any amaurosis fugax, speech difficulties, weakness, numbness, paralysis or clumsiness or facial droop.  She does report a headache 2 weeks ago and that she has had some " floaters" in both of her eyes since. She is scheduled for her eye check up in a couple weeks. She denies any pain in her legs on ambulation or rest pain. No tissue loss. She does report swelling in her legs from time to time but this is improved with elevation.    The pt is on a statin for cholesterol management.  The pt is on a daily aspirin.   Other AC:  none The pt is on diuretic for hypertension.   The pt is diabetic.   Tobacco hx:  former  Past Medical History:  Diagnosis Date   Cancer 02/19/2010   left breast   Chest pain    Diabetes mellitus    Dyspnea on exertion    GERD (gastroesophageal reflux disease)    Hx of radiation therapy 06/18/10 to 08/01/10   L breast   Hyperlipidemia    Hypertension    Personal history of chemotherapy 2011   LEFT lumpectomy w/ radiation and chemo   Personal history of radiation therapy 2011   LEFT lumpectomy w/ radiation   Sarcoidosis    Thyroid disease     Past Surgical History:  Procedure Laterality Date   ABDOMINAL HYSTERECTOMY     At 57 yrs of age. One ovary removed. Surgery due to fibroids per medical history form dated 02/25/10.   BREAST LUMPECTOMY Left 2010   LEFT lumpectomy w/ radiation   BREAST SURGERY  03/25/2010   left lumpectomy    DEEP AXILLARY SENTINEL NODE BIOPSY / EXCISION  03/25/2010   4/4 nodes negative   MEDIASTINOSCOPY N/A 12/27/2013   Procedure: MEDIASTINOSCOPY;  Surgeon: Loreli Slot, MD;  Location: [facility] OR;  Service: Thoracic;  Laterality: N/A;   PORT-A-CATH REMOVAL  06/05/2011   Procedure: MINOR REMOVAL PORT-A-CATH;  Surgeon: Ernestene Mention, MD;  Location: [facility];  Service: General;  Laterality: N/A;  right   PORTACATH PLACEMENT  04/18/2010   Dr. Claud Kelp   VIDEO BRONCHOSCOPY WITH ENDOBRONCHIAL ULTRASOUND N/A 12/27/2013   Procedure: VIDEO BRONCHOSCOPY WITH ENDOBRONCHIAL ULTRASOUND;  Surgeon: Loreli Slot, MD;  Location: [facility] [place] OR;  Service: Thoracic;  Laterality: N/A;    Social History   Socioeconomic History   Marital status: Widowed    Spouse name: Not on file   Number of children: Not on file   Years of education: Not on file   Highest education level: Not on file  Occupational History   Occupation: system analyst    Employer: [employer]  Tobacco Use   Smoking status: Former    Packs/day: 0.10    Years: 0.50    Additional pack years: 0.00    Total pack years: 0.05    Types: Cigarettes    Quit date: 04/20/1977    Years since  quitting: 45.2   Smokeless tobacco: Never  Substance and Sexual Activity   Alcohol use: No   Drug use: No   Sexual activity: Yes  Other Topics Concern   Not on file  Social History Narrative   Not on file   Social Determinants of Health   Financial Resource Strain: Not on file  Food Insecurity: Not on file  Transportation Needs: Not on file  Physical Activity: Not on file  Stress: Not on file  Social Connections: Not on file  Intimate Partner Violence: Not on file    Family History  Problem Relation Age of Onset   Cancer Mother        Cancer of bladder and brain per medical history form dated 02/25/10.   Heart disease Father    COPD Father    Aortic stenosis Father    Breast cancer Neg Hx     Current Outpatient  Medications  Medication Sig Dispense Refill   ALPRAZolam (XANAX) 0.25 MG tablet Take 0.25 mg by mouth at bedtime as needed.     ALPRAZolam (XANAX) 0.25 MG tablet Take 1 tablet (0.25 mg total) by mouth 2 (two) times daily as needed. caution med can cause sedation 60 tablet 4   aspirin EC 81 MG tablet Take 81 mg by mouth daily.     atorvastatin (LIPITOR) 20 MG tablet TAKE 1 TABLET BY MOUTH DAILY 90 tablet 3   clobetasol cream (TEMOVATE) 0.05 %   2   desvenlafaxine (PRISTIQ) 100 MG 24 hr tablet TAKE 1 TABLET BY MOUTH ONCE DAILY 90 tablet 12   Dulaglutide (TRULICITY) 0.75 MG/0.5ML SOPN Inject 0.75 mg into the skin once a week as directed. 2 mL 3   glucose blood test strip Use to check blood glucose two times weekly 100 each 3   hydrochlorothiazide (HYDRODIURIL) 25 MG tablet Take 1 tablet (25 mg total) by mouth daily. 90 tablet 3   levothyroxine (SYNTHROID) 100 MCG tablet TAKE 1 TABLET BY MOUTH ONCE DAILY 90 tablet 3   meclizine (ANTIVERT) 25 MG tablet Take 1 tablet (25 mg total) by mouth 3 (three) times daily as needed for dizziness. 30 tablet 1   metFORMIN (GLUCOPHAGE-XR) 500 MG 24 hr tablet TAKE 1 TABLET BY MOUTH EVERY MORNING AND 1 TABLET EVERY EVENING 180 tablet 3   ondansetron (ZOFRAN ODT) 4 MG disintegrating tablet Take 1 tablet (4 mg total) by mouth every 8 (eight) hours as needed for nausea or vomiting. 20 tablet 1   ondansetron (ZOFRAN-ODT) 4 MG disintegrating tablet Dissolve 1 tablet on the tongue and allow to dissolve every 4 hours as needed for nausea 30 tablet 12   pantoprazole (PROTONIX) 40 MG tablet Take 1 tablet (40 mg total) by mouth every morning 20 minutes before breakfast 90 tablet 0   TRUE METRIX BLOOD GLUCOSE TEST test strip   6   TRUEPLUS LANCETS 30G MISC   6   zolpidem (AMBIEN) 5 MG tablet TAKE 1 TABLET BY MOUTH AT BEDTIME AS NEEDED 30 tablet 2   zolpidem (AMBIEN) 5 MG tablet TAKE 1 TABLET BY MOUTH AT BEDTIME AS NEEDED 30 days 30 tablet 5   zolpidem (AMBIEN) 5 MG tablet Take  1 tablet (5 mg total) by mouth at bedtime as needed. 30 tablet 5   zolpidem (AMBIEN) 5 MG tablet Take 1 tablet (5 mg total) by mouth at bedtime as needed. 30 tablet 5   azithromycin (ZITHROMAX Z-PAK) 250 MG tablet 2 tablets today, then 1 tablet Orally daily 5  days 6 tablet 0   nitrofurantoin, macrocrystal-monohydrate, (MACROBID) 100 MG capsule Take 1 capsule by mouth 2 times a day until directed to stop 10 capsule 0   Semaglutide, 1 MG/DOSE, (OZEMPIC, 1 MG/DOSE,) 4 MG/3ML SOPN inject 1mg  Subcutaneous once a week 30 days 3 mL 5   No current facility-administered medications for this visit.    No Known Allergies   REVIEW OF SYSTEMS:   [X]  denotes positive finding, [ ]  denotes negative finding Cardiac  Comments:  Chest pain or chest pressure:    Shortness of breath upon exertion:    Short of breath when lying flat:    Irregular heart rhythm:        Vascular    Pain in calf, thigh, or hip brought on by ambulation:    Pain in feet at night that wakes you up from your sleep:     Blood clot in your veins:    Leg swelling:         Pulmonary    Oxygen at home:    Productive cough:     Wheezing:         Neurologic    Sudden weakness in arms or legs:     Sudden numbness in arms or legs:     Sudden onset of difficulty speaking or slurred speech:    Temporary loss of vision in one eye:     Problems with dizziness:         Gastrointestinal    Blood in stool:     Vomited blood:         Genitourinary    Burning when urinating:     Blood in urine:        Psychiatric    Major depression:         Hematologic    Bleeding problems:    Problems with blood clotting too easily:        Skin    Rashes or ulcers:        Constitutional    Fever or chills:      PHYSICAL EXAMINATION:  Vitals:   07/24/22 1016 07/24/22 1018  BP: 135/80 (!) 147/82  Pulse: 71 71  Resp: 14   Temp: 97.7 F (36.5 C)   TempSrc: Temporal   SpO2: 98%   Weight: 166 lb (75.3 kg)   Height: 5\' 3"  (1.6 m)      General:  WDWN in NAD; vital signs documented above Gait: Normal HENT: WNL, normocephalic Pulmonary: normal non-labored breathing , without wheezing Cardiac: regular HR, no carotid bruits Abdomen: soft Vascular Exam/Pulses: 2+ popliteal, DP and PT pulses. Feet warm and well perfused Extremities: without ischemic changes, without Gangrene , without cellulitis; without open wounds Musculoskeletal: no muscle wasting or atrophy  Neurologic: A&O X 3 Psychiatric:  The pt has Normal affect.   Non-Invasive Vascular Imaging:   VAS US Carotid Duplex: Summary:  Right Carotid: Velocities in the right ICA are consistent with a 40-59% stenosis.   Left Carotid: Velocities in the left ICA are consistent with a 1-39% stenosis.   Vertebrals: Bilateral vertebral arteries demonstrate antegrade flow.  Subclavians: Normal flow hemodynamics were seen in bilateral subclavian  arteries.    ASSESSMENT/PLAN:: 67 y.o. female here for follow up for carotid artery stenosis. She has no attributable symptoms to her carotid disease. She has had resolution of her prior dizziness, syncopal episodes and vertigo like symptoms.  - Her duplex today shows only slight increase in velocities in her right ICA  now putting her in the 40-59% stenosis range. Her left ICA remains 1-39%. Normal flow in the vertebral and subclavian arteries bilaterally  -continue Aspirin and statin -Follow up with repeat carotid duplex in 1 year   Tracy Congressorrina Lonnetta Kniskern, PA-C [facility] [phone]  Clinic MD:   Karin Lieuobins

## 2022-07-27 ENCOUNTER — Other Ambulatory Visit: Payer: Self-pay

## 2022-08-04 ENCOUNTER — Other Ambulatory Visit: Payer: Self-pay

## 2022-08-05 ENCOUNTER — Other Ambulatory Visit: Payer: Self-pay

## 2022-08-05 DIAGNOSIS — E119 Type 2 diabetes mellitus without complications: Secondary | ICD-10-CM | POA: Diagnosis not present

## 2022-08-05 DIAGNOSIS — I10 Essential (primary) hypertension: Secondary | ICD-10-CM | POA: Diagnosis not present

## 2022-08-05 DIAGNOSIS — K760 Fatty (change of) liver, not elsewhere classified: Secondary | ICD-10-CM | POA: Diagnosis not present

## 2022-08-05 DIAGNOSIS — I2584 Coronary atherosclerosis due to calcified coronary lesion: Secondary | ICD-10-CM | POA: Diagnosis not present

## 2022-08-05 MED ORDER — TRULICITY 1.5 MG/0.5ML ~~LOC~~ SOAJ
1.5000 mg | SUBCUTANEOUS | 5 refills | Status: AC
  Filled 2022-08-05 – 2022-08-14 (×2): qty 2, 28d supply, fill #0
  Filled 2022-08-31: qty 2, 28d supply, fill #1
  Filled 2022-10-12: qty 2, 28d supply, fill #2
  Filled 2022-11-15: qty 2, 28d supply, fill #3
  Filled 2022-11-28 – 2022-12-09 (×2): qty 2, 28d supply, fill #4
  Filled 2023-01-05: qty 2, 28d supply, fill #5

## 2022-08-06 DIAGNOSIS — H43393 Other vitreous opacities, bilateral: Secondary | ICD-10-CM | POA: Diagnosis not present

## 2022-08-06 DIAGNOSIS — H35433 Paving stone degeneration of retina, bilateral: Secondary | ICD-10-CM | POA: Diagnosis not present

## 2022-08-06 DIAGNOSIS — H3589 Other specified retinal disorders: Secondary | ICD-10-CM | POA: Diagnosis not present

## 2022-08-14 ENCOUNTER — Other Ambulatory Visit: Payer: Self-pay

## 2022-08-17 ENCOUNTER — Other Ambulatory Visit: Payer: Self-pay

## 2022-08-31 ENCOUNTER — Other Ambulatory Visit: Payer: Self-pay

## 2022-08-31 MED ORDER — METFORMIN HCL ER 500 MG PO TB24
ORAL_TABLET | Freq: Two times a day (BID) | ORAL | 3 refills | Status: DC
  Filled 2022-08-31: qty 180, 90d supply, fill #0
  Filled 2022-11-28 – 2023-03-02 (×3): qty 180, 90d supply, fill #1
  Filled 2023-08-09 – 2023-08-18 (×2): qty 180, 90d supply, fill #2

## 2022-08-31 MED ORDER — LEVOTHYROXINE SODIUM 100 MCG PO TABS
100.0000 ug | ORAL_TABLET | Freq: Every day | ORAL | 3 refills | Status: DC
  Filled 2022-08-31: qty 90, 90d supply, fill #0
  Filled 2022-11-15: qty 90, 90d supply, fill #1
  Filled 2023-03-02: qty 90, 90d supply, fill #2
  Filled 2023-05-24: qty 90, 90d supply, fill #3

## 2022-08-31 MED ORDER — ATORVASTATIN CALCIUM 20 MG PO TABS
20.0000 mg | ORAL_TABLET | Freq: Every day | ORAL | 3 refills | Status: DC
  Filled 2022-08-31: qty 90, 90d supply, fill #0
  Filled 2022-11-28 – 2023-03-02 (×3): qty 90, 90d supply, fill #1
  Filled 2023-05-24: qty 90, 90d supply, fill #2

## 2022-09-02 ENCOUNTER — Other Ambulatory Visit: Payer: Self-pay

## 2022-09-02 DIAGNOSIS — G4700 Insomnia, unspecified: Secondary | ICD-10-CM | POA: Diagnosis not present

## 2022-09-02 DIAGNOSIS — K760 Fatty (change of) liver, not elsewhere classified: Secondary | ICD-10-CM | POA: Diagnosis not present

## 2022-09-02 DIAGNOSIS — D860 Sarcoidosis of lung: Secondary | ICD-10-CM | POA: Diagnosis not present

## 2022-09-02 DIAGNOSIS — E039 Hypothyroidism, unspecified: Secondary | ICD-10-CM | POA: Diagnosis not present

## 2022-09-02 DIAGNOSIS — I2584 Coronary atherosclerosis due to calcified coronary lesion: Secondary | ICD-10-CM | POA: Diagnosis not present

## 2022-09-02 DIAGNOSIS — I700 Atherosclerosis of aorta: Secondary | ICD-10-CM | POA: Diagnosis not present

## 2022-09-02 DIAGNOSIS — E119 Type 2 diabetes mellitus without complications: Secondary | ICD-10-CM | POA: Diagnosis not present

## 2022-09-02 DIAGNOSIS — E785 Hyperlipidemia, unspecified: Secondary | ICD-10-CM | POA: Diagnosis not present

## 2022-09-02 DIAGNOSIS — E669 Obesity, unspecified: Secondary | ICD-10-CM | POA: Diagnosis not present

## 2022-09-02 DIAGNOSIS — I10 Essential (primary) hypertension: Secondary | ICD-10-CM | POA: Diagnosis not present

## 2022-09-03 ENCOUNTER — Other Ambulatory Visit: Payer: Self-pay

## 2022-09-07 ENCOUNTER — Other Ambulatory Visit: Payer: Self-pay

## 2022-09-15 ENCOUNTER — Other Ambulatory Visit: Payer: Self-pay

## 2022-09-16 ENCOUNTER — Other Ambulatory Visit: Payer: Self-pay

## 2022-09-21 DIAGNOSIS — E119 Type 2 diabetes mellitus without complications: Secondary | ICD-10-CM | POA: Diagnosis not present

## 2022-09-21 DIAGNOSIS — H2513 Age-related nuclear cataract, bilateral: Secondary | ICD-10-CM | POA: Diagnosis not present

## 2022-09-21 DIAGNOSIS — M3501 Sicca syndrome with keratoconjunctivitis: Secondary | ICD-10-CM | POA: Diagnosis not present

## 2022-09-21 DIAGNOSIS — H35433 Paving stone degeneration of retina, bilateral: Secondary | ICD-10-CM | POA: Diagnosis not present

## 2022-10-12 ENCOUNTER — Other Ambulatory Visit: Payer: Self-pay

## 2022-10-15 ENCOUNTER — Other Ambulatory Visit: Payer: Self-pay

## 2022-10-20 ENCOUNTER — Other Ambulatory Visit: Payer: Self-pay

## 2022-10-20 MED ORDER — ZOLPIDEM TARTRATE 5 MG PO TABS
5.0000 mg | ORAL_TABLET | Freq: Every evening | ORAL | 5 refills | Status: DC | PRN
  Filled 2022-11-28 – 2023-01-14 (×3): qty 30, 30d supply, fill #0
  Filled 2023-02-17: qty 30, 30d supply, fill #1
  Filled 2023-03-17: qty 30, 30d supply, fill #2

## 2022-10-28 ENCOUNTER — Other Ambulatory Visit: Payer: Self-pay

## 2022-10-28 ENCOUNTER — Emergency Department: Payer: Commercial Managed Care - PPO

## 2022-10-28 ENCOUNTER — Emergency Department
Admission: EM | Admit: 2022-10-28 | Discharge: 2022-10-28 | Discharge status: Against Medical Advice | Payer: Commercial Managed Care - PPO | Attending: Emergency Medicine | Admitting: Emergency Medicine

## 2022-10-28 DIAGNOSIS — W010XXA Fall on same level from slipping, tripping and stumbling without subsequent striking against object, initial encounter: Secondary | ICD-10-CM | POA: Insufficient documentation

## 2022-10-28 DIAGNOSIS — Z5321 Procedure and treatment not carried out due to patient leaving prior to being seen by health care provider: Secondary | ICD-10-CM | POA: Diagnosis not present

## 2022-10-28 DIAGNOSIS — Z043 Encounter for examination and observation following other accident: Secondary | ICD-10-CM | POA: Diagnosis not present

## 2022-10-28 DIAGNOSIS — M47812 Spondylosis without myelopathy or radiculopathy, cervical region: Secondary | ICD-10-CM | POA: Diagnosis not present

## 2022-10-28 DIAGNOSIS — I6782 Cerebral ischemia: Secondary | ICD-10-CM | POA: Diagnosis not present

## 2022-10-28 DIAGNOSIS — M4801 Spinal stenosis, occipito-atlanto-axial region: Secondary | ICD-10-CM | POA: Diagnosis not present

## 2022-10-28 DIAGNOSIS — S0990XA Unspecified injury of head, initial encounter: Secondary | ICD-10-CM | POA: Diagnosis not present

## 2022-10-28 DIAGNOSIS — G9389 Other specified disorders of brain: Secondary | ICD-10-CM | POA: Diagnosis not present

## 2022-10-28 DIAGNOSIS — M5030 Other cervical disc degeneration, unspecified cervical region: Secondary | ICD-10-CM | POA: Diagnosis not present

## 2022-10-28 LAB — CBC
HCT: 42.8 % (ref 36.0–46.0)
Hemoglobin: 14.4 g/dL (ref 12.0–15.0)
MCH: 27.6 pg (ref 26.0–34.0)
MCHC: 33.6 g/dL (ref 30.0–36.0)
MCV: 82 fL (ref 80.0–100.0)
Platelets: 248 10*3/uL (ref 150–400)
RBC: 5.22 MIL/uL — ABNORMAL HIGH (ref 3.87–5.11)
RDW: 13.3 % (ref 11.5–15.5)
WBC: 6.1 10*3/uL (ref 4.0–10.5)
nRBC: 0 % (ref 0.0–0.2)

## 2022-10-28 LAB — TROPONIN I (HIGH SENSITIVITY): Troponin I (High Sensitivity): 5 ng/L (ref <18)

## 2022-10-28 LAB — BASIC METABOLIC PANEL
Anion gap: 12 (ref 5–15)
BUN: 14 mg/dL (ref 8–23)
CO2: 21 mmol/L — ABNORMAL LOW (ref 22–32)
Calcium: 9.3 mg/dL (ref 8.9–10.3)
Chloride: 103 mmol/L (ref 98–111)
Creatinine, Ser: 0.62 mg/dL (ref 0.44–1.00)
GFR, Estimated: >60 mL/min (ref >60)
Glucose, Bld: 131 mg/dL — ABNORMAL HIGH (ref 70–99)
Potassium: 3.4 mmol/L — ABNORMAL LOW (ref 3.5–5.1)
Sodium: 136 mmol/L (ref 135–145)

## 2022-10-28 NOTE — ED Triage Notes (Signed)
Pt to ED via POV c/o head injury after a fall about ago. Pt reports she tripped on a nail in porch and fell, hitting left side of face and head. Pt does think she passed out for a few seconds and takes aspirin daily. Pt with knot on left side of scalp. No bleeding. Pt endorses dizziness from hitting head at this time. Denies CP, SOB, fevers

## 2022-11-03 ENCOUNTER — Other Ambulatory Visit: Payer: Self-pay | Admitting: Oncology

## 2022-11-03 DIAGNOSIS — Z1379 Encounter for other screening for genetic and chromosomal anomalies: Secondary | ICD-10-CM

## 2022-11-05 ENCOUNTER — Other Ambulatory Visit: Payer: Self-pay | Admitting: Oncology

## 2022-11-05 DIAGNOSIS — Z006 Encounter for examination for normal comparison and control in clinical research program: Secondary | ICD-10-CM

## 2022-11-16 ENCOUNTER — Other Ambulatory Visit
Admission: RE | Admit: 2022-11-16 | Discharge: 2022-11-16 | Disposition: A | Discharge status: Home / Self Care | Payer: Commercial Managed Care - PPO | Attending: Oncology | Admitting: Oncology

## 2022-11-16 ENCOUNTER — Other Ambulatory Visit: Payer: Self-pay

## 2022-11-16 DIAGNOSIS — Z006 Encounter for examination for normal comparison and control in clinical research program: Secondary | ICD-10-CM | POA: Insufficient documentation

## 2022-11-28 ENCOUNTER — Other Ambulatory Visit: Payer: Self-pay

## 2022-11-29 ENCOUNTER — Other Ambulatory Visit: Payer: Self-pay

## 2022-11-29 MED ORDER — ALPRAZOLAM 0.25 MG PO TABS
0.2500 mg | ORAL_TABLET | Freq: Two times a day (BID) | ORAL | 5 refills | Status: DC | PRN
  Filled 2022-11-29: qty 60, 30d supply, fill #0
  Filled 2023-01-05: qty 60, 30d supply, fill #1
  Filled 2023-02-17: qty 60, 30d supply, fill #2
  Filled 2023-03-02 – 2023-03-30 (×2): qty 60, 30d supply, fill #3
  Filled 2023-05-24: qty 60, 30d supply, fill #4

## 2022-11-30 ENCOUNTER — Other Ambulatory Visit: Payer: Self-pay

## 2022-12-08 ENCOUNTER — Other Ambulatory Visit: Payer: Self-pay

## 2022-12-16 ENCOUNTER — Other Ambulatory Visit: Payer: Self-pay

## 2022-12-31 DIAGNOSIS — E785 Hyperlipidemia, unspecified: Secondary | ICD-10-CM | POA: Diagnosis not present

## 2022-12-31 DIAGNOSIS — I10 Essential (primary) hypertension: Secondary | ICD-10-CM | POA: Diagnosis not present

## 2022-12-31 DIAGNOSIS — E039 Hypothyroidism, unspecified: Secondary | ICD-10-CM | POA: Diagnosis not present

## 2022-12-31 DIAGNOSIS — Z1212 Encounter for screening for malignant neoplasm of rectum: Secondary | ICD-10-CM | POA: Diagnosis not present

## 2023-01-05 ENCOUNTER — Other Ambulatory Visit: Payer: Self-pay

## 2023-01-05 MED ORDER — PANTOPRAZOLE SODIUM 40 MG PO TBEC
40.0000 mg | DELAYED_RELEASE_TABLET | Freq: Every morning | ORAL | 4 refills | Status: DC
  Filled 2023-01-05: qty 90, 90d supply, fill #0
  Filled 2023-03-02 – 2023-05-24 (×2): qty 90, 90d supply, fill #1
  Filled 2023-08-18 – 2023-10-27 (×2): qty 90, 90d supply, fill #2

## 2023-01-06 DIAGNOSIS — G4700 Insomnia, unspecified: Secondary | ICD-10-CM | POA: Diagnosis not present

## 2023-01-06 DIAGNOSIS — I10 Essential (primary) hypertension: Secondary | ICD-10-CM | POA: Diagnosis not present

## 2023-01-06 DIAGNOSIS — Z0000 Encounter for general adult medical examination without abnormal findings: Secondary | ICD-10-CM | POA: Diagnosis not present

## 2023-01-06 DIAGNOSIS — E039 Hypothyroidism, unspecified: Secondary | ICD-10-CM | POA: Diagnosis not present

## 2023-01-06 DIAGNOSIS — E669 Obesity, unspecified: Secondary | ICD-10-CM | POA: Diagnosis not present

## 2023-01-06 DIAGNOSIS — I2584 Coronary atherosclerosis due to calcified coronary lesion: Secondary | ICD-10-CM | POA: Diagnosis not present

## 2023-01-06 DIAGNOSIS — Z23 Encounter for immunization: Secondary | ICD-10-CM | POA: Diagnosis not present

## 2023-01-06 DIAGNOSIS — R82998 Other abnormal findings in urine: Secondary | ICD-10-CM | POA: Diagnosis not present

## 2023-01-06 DIAGNOSIS — G319 Degenerative disease of nervous system, unspecified: Secondary | ICD-10-CM | POA: Diagnosis not present

## 2023-01-06 DIAGNOSIS — I6523 Occlusion and stenosis of bilateral carotid arteries: Secondary | ICD-10-CM | POA: Diagnosis not present

## 2023-01-06 DIAGNOSIS — E119 Type 2 diabetes mellitus without complications: Secondary | ICD-10-CM | POA: Diagnosis not present

## 2023-01-06 DIAGNOSIS — K760 Fatty (change of) liver, not elsewhere classified: Secondary | ICD-10-CM | POA: Diagnosis not present

## 2023-01-08 ENCOUNTER — Other Ambulatory Visit: Payer: Self-pay

## 2023-01-14 ENCOUNTER — Other Ambulatory Visit: Payer: Self-pay

## 2023-01-21 ENCOUNTER — Other Ambulatory Visit: Payer: Self-pay

## 2023-01-21 DIAGNOSIS — E119 Type 2 diabetes mellitus without complications: Secondary | ICD-10-CM | POA: Diagnosis not present

## 2023-01-21 DIAGNOSIS — K760 Fatty (change of) liver, not elsewhere classified: Secondary | ICD-10-CM | POA: Diagnosis not present

## 2023-01-21 DIAGNOSIS — I10 Essential (primary) hypertension: Secondary | ICD-10-CM | POA: Diagnosis not present

## 2023-01-21 DIAGNOSIS — I2584 Coronary atherosclerosis due to calcified coronary lesion: Secondary | ICD-10-CM | POA: Diagnosis not present

## 2023-01-21 DIAGNOSIS — E669 Obesity, unspecified: Secondary | ICD-10-CM | POA: Diagnosis not present

## 2023-01-21 MED ORDER — TRULICITY 3 MG/0.5ML ~~LOC~~ SOAJ
3.0000 mg | SUBCUTANEOUS | 3 refills | Status: AC
Start: 2023-01-21 — End: ?
  Filled 2023-01-21: qty 6, 84d supply, fill #0
  Filled 2023-02-17 – 2023-02-19 (×2): qty 2, 28d supply, fill #0
  Filled 2023-02-26: qty 6, 84d supply, fill #0
  Filled 2023-02-26 – 2023-03-03 (×2): qty 2, 28d supply, fill #0

## 2023-01-25 ENCOUNTER — Other Ambulatory Visit (HOSPITAL_COMMUNITY): Payer: Self-pay

## 2023-01-29 DIAGNOSIS — R195 Other fecal abnormalities: Secondary | ICD-10-CM | POA: Diagnosis not present

## 2023-02-04 ENCOUNTER — Other Ambulatory Visit: Payer: Self-pay

## 2023-02-04 ENCOUNTER — Other Ambulatory Visit (HOSPITAL_COMMUNITY): Payer: Self-pay

## 2023-02-04 DIAGNOSIS — K5730 Diverticulosis of large intestine without perforation or abscess without bleeding: Secondary | ICD-10-CM | POA: Diagnosis not present

## 2023-02-04 DIAGNOSIS — E669 Obesity, unspecified: Secondary | ICD-10-CM | POA: Diagnosis not present

## 2023-02-04 DIAGNOSIS — K921 Melena: Secondary | ICD-10-CM | POA: Diagnosis not present

## 2023-02-04 DIAGNOSIS — E039 Hypothyroidism, unspecified: Secondary | ICD-10-CM | POA: Diagnosis not present

## 2023-02-04 DIAGNOSIS — Z860100 Personal history of colon polyps, unspecified: Secondary | ICD-10-CM | POA: Diagnosis not present

## 2023-02-04 DIAGNOSIS — K219 Gastro-esophageal reflux disease without esophagitis: Secondary | ICD-10-CM | POA: Diagnosis not present

## 2023-02-04 DIAGNOSIS — E782 Mixed hyperlipidemia: Secondary | ICD-10-CM | POA: Diagnosis not present

## 2023-02-04 MED ORDER — PANTOPRAZOLE SODIUM 40 MG PO TBEC
40.0000 mg | DELAYED_RELEASE_TABLET | Freq: Every day | ORAL | 4 refills | Status: AC
  Filled 2023-02-17 – 2023-03-30 (×2): qty 90, 90d supply, fill #0
  Filled 2023-08-09: qty 90, 90d supply, fill #1
  Filled 2023-12-29: qty 90, 90d supply, fill #2

## 2023-02-17 ENCOUNTER — Other Ambulatory Visit: Payer: Self-pay

## 2023-02-18 ENCOUNTER — Other Ambulatory Visit: Payer: Self-pay

## 2023-02-19 ENCOUNTER — Other Ambulatory Visit: Payer: Self-pay

## 2023-02-19 MED ORDER — TRULICITY 3 MG/0.5ML ~~LOC~~ SOAJ
3.0000 mg | SUBCUTANEOUS | 3 refills | Status: AC
Start: 2023-02-19 — End: ?
  Filled 2023-02-19 (×2): qty 2, 28d supply, fill #0
  Filled 2023-02-26: qty 6, 84d supply, fill #0
  Filled 2023-05-24: qty 2, 28d supply, fill #0
  Filled 2023-06-23 – 2023-07-16 (×4): qty 2, 28d supply, fill #1
  Filled 2023-08-09: qty 2, 28d supply, fill #2

## 2023-02-26 ENCOUNTER — Other Ambulatory Visit: Payer: Self-pay

## 2023-03-02 ENCOUNTER — Other Ambulatory Visit: Payer: Self-pay

## 2023-03-02 MED ORDER — DESVENLAFAXINE SUCCINATE ER 100 MG PO TB24
100.0000 mg | ORAL_TABLET | Freq: Every day | ORAL | 12 refills | Status: DC
  Filled 2023-03-02: qty 90, 90d supply, fill #0
  Filled 2023-03-30 – 2023-05-04 (×3): qty 90, 90d supply, fill #1
  Filled 2023-08-23: qty 90, 90d supply, fill #2
  Filled 2023-11-29: qty 90, 90d supply, fill #3
  Filled 2024-02-23: qty 90, 90d supply, fill #4

## 2023-03-02 MED ORDER — HYDROCHLOROTHIAZIDE 25 MG PO TABS
25.0000 mg | ORAL_TABLET | Freq: Every day | ORAL | 4 refills | Status: DC
  Filled 2023-03-02: qty 90, 90d supply, fill #0
  Filled 2023-05-24: qty 90, 90d supply, fill #1
  Filled 2023-08-23: qty 90, 90d supply, fill #2
  Filled 2023-11-29: qty 90, 90d supply, fill #3

## 2023-03-03 ENCOUNTER — Other Ambulatory Visit: Payer: Self-pay

## 2023-03-17 ENCOUNTER — Other Ambulatory Visit: Payer: Self-pay

## 2023-03-30 ENCOUNTER — Other Ambulatory Visit: Payer: Self-pay

## 2023-04-06 DIAGNOSIS — Z7984 Long term (current) use of oral hypoglycemic drugs: Secondary | ICD-10-CM | POA: Diagnosis not present

## 2023-04-06 DIAGNOSIS — H25813 Combined forms of age-related cataract, bilateral: Secondary | ICD-10-CM | POA: Diagnosis not present

## 2023-04-06 DIAGNOSIS — H25812 Combined forms of age-related cataract, left eye: Secondary | ICD-10-CM | POA: Diagnosis not present

## 2023-04-06 DIAGNOSIS — H2512 Age-related nuclear cataract, left eye: Secondary | ICD-10-CM | POA: Diagnosis not present

## 2023-04-06 DIAGNOSIS — Z7982 Long term (current) use of aspirin: Secondary | ICD-10-CM | POA: Diagnosis not present

## 2023-04-07 DIAGNOSIS — H25811 Combined forms of age-related cataract, right eye: Secondary | ICD-10-CM | POA: Diagnosis not present

## 2023-04-19 ENCOUNTER — Other Ambulatory Visit: Payer: Self-pay

## 2023-04-20 ENCOUNTER — Other Ambulatory Visit: Payer: Self-pay

## 2023-04-20 DIAGNOSIS — H25811 Combined forms of age-related cataract, right eye: Secondary | ICD-10-CM | POA: Diagnosis not present

## 2023-04-20 DIAGNOSIS — Z961 Presence of intraocular lens: Secondary | ICD-10-CM | POA: Diagnosis not present

## 2023-04-20 DIAGNOSIS — Z7982 Long term (current) use of aspirin: Secondary | ICD-10-CM | POA: Diagnosis not present

## 2023-04-20 DIAGNOSIS — Z9842 Cataract extraction status, left eye: Secondary | ICD-10-CM | POA: Diagnosis not present

## 2023-04-20 DIAGNOSIS — Z7984 Long term (current) use of oral hypoglycemic drugs: Secondary | ICD-10-CM | POA: Diagnosis not present

## 2023-04-20 DIAGNOSIS — H2589 Other age-related cataract: Secondary | ICD-10-CM | POA: Diagnosis not present

## 2023-04-20 MED ORDER — PREDNISOLONE ACETATE 1 % OP SUSP
1.0000 [drp] | Freq: Four times a day (QID) | OPHTHALMIC | 0 refills | Status: DC
  Filled 2023-04-20: qty 5, 25d supply, fill #0
  Filled 2023-08-09: qty 5, 25d supply, fill #1

## 2023-04-20 MED ORDER — OFLOXACIN 0.3 % OP SOLN
1.0000 [drp] | Freq: Four times a day (QID) | OPHTHALMIC | 0 refills | Status: AC
  Filled 2023-04-20: qty 5, 25d supply, fill #0

## 2023-04-22 ENCOUNTER — Other Ambulatory Visit: Payer: Self-pay

## 2023-04-25 ENCOUNTER — Other Ambulatory Visit: Payer: Self-pay

## 2023-04-26 ENCOUNTER — Other Ambulatory Visit: Payer: Self-pay

## 2023-04-27 ENCOUNTER — Other Ambulatory Visit: Payer: Self-pay

## 2023-04-27 ENCOUNTER — Other Ambulatory Visit (HOSPITAL_COMMUNITY): Payer: Self-pay

## 2023-04-27 MED ORDER — ZOLPIDEM TARTRATE 5 MG PO TABS
5.0000 mg | ORAL_TABLET | Freq: Every evening | ORAL | 5 refills | Status: AC | PRN
  Filled 2023-04-27: qty 30, 30d supply, fill #0
  Filled 2023-05-25 – 2023-05-26 (×2): qty 30, 30d supply, fill #1
  Filled 2023-06-23: qty 30, 30d supply, fill #2
  Filled 2023-07-27: qty 30, 30d supply, fill #3
  Filled 2023-08-23: qty 30, 30d supply, fill #4

## 2023-04-28 ENCOUNTER — Other Ambulatory Visit: Payer: Self-pay

## 2023-05-04 ENCOUNTER — Other Ambulatory Visit: Payer: Self-pay

## 2023-05-19 DIAGNOSIS — I2584 Coronary atherosclerosis due to calcified coronary lesion: Secondary | ICD-10-CM | POA: Diagnosis not present

## 2023-05-19 DIAGNOSIS — K760 Fatty (change of) liver, not elsewhere classified: Secondary | ICD-10-CM | POA: Diagnosis not present

## 2023-05-19 DIAGNOSIS — E669 Obesity, unspecified: Secondary | ICD-10-CM | POA: Diagnosis not present

## 2023-05-19 DIAGNOSIS — I10 Essential (primary) hypertension: Secondary | ICD-10-CM | POA: Diagnosis not present

## 2023-05-19 DIAGNOSIS — E119 Type 2 diabetes mellitus without complications: Secondary | ICD-10-CM | POA: Diagnosis not present

## 2023-05-24 ENCOUNTER — Other Ambulatory Visit: Payer: Self-pay

## 2023-05-24 MED ORDER — PROMETHAZINE-DM 6.25-15 MG/5ML PO SYRP
5.0000 mL | ORAL_SOLUTION | Freq: Every day | ORAL | 0 refills | Status: AC
  Filled 2023-05-24: qty 100, 20d supply, fill #0

## 2023-05-24 MED ORDER — AZITHROMYCIN 250 MG PO TABS
ORAL_TABLET | ORAL | 0 refills | Status: AC
  Filled 2023-05-24: qty 6, 5d supply, fill #0

## 2023-05-25 ENCOUNTER — Other Ambulatory Visit: Payer: Self-pay

## 2023-05-26 ENCOUNTER — Other Ambulatory Visit: Payer: Self-pay

## 2023-06-23 ENCOUNTER — Other Ambulatory Visit: Payer: Self-pay

## 2023-06-29 ENCOUNTER — Other Ambulatory Visit: Payer: Self-pay

## 2023-07-07 ENCOUNTER — Other Ambulatory Visit: Payer: Self-pay

## 2023-07-16 ENCOUNTER — Other Ambulatory Visit: Payer: Self-pay

## 2023-07-23 DIAGNOSIS — G319 Degenerative disease of nervous system, unspecified: Secondary | ICD-10-CM | POA: Diagnosis not present

## 2023-07-23 DIAGNOSIS — E119 Type 2 diabetes mellitus without complications: Secondary | ICD-10-CM | POA: Diagnosis not present

## 2023-07-23 DIAGNOSIS — D860 Sarcoidosis of lung: Secondary | ICD-10-CM | POA: Diagnosis not present

## 2023-07-23 DIAGNOSIS — I700 Atherosclerosis of aorta: Secondary | ICD-10-CM | POA: Diagnosis not present

## 2023-07-23 DIAGNOSIS — F330 Major depressive disorder, recurrent, mild: Secondary | ICD-10-CM | POA: Diagnosis not present

## 2023-07-23 DIAGNOSIS — I2584 Coronary atherosclerosis due to calcified coronary lesion: Secondary | ICD-10-CM | POA: Diagnosis not present

## 2023-07-23 DIAGNOSIS — E669 Obesity, unspecified: Secondary | ICD-10-CM | POA: Diagnosis not present

## 2023-07-23 DIAGNOSIS — M5090 Cervical disc disorder, unspecified, unspecified cervical region: Secondary | ICD-10-CM | POA: Diagnosis not present

## 2023-07-23 DIAGNOSIS — I10 Essential (primary) hypertension: Secondary | ICD-10-CM | POA: Diagnosis not present

## 2023-07-23 DIAGNOSIS — I6523 Occlusion and stenosis of bilateral carotid arteries: Secondary | ICD-10-CM | POA: Diagnosis not present

## 2023-07-23 DIAGNOSIS — G4700 Insomnia, unspecified: Secondary | ICD-10-CM | POA: Diagnosis not present

## 2023-07-23 DIAGNOSIS — E039 Hypothyroidism, unspecified: Secondary | ICD-10-CM | POA: Diagnosis not present

## 2023-07-27 ENCOUNTER — Other Ambulatory Visit: Payer: Self-pay

## 2023-07-28 ENCOUNTER — Other Ambulatory Visit: Payer: Self-pay

## 2023-07-28 MED ORDER — ALPRAZOLAM 0.25 MG PO TABS
0.2500 mg | ORAL_TABLET | Freq: Two times a day (BID) | ORAL | 5 refills | Status: DC | PRN
Start: 2023-07-27 — End: 2024-02-23
  Filled 2023-07-28: qty 60, 30d supply, fill #0
  Filled 2023-08-23 – 2023-10-27 (×2): qty 60, 30d supply, fill #1
  Filled 2023-11-29: qty 60, 30d supply, fill #2
  Filled 2023-12-29 (×2): qty 60, 30d supply, fill #3

## 2023-08-09 ENCOUNTER — Other Ambulatory Visit: Payer: Self-pay

## 2023-08-18 ENCOUNTER — Other Ambulatory Visit: Payer: Self-pay

## 2023-08-19 ENCOUNTER — Other Ambulatory Visit: Payer: Self-pay

## 2023-08-23 ENCOUNTER — Other Ambulatory Visit: Payer: Self-pay

## 2023-08-23 MED ORDER — ZOLPIDEM TARTRATE 5 MG PO TABS
5.0000 mg | ORAL_TABLET | Freq: Every evening | ORAL | 5 refills | Status: DC | PRN
  Filled 2023-08-27: qty 30, 30d supply, fill #0
  Filled 2023-09-24: qty 30, 30d supply, fill #1
  Filled 2023-10-27: qty 30, 30d supply, fill #2
  Filled 2023-11-29: qty 30, 30d supply, fill #3
  Filled 2023-12-29 (×2): qty 30, 30d supply, fill #4
  Filled 2024-01-26: qty 30, 30d supply, fill #5

## 2023-08-23 MED ORDER — LEVOTHYROXINE SODIUM 100 MCG PO TABS
100.0000 ug | ORAL_TABLET | Freq: Every day | ORAL | 3 refills | Status: AC
Start: 2023-08-23 — End: ?
  Filled 2023-08-23: qty 90, 90d supply, fill #0
  Filled 2023-11-29: qty 90, 90d supply, fill #1
  Filled 2024-02-23: qty 90, 90d supply, fill #2

## 2023-08-25 ENCOUNTER — Other Ambulatory Visit: Payer: Self-pay

## 2023-08-27 ENCOUNTER — Other Ambulatory Visit: Payer: Self-pay

## 2023-09-24 ENCOUNTER — Other Ambulatory Visit: Payer: Self-pay

## 2023-10-27 ENCOUNTER — Other Ambulatory Visit: Payer: Self-pay

## 2023-11-11 ENCOUNTER — Other Ambulatory Visit: Payer: Self-pay

## 2023-11-11 MED ORDER — TRULICITY 0.75 MG/0.5ML ~~LOC~~ SOAJ
0.7500 mg | SUBCUTANEOUS | 0 refills | Status: AC
  Filled 2023-11-12 – 2023-11-30 (×4): qty 2, 28d supply, fill #0

## 2023-11-11 MED ORDER — TRULICITY 1.5 MG/0.5ML ~~LOC~~ SOAJ
1.5000 mg | SUBCUTANEOUS | 5 refills | Status: AC
  Filled 2023-11-15 – 2023-12-29 (×3): qty 2, 28d supply, fill #0
  Filled 2024-01-20: qty 2, 28d supply, fill #1
  Filled 2024-02-23: qty 2, 28d supply, fill #2
  Filled 2024-05-09: qty 2, 28d supply, fill #3

## 2023-11-12 ENCOUNTER — Other Ambulatory Visit: Payer: Self-pay

## 2023-11-15 ENCOUNTER — Other Ambulatory Visit: Payer: Self-pay

## 2023-11-19 ENCOUNTER — Other Ambulatory Visit: Payer: Self-pay

## 2023-11-29 ENCOUNTER — Other Ambulatory Visit: Payer: Self-pay

## 2023-11-29 MED ORDER — ATORVASTATIN CALCIUM 20 MG PO TABS
20.0000 mg | ORAL_TABLET | Freq: Every day | ORAL | 3 refills | Status: AC
  Filled 2023-11-29: qty 90, 90d supply, fill #0

## 2023-11-30 ENCOUNTER — Other Ambulatory Visit: Payer: Self-pay

## 2023-12-01 DIAGNOSIS — L820 Inflamed seborrheic keratosis: Secondary | ICD-10-CM | POA: Diagnosis not present

## 2023-12-01 DIAGNOSIS — D224 Melanocytic nevi of scalp and neck: Secondary | ICD-10-CM | POA: Diagnosis not present

## 2023-12-01 DIAGNOSIS — L918 Other hypertrophic disorders of the skin: Secondary | ICD-10-CM | POA: Diagnosis not present

## 2023-12-01 DIAGNOSIS — L821 Other seborrheic keratosis: Secondary | ICD-10-CM | POA: Diagnosis not present

## 2023-12-01 DIAGNOSIS — L720 Epidermal cyst: Secondary | ICD-10-CM | POA: Diagnosis not present

## 2023-12-01 DIAGNOSIS — L814 Other melanin hyperpigmentation: Secondary | ICD-10-CM | POA: Diagnosis not present

## 2023-12-02 ENCOUNTER — Other Ambulatory Visit: Payer: Self-pay

## 2023-12-02 DIAGNOSIS — I10 Essential (primary) hypertension: Secondary | ICD-10-CM | POA: Diagnosis not present

## 2023-12-02 DIAGNOSIS — E119 Type 2 diabetes mellitus without complications: Secondary | ICD-10-CM | POA: Diagnosis not present

## 2023-12-02 DIAGNOSIS — E669 Obesity, unspecified: Secondary | ICD-10-CM | POA: Diagnosis not present

## 2023-12-02 DIAGNOSIS — I2584 Coronary atherosclerosis due to calcified coronary lesion: Secondary | ICD-10-CM | POA: Diagnosis not present

## 2023-12-02 DIAGNOSIS — K760 Fatty (change of) liver, not elsewhere classified: Secondary | ICD-10-CM | POA: Diagnosis not present

## 2023-12-02 MED ORDER — ONETOUCH VERIO VI STRP
ORAL_STRIP | Freq: Four times a day (QID) | 6 refills | Status: AC
  Filled 2023-12-02: qty 100, 25d supply, fill #0

## 2023-12-22 ENCOUNTER — Other Ambulatory Visit: Payer: Self-pay

## 2023-12-22 MED ORDER — PREDNISONE 10 MG PO TABS
ORAL_TABLET | ORAL | 0 refills | Status: AC
  Filled 2023-12-22: qty 9, 6d supply, fill #0

## 2023-12-29 ENCOUNTER — Other Ambulatory Visit: Payer: Self-pay

## 2024-01-17 DIAGNOSIS — E039 Hypothyroidism, unspecified: Secondary | ICD-10-CM | POA: Diagnosis not present

## 2024-01-17 DIAGNOSIS — Z1212 Encounter for screening for malignant neoplasm of rectum: Secondary | ICD-10-CM | POA: Diagnosis not present

## 2024-01-18 DIAGNOSIS — E119 Type 2 diabetes mellitus without complications: Secondary | ICD-10-CM | POA: Diagnosis not present

## 2024-01-18 DIAGNOSIS — E785 Hyperlipidemia, unspecified: Secondary | ICD-10-CM | POA: Diagnosis not present

## 2024-01-18 DIAGNOSIS — I10 Essential (primary) hypertension: Secondary | ICD-10-CM | POA: Diagnosis not present

## 2024-01-18 DIAGNOSIS — E039 Hypothyroidism, unspecified: Secondary | ICD-10-CM | POA: Diagnosis not present

## 2024-01-24 DIAGNOSIS — Z0000 Encounter for general adult medical examination without abnormal findings: Secondary | ICD-10-CM | POA: Diagnosis not present

## 2024-01-24 DIAGNOSIS — N200 Calculus of kidney: Secondary | ICD-10-CM | POA: Diagnosis not present

## 2024-01-24 DIAGNOSIS — I10 Essential (primary) hypertension: Secondary | ICD-10-CM | POA: Diagnosis not present

## 2024-01-24 DIAGNOSIS — E785 Hyperlipidemia, unspecified: Secondary | ICD-10-CM | POA: Diagnosis not present

## 2024-01-24 DIAGNOSIS — F330 Major depressive disorder, recurrent, mild: Secondary | ICD-10-CM | POA: Diagnosis not present

## 2024-01-24 DIAGNOSIS — D860 Sarcoidosis of lung: Secondary | ICD-10-CM | POA: Diagnosis not present

## 2024-01-24 DIAGNOSIS — E039 Hypothyroidism, unspecified: Secondary | ICD-10-CM | POA: Diagnosis not present

## 2024-01-24 DIAGNOSIS — K635 Polyp of colon: Secondary | ICD-10-CM | POA: Diagnosis not present

## 2024-01-24 DIAGNOSIS — Z23 Encounter for immunization: Secondary | ICD-10-CM | POA: Diagnosis not present

## 2024-01-24 DIAGNOSIS — I6523 Occlusion and stenosis of bilateral carotid arteries: Secondary | ICD-10-CM | POA: Diagnosis not present

## 2024-01-24 DIAGNOSIS — I2584 Coronary atherosclerosis due to calcified coronary lesion: Secondary | ICD-10-CM | POA: Diagnosis not present

## 2024-01-24 DIAGNOSIS — E119 Type 2 diabetes mellitus without complications: Secondary | ICD-10-CM | POA: Diagnosis not present

## 2024-01-24 DIAGNOSIS — Z853 Personal history of malignant neoplasm of breast: Secondary | ICD-10-CM | POA: Diagnosis not present

## 2024-01-26 ENCOUNTER — Other Ambulatory Visit: Payer: Self-pay

## 2024-01-28 ENCOUNTER — Other Ambulatory Visit: Payer: Self-pay | Admitting: Endocrinology

## 2024-01-28 DIAGNOSIS — Z1231 Encounter for screening mammogram for malignant neoplasm of breast: Secondary | ICD-10-CM

## 2024-02-09 ENCOUNTER — Ambulatory Visit
Admission: RE | Admit: 2024-02-09 | Discharge: 2024-02-09 | Disposition: A | Discharge status: Home / Self Care | Source: Ambulatory Visit | Attending: Endocrinology | Admitting: Endocrinology

## 2024-02-09 DIAGNOSIS — Z1231 Encounter for screening mammogram for malignant neoplasm of breast: Secondary | ICD-10-CM | POA: Insufficient documentation

## 2024-02-23 ENCOUNTER — Other Ambulatory Visit: Payer: Self-pay

## 2024-02-23 MED ORDER — ALPRAZOLAM 0.25 MG PO TABS
0.2500 mg | ORAL_TABLET | Freq: Two times a day (BID) | ORAL | 5 refills | Status: AC | PRN
  Filled 2024-03-21: qty 60, 30d supply, fill #0
  Filled 2024-04-17: qty 60, 30d supply, fill #1
  Filled 2024-05-09 – 2024-05-12 (×2): qty 60, 30d supply, fill #2
  Filled 2024-05-26: qty 60, 30d supply, fill #3

## 2024-02-23 MED ORDER — ZOLPIDEM TARTRATE 5 MG PO TABS
5.0000 mg | ORAL_TABLET | Freq: Every evening | ORAL | 5 refills | Status: AC | PRN
  Filled 2024-02-28: qty 30, 30d supply, fill #0
  Filled 2024-03-24 – 2024-03-27 (×2): qty 30, 30d supply, fill #1
  Filled 2024-04-27: qty 30, 30d supply, fill #2
  Filled 2024-05-26: qty 30, 30d supply, fill #3

## 2024-02-23 MED ORDER — METFORMIN HCL ER 500 MG PO TB24
ORAL_TABLET | ORAL | 3 refills | Status: AC
  Filled 2024-02-23 – 2024-04-17 (×3): qty 180, 90d supply, fill #0

## 2024-02-23 MED ORDER — PANTOPRAZOLE SODIUM 40 MG PO TBEC
40.0000 mg | DELAYED_RELEASE_TABLET | Freq: Every morning | ORAL | 4 refills | Status: AC
  Filled 2024-02-23: qty 90, 90d supply, fill #0

## 2024-02-24 ENCOUNTER — Other Ambulatory Visit: Payer: Self-pay

## 2024-02-24 MED ORDER — TRULICITY 1.5 MG/0.5ML ~~LOC~~ SOAJ
1.5000 mg | SUBCUTANEOUS | 5 refills | Status: AC
  Filled 2024-02-24 – 2024-03-21 (×2): qty 2, 28d supply, fill #0
  Filled 2024-04-17: qty 2, 28d supply, fill #1

## 2024-02-28 ENCOUNTER — Other Ambulatory Visit: Payer: Self-pay

## 2024-03-21 ENCOUNTER — Other Ambulatory Visit: Payer: Self-pay

## 2024-03-21 DIAGNOSIS — Z01419 Encounter for gynecological examination (general) (routine) without abnormal findings: Secondary | ICD-10-CM | POA: Diagnosis not present

## 2024-03-21 DIAGNOSIS — Z6832 Body mass index (BMI) 32.0-32.9, adult: Secondary | ICD-10-CM | POA: Diagnosis not present

## 2024-03-24 ENCOUNTER — Other Ambulatory Visit: Payer: Self-pay

## 2024-03-26 ENCOUNTER — Other Ambulatory Visit: Payer: Self-pay

## 2024-03-27 ENCOUNTER — Other Ambulatory Visit: Payer: Self-pay

## 2024-03-30 ENCOUNTER — Other Ambulatory Visit: Payer: Self-pay

## 2024-04-17 ENCOUNTER — Other Ambulatory Visit: Payer: Self-pay

## 2024-04-17 ENCOUNTER — Encounter (HOSPITAL_BASED_OUTPATIENT_CLINIC_OR_DEPARTMENT_OTHER): Payer: Self-pay | Admitting: Cardiovascular Disease

## 2024-04-18 ENCOUNTER — Other Ambulatory Visit: Payer: Self-pay | Admitting: *Deleted

## 2024-04-18 DIAGNOSIS — I498 Other specified cardiac arrhythmias: Secondary | ICD-10-CM

## 2024-04-18 DIAGNOSIS — R002 Palpitations: Secondary | ICD-10-CM

## 2024-04-18 DIAGNOSIS — R0602 Shortness of breath: Secondary | ICD-10-CM

## 2024-04-27 ENCOUNTER — Other Ambulatory Visit: Payer: Self-pay

## 2024-05-02 ENCOUNTER — Other Ambulatory Visit: Payer: Self-pay

## 2024-05-02 MED ORDER — BENZONATATE 100 MG PO CAPS
100.0000 mg | ORAL_CAPSULE | Freq: Three times a day (TID) | ORAL | 1 refills | Status: AC
  Filled 2024-05-02: qty 45, 15d supply, fill #0

## 2024-05-02 MED ORDER — AZITHROMYCIN 250 MG PO TABS
ORAL_TABLET | ORAL | 0 refills | Status: AC
  Filled 2024-05-02: qty 6, 5d supply, fill #0

## 2024-05-09 ENCOUNTER — Other Ambulatory Visit: Payer: Self-pay

## 2024-05-12 ENCOUNTER — Other Ambulatory Visit: Payer: Self-pay

## 2024-05-26 ENCOUNTER — Other Ambulatory Visit: Payer: Self-pay

## 2024-05-26 MED ORDER — DESVENLAFAXINE SUCCINATE ER 100 MG PO TB24
100.0000 mg | ORAL_TABLET | Freq: Every day | ORAL | 12 refills | Status: AC
  Filled 2024-05-26: qty 90, 90d supply, fill #0

## 2024-05-26 MED ORDER — HYDROCHLOROTHIAZIDE 25 MG PO TABS
25.0000 mg | ORAL_TABLET | Freq: Every day | ORAL | 4 refills | Status: AC
  Filled 2024-05-26: qty 90, 90d supply, fill #0

## 2024-06-19 ENCOUNTER — Ambulatory Visit (HOSPITAL_BASED_OUTPATIENT_CLINIC_OR_DEPARTMENT_OTHER): Admitting: Cardiovascular Disease
